# Patient Record
Sex: Female | Born: 1985 | Race: Black or African American | Hispanic: No | Marital: Single | Smoking: Former smoker
Health system: Southern US, Community
[De-identification: ages and names within clinical notes are randomized; demographics above are authoritative.]

## PROBLEM LIST (undated history)

## (undated) DIAGNOSIS — D219 Benign neoplasm of connective and other soft tissue, unspecified: Secondary | ICD-10-CM

## (undated) DIAGNOSIS — J45909 Unspecified asthma, uncomplicated: Secondary | ICD-10-CM

## (undated) DIAGNOSIS — F12188 Cannabis abuse with other cannabis-induced disorder: Secondary | ICD-10-CM

## (undated) DIAGNOSIS — G43909 Migraine, unspecified, not intractable, without status migrainosus: Secondary | ICD-10-CM

## (undated) HISTORY — DX: Benign neoplasm of connective and other soft tissue, unspecified: D21.9

## (undated) HISTORY — PX: ABDOMINAL HYSTERECTOMY: SHX81

## (undated) HISTORY — PX: NO PAST SURGERIES: SHX2092

## (undated) HISTORY — DX: Migraine, unspecified, not intractable, without status migrainosus: G43.909

## (undated) HISTORY — PX: OTHER SURGICAL HISTORY: SHX169

---

## 2014-06-17 ENCOUNTER — Emergency Department (HOSPITAL_COMMUNITY): Payer: Self-pay

## 2014-06-17 ENCOUNTER — Encounter (HOSPITAL_COMMUNITY): Payer: Self-pay | Admitting: Emergency Medicine

## 2014-06-17 ENCOUNTER — Emergency Department (HOSPITAL_COMMUNITY)
Admission: EM | Admit: 2014-06-17 | Discharge: 2014-06-17 | Disposition: A | Discharge status: Home / Self Care | Payer: Self-pay | Attending: Emergency Medicine | Admitting: Emergency Medicine

## 2014-06-17 DIAGNOSIS — D259 Leiomyoma of uterus, unspecified: Secondary | ICD-10-CM | POA: Insufficient documentation

## 2014-06-17 DIAGNOSIS — R112 Nausea with vomiting, unspecified: Secondary | ICD-10-CM

## 2014-06-17 DIAGNOSIS — Z3202 Encounter for pregnancy test, result negative: Secondary | ICD-10-CM | POA: Insufficient documentation

## 2014-06-17 DIAGNOSIS — F1290 Cannabis use, unspecified, uncomplicated: Secondary | ICD-10-CM

## 2014-06-17 DIAGNOSIS — F1210 Cannabis abuse, uncomplicated: Secondary | ICD-10-CM | POA: Insufficient documentation

## 2014-06-17 DIAGNOSIS — K2970 Gastritis, unspecified, without bleeding: Secondary | ICD-10-CM | POA: Insufficient documentation

## 2014-06-17 DIAGNOSIS — Z7289 Other problems related to lifestyle: Secondary | ICD-10-CM

## 2014-06-17 DIAGNOSIS — Z720 Tobacco use: Secondary | ICD-10-CM | POA: Insufficient documentation

## 2014-06-17 DIAGNOSIS — K209 Esophagitis, unspecified without bleeding: Secondary | ICD-10-CM

## 2014-06-17 DIAGNOSIS — Z789 Other specified health status: Secondary | ICD-10-CM

## 2014-06-17 DIAGNOSIS — F1010 Alcohol abuse, uncomplicated: Secondary | ICD-10-CM | POA: Insufficient documentation

## 2014-06-17 LAB — COMPREHENSIVE METABOLIC PANEL
ALT: 18 U/L (ref 0–35)
AST: 27 U/L (ref 0–37)
Albumin: 4.8 g/dL (ref 3.5–5.2)
Alkaline Phosphatase: 54 U/L (ref 39–117)
Anion gap: 9 (ref 5–15)
BUN: 10 mg/dL (ref 6–23)
CO2: 23 mmol/L (ref 19–32)
Calcium: 9.4 mg/dL (ref 8.4–10.5)
Chloride: 106 mEq/L (ref 96–112)
Creatinine, Ser: 0.89 mg/dL (ref 0.50–1.10)
GFR calc Af Amer: >90 mL/min (ref >90)
GFR calc non Af Amer: 88 mL/min — ABNORMAL LOW (ref >90)
Glucose, Bld: 108 mg/dL — ABNORMAL HIGH (ref 70–99)
Potassium: 4.1 mmol/L (ref 3.5–5.1)
Sodium: 138 mmol/L (ref 135–145)
Total Bilirubin: 0.5 mg/dL (ref 0.3–1.2)
Total Protein: 8.7 g/dL — ABNORMAL HIGH (ref 6.0–8.3)

## 2014-06-17 LAB — CBC WITH DIFFERENTIAL/PLATELET
Basophils Absolute: 0 10*3/uL (ref 0.0–0.1)
Basophils Relative: 1 % (ref 0–1)
Eosinophils Absolute: 0.1 10*3/uL (ref 0.0–0.7)
Eosinophils Relative: 1 % (ref 0–5)
HCT: 36.4 % (ref 36.0–46.0)
Hemoglobin: 12.3 g/dL (ref 12.0–15.0)
Lymphocytes Relative: 20 % (ref 12–46)
Lymphs Abs: 1.5 10*3/uL (ref 0.7–4.0)
MCH: 29.8 pg (ref 26.0–34.0)
MCHC: 33.8 g/dL (ref 30.0–36.0)
MCV: 88.1 fL (ref 78.0–100.0)
Monocytes Absolute: 0.6 10*3/uL (ref 0.1–1.0)
Monocytes Relative: 8 % (ref 3–12)
Neutro Abs: 5.3 10*3/uL (ref 1.7–7.7)
Neutrophils Relative %: 70 % (ref 43–77)
Platelets: 394 10*3/uL (ref 150–400)
RBC: 4.13 MIL/uL (ref 3.87–5.11)
RDW: 12.6 % (ref 11.5–15.5)
WBC: 7.5 10*3/uL (ref 4.0–10.5)

## 2014-06-17 LAB — LIPASE, BLOOD: Lipase: 18 U/L (ref 11–59)

## 2014-06-17 LAB — OCCULT BLOOD GASTRIC / DUODENUM (SPECIMEN CUP)
Occult Blood, Gastric: NEGATIVE
pH, Gastric: 2

## 2014-06-17 LAB — POC URINE PREG, ED: Preg Test, Ur: NEGATIVE

## 2014-06-17 MED ORDER — MORPHINE SULFATE 4 MG/ML IJ SOLN
4.0000 mg | Freq: Once | INTRAMUSCULAR | Status: AC
Start: 2014-06-17 — End: 2014-06-17
  Administered 2014-06-17: 4 mg via INTRAVENOUS
  Filled 2014-06-17: qty 1

## 2014-06-17 MED ORDER — METOCLOPRAMIDE HCL 5 MG/ML IJ SOLN
10.0000 mg | Freq: Once | INTRAMUSCULAR | Status: AC
  Administered 2014-06-17: 10 mg via INTRAVENOUS
  Filled 2014-06-17: qty 2

## 2014-06-17 MED ORDER — ONDANSETRON HCL 4 MG/2ML IJ SOLN
4.0000 mg | Freq: Once | INTRAMUSCULAR | Status: AC
  Administered 2014-06-17: 4 mg via INTRAVENOUS
  Filled 2014-06-17: qty 2

## 2014-06-17 MED ORDER — IOHEXOL 300 MG/ML  SOLN
50.0000 mL | Freq: Once | INTRAMUSCULAR | Status: AC | PRN
  Administered 2014-06-17: 50 mL via ORAL

## 2014-06-17 MED ORDER — GI COCKTAIL ~~LOC~~
30.0000 mL | Freq: Once | ORAL | Status: AC
  Administered 2014-06-17: 30 mL via ORAL
  Filled 2014-06-17: qty 30

## 2014-06-17 MED ORDER — HYDROMORPHONE HCL 1 MG/ML IJ SOLN: 1.0000 mg | Freq: Once | INTRAMUSCULAR | Status: DC

## 2014-06-17 MED ORDER — PANTOPRAZOLE SODIUM 40 MG PO TBEC
40.0000 mg | DELAYED_RELEASE_TABLET | Freq: Once | ORAL | Status: AC
  Administered 2014-06-17: 40 mg via ORAL
  Filled 2014-06-17: qty 1

## 2014-06-17 MED ORDER — IOHEXOL 300 MG/ML  SOLN: 50.0000 mL | Freq: Once | INTRAMUSCULAR | Status: DC | PRN

## 2014-06-17 MED ORDER — DIPHENHYDRAMINE HCL 50 MG/ML IJ SOLN
25.0000 mg | Freq: Once | INTRAMUSCULAR | Status: AC
  Administered 2014-06-17: 25 mg via INTRAVENOUS
  Filled 2014-06-17: qty 1

## 2014-06-17 MED ORDER — PROMETHAZINE HCL 25 MG PO TABS: 25.0000 mg | ORAL_TABLET | Freq: Four times a day (QID) | ORAL | Status: DC | PRN

## 2014-06-17 MED ORDER — TRAMADOL HCL 50 MG PO TABS: 50.0000 mg | ORAL_TABLET | Freq: Four times a day (QID) | ORAL | Status: DC | PRN

## 2014-06-17 MED ORDER — HYDROMORPHONE HCL 1 MG/ML IJ SOLN
1.0000 mg | Freq: Once | INTRAMUSCULAR | Status: AC
  Administered 2014-06-17: 1 mg via INTRAVENOUS
  Filled 2014-06-17: qty 1

## 2014-06-17 MED ORDER — IOHEXOL 300 MG/ML  SOLN
100.0000 mL | Freq: Once | INTRAMUSCULAR | Status: AC | PRN
  Administered 2014-06-17: 100 mL via INTRAVENOUS

## 2014-06-17 MED ORDER — FAMOTIDINE 20 MG PO TABS: 20.0000 mg | ORAL_TABLET | Freq: Two times a day (BID) | ORAL | Status: DC

## 2014-06-17 NOTE — ED Notes (Signed)
Per EMS: pt c/o LUQ abd pain, n/v x 5 hours, per pt stating dark red blood was noted in last couple of episodes. 20 g right hand 4 mg Zofran in route.

## 2014-06-17 NOTE — ED Provider Notes (Signed)
CSN: [id number]     Arrival date & time 06/17/14  1022 History   First MD Initiated Contact with Patient 06/17/14 1036     Chief Complaint  Patient presents with  . Abdominal Pain  . Nausea  . Vomiting     (Consider location/radiation/quality/duration/timing/severity/associated sxs/prior Treatment) HPI  Pt is a 28yo female presenting to ED via EMS from home with c/o LUQ pain that radiates throughout left side of abdomen, associated with nausea and vomiting x5 hours.  Pt states pain is constant, sharp and cramping, 7/10 at worst but causes pt to be tearful. Pt reports vomiting dark red blood in last few episodes of emesis. Has had over 10 episodes of vomiting. Denies diarrhea. Denies fever but reports chills and  Decreased appetite.  Pt was given 4mg  IV zofran PTA, which has improved her nausea. Denies hx of similar symptoms. Denies hx of abdominal surgeries. Pt does report drinking alcohol last night but states she had "just 1 shot" and smokes marijuana daily.   Denies urinary or vaginal symptoms.   History reviewed. No pertinent past medical history. History reviewed. No pertinent past surgical history. No family history on file. History  Substance Use Topics  . Smoking status: Current Every Day Smoker  . Smokeless tobacco: Not on file  . Alcohol Use: Yes   OB History    No data available     Review of Systems  Constitutional: Positive for chills and appetite change. Negative for fever, diaphoresis and fatigue.  Gastrointestinal: Positive for nausea, vomiting and abdominal pain. Negative for diarrhea and constipation.  All other systems reviewed and are negative.     Allergies  Shrimp  Home Medications   Prior to Admission medications   Medication Sig Start Date End Date Taking? Authorizing Provider  famotidine (PEPCID) 20 MG tablet Take 1 tablet (20 mg total) by mouth 2 (two) times daily. For up to 12 weeks 06/17/14   Noland Fordyce, PA-C  promethazine (PHENERGAN) 25 MG  tablet Take 1 tablet (25 mg total) by mouth every 6 (six) hours as needed for nausea or vomiting. 06/17/14   Noland Fordyce, PA-C  traMADol (ULTRAM) 50 MG tablet Take 1 tablet (50 mg total) by mouth every 6 (six) hours as needed. 06/17/14   Noland Fordyce, PA-C   BP 111/56 mmHg  Pulse 66  Temp(Src) 98.5 F (36.9 C) (Oral)  Resp 16  SpO2 98%  LMP 06/13/2014 (Exact Date) Physical Exam  Constitutional: She appears well-developed and well-nourished. She appears distressed.  Pt lying on right side in fetal position, holding left side of abdomen, pt tearful. Appears in severe pain.  HENT:  Head: Normocephalic and atraumatic.  Eyes: Conjunctivae are normal. No scleral icterus.  Neck: Normal range of motion.  Cardiovascular: Normal rate, regular rhythm and normal heart sounds.   Pulmonary/Chest: Effort normal and breath sounds normal. No respiratory distress. She has no wheezes. She has no rales. She exhibits no tenderness.  Abdominal: Soft. Bowel sounds are normal. She exhibits no distension and no mass. There is tenderness. There is rebound and guarding. There is no CVA tenderness.  Soft, non-distended, tenderness to left side of abdomen into suprapubic region   Musculoskeletal: Normal range of motion.  Neurological: She is alert.  Skin: Skin is warm and dry. She is not diaphoretic.  Nursing note and vitals reviewed.   ED Course  Procedures (including critical care time) Labs Review Labs Reviewed  COMPREHENSIVE METABOLIC PANEL - Abnormal; Notable for the following:  Glucose, Bld 108 (*)    Total Protein 8.7 (*)    GFR calc non Af Amer 88 (*)    All other components within normal limits  CBC WITH DIFFERENTIAL  LIPASE, BLOOD  OCCULT BLOOD GASTRIC / DUODENUM (SPECIMEN CUP)  POC URINE PREG, ED    Imaging Review Ct Abdomen Pelvis W Contrast  06/17/2014   CLINICAL DATA:  Left upper quadrant abdominal pain, nausea, vomiting. Symptoms for 5 hours.  EXAM: CT ABDOMEN AND PELVIS WITH  CONTRAST  TECHNIQUE: Multidetector CT imaging of the abdomen and pelvis was performed using the standard protocol following bolus administration of intravenous contrast.  CONTRAST:  161mL OMNIPAQUE IOHEXOL 300 MG/ML  SOLN  COMPARISON:  None.  FINDINGS: The included lung bases are clear.  The liver, spleen, pancreas, adrenal glands, and kidneys appear normal. Small splenule noted adjacent to the inferior spleen. The gallbladder is physiologically distended, questionable sludge dependently. No calcified stones. No pericholecystic edema.  Equivocal wall thickening at the gastroesophageal junction. Stomach is otherwise physiologically distended with ingested enteric contrast. There are no dilated or thickened bowel loops. The appendix is normal. No free air, free fluid, or intra-abdominal fluid collection. Small fat containing periumbilical hernia.  The abdominal aorta is normal in caliber. There is no retroperitoneal adenopathy.  Within the pelvis, the uterus is mildly enlarged with an anterior fundal 3.3 x 2.5 centimeter fibroid. There is a probable partially exophytic posterior fundal fibroid measuring 2.7 x 2.7 cm. No adnexal mass. No significant pelvic free fluid. The urinary bladder is decompressed.  The osseous structures are normal.  IMPRESSION: 1. Equivocal thickening at the gastroesophageal junction, otherwise no acute abnormality in the abdomen/pelvis. This may reflect nondistention versus esophagitis. 2. Mildly enlarged uterus with uterine fibroids. 3. Question sludge in the gallbladder.   Electronically Signed   By: Jeb Levering M.D.   On: 06/17/2014 12:43     EKG Interpretation None      MDM   Final diagnoses:  Gastritis  Esophagitis  Nausea and vomiting in adult patient  Alcohol use  Marijuana use  Uterine leiomyoma, unspecified location   Pt is a 28yo female presenting to ED c/o n/v and abdominal pain that started this morning. Associated with a few episodes of hematemesis.  Pt  appears uncomfortable on exam, severe tenderness in epigastrium and LUQ radiating down into left side of abdomen. Pt denies urinary or vaginal symptoms.  CT ordered due to severity of pain.  Pt is afebrile. No vomiting after pt given zofran from EMS PTA.  12:52 PM CT abd: significant for equivocal thickening at the gastroesophageal junction, otherwise no acute abnormality of abdomen/pelvis.  Mildly enlarged uterus with uterine fibroids. Question sludge in gallbladder.    Labs: unremarkable, LFTs are not elevated  12:59 PM Discussed imaging with pt, pt had 1 episode of blood tinged emesis after CT contrast consumed, otherwise, no addition episodes. Pt still c/o severe LUQ and left sided abdominal pain.  Tearful. States pain medication, morphine did not help pain.   Discussed pt with Dr. Leonides Schanz, agrees with plan to tx pt for esophagitis, gastritis. No evidence of emergent process taking place at this time.   2:01 PM after given GI cocktail as well as protonix, pt still c/o severe LUQ pain. States none of the pain medication has helped. No vomiting as nausea has resolved.   2:25 PM Pt received dilaudid IV, RN states pt gave her a "thumbs up" after medication was given. Due to symptoms likely being due  to gastritis, will also give reglan to help with pain and nausea. As pt has been able to keep down PO fluids in ED, will discharge home with phenergan, pepcid, and tramadol. Will advise against NSAIDs until symptoms resolve. Also advised against consumptoms of etoh and marijuana as both may exacerbate her symptoms. Encouraged pt to f/u with PCP for recheck of symptoms in 2-3 days if not improving. Return precautions provided. Pt verbalized understanding and agreement with tx plan.     Noland Fordyce, PA-C 06/17/14 [place], DO 06/17/14 1550

## 2014-06-17 NOTE — ED Notes (Signed)
Bed: WA09 Expected date:  Expected time:  Means of arrival:  Comments: EMS- abdominal pain, n/v/d

## 2014-06-17 NOTE — ED Notes (Signed)
Given pt a cup of ice water.

## 2014-07-13 ENCOUNTER — Encounter (HOSPITAL_COMMUNITY): Payer: Self-pay | Admitting: *Deleted

## 2014-07-13 ENCOUNTER — Emergency Department (HOSPITAL_COMMUNITY)
Admission: EM | Admit: 2014-07-13 | Discharge: 2014-07-13 | Disposition: A | Discharge status: Home / Self Care | Payer: Self-pay | Attending: Emergency Medicine | Admitting: Emergency Medicine

## 2014-07-13 DIAGNOSIS — Z79899 Other long term (current) drug therapy: Secondary | ICD-10-CM | POA: Insufficient documentation

## 2014-07-13 DIAGNOSIS — Z720 Tobacco use: Secondary | ICD-10-CM | POA: Insufficient documentation

## 2014-07-13 DIAGNOSIS — R1084 Generalized abdominal pain: Secondary | ICD-10-CM | POA: Insufficient documentation

## 2014-07-13 DIAGNOSIS — R112 Nausea with vomiting, unspecified: Secondary | ICD-10-CM | POA: Insufficient documentation

## 2014-07-13 DIAGNOSIS — R197 Diarrhea, unspecified: Secondary | ICD-10-CM | POA: Insufficient documentation

## 2014-07-13 DIAGNOSIS — Z3202 Encounter for pregnancy test, result negative: Secondary | ICD-10-CM | POA: Insufficient documentation

## 2014-07-13 LAB — COMPREHENSIVE METABOLIC PANEL
ALBUMIN: 4.7 g/dL (ref 3.5–5.2)
ALK PHOS: 54 U/L (ref 39–117)
ALT: 24 U/L (ref 0–35)
AST: 38 U/L — AB (ref 0–37)
Anion gap: 9 (ref 5–15)
BUN: 11 mg/dL (ref 6–23)
CO2: 22 mmol/L (ref 19–32)
CREATININE: 0.96 mg/dL (ref 0.50–1.10)
Calcium: 9.6 mg/dL (ref 8.4–10.5)
Chloride: 111 mEq/L (ref 96–112)
GFR, EST NON AFRICAN AMERICAN: 80 mL/min — AB (ref >90)
Glucose, Bld: 126 mg/dL — ABNORMAL HIGH (ref 70–99)
Potassium: 3.8 mmol/L (ref 3.5–5.1)
SODIUM: 142 mmol/L (ref 135–145)
Total Bilirubin: 0.7 mg/dL (ref 0.3–1.2)
Total Protein: 9 g/dL — ABNORMAL HIGH (ref 6.0–8.3)

## 2014-07-13 LAB — URINALYSIS, ROUTINE W REFLEX MICROSCOPIC
Bilirubin Urine: NEGATIVE
Glucose, UA: NEGATIVE mg/dL
Ketones, ur: 40 mg/dL — AB
Leukocytes, UA: NEGATIVE
NITRITE: NEGATIVE
Protein, ur: 30 mg/dL — AB
Specific Gravity, Urine: 1.027 (ref 1.005–1.030)
Urobilinogen, UA: 0.2 mg/dL (ref 0.0–1.0)
pH: 6 (ref 5.0–8.0)

## 2014-07-13 LAB — URINE MICROSCOPIC-ADD ON

## 2014-07-13 LAB — CBC WITH DIFFERENTIAL/PLATELET
Basophils Absolute: 0 10*3/uL (ref 0.0–0.1)
Basophils Relative: 0 % (ref 0–1)
EOS PCT: 0 % (ref 0–5)
Eosinophils Absolute: 0 10*3/uL (ref 0.0–0.7)
HEMATOCRIT: 36.9 % (ref 36.0–46.0)
Hemoglobin: 12.5 g/dL (ref 12.0–15.0)
LYMPHS PCT: 12 % (ref 12–46)
Lymphs Abs: 1.2 10*3/uL (ref 0.7–4.0)
MCH: 29.6 pg (ref 26.0–34.0)
MCHC: 33.9 g/dL (ref 30.0–36.0)
MCV: 87.4 fL (ref 78.0–100.0)
MONO ABS: 0.5 10*3/uL (ref 0.1–1.0)
Monocytes Relative: 5 % (ref 3–12)
NEUTROS PCT: 83 % — AB (ref 43–77)
Neutro Abs: 8.6 10*3/uL — ABNORMAL HIGH (ref 1.7–7.7)
Platelets: 372 10*3/uL (ref 150–400)
RBC: 4.22 MIL/uL (ref 3.87–5.11)
RDW: 12.6 % (ref 11.5–15.5)
WBC: 10.3 10*3/uL (ref 4.0–10.5)

## 2014-07-13 LAB — POC URINE PREG, ED: PREG TEST UR: NEGATIVE

## 2014-07-13 LAB — LIPASE, BLOOD: Lipase: 21 U/L (ref 11–59)

## 2014-07-13 MED ORDER — HYDROCODONE-ACETAMINOPHEN 5-325 MG PO TABS: 1.0000 | ORAL_TABLET | Freq: Four times a day (QID) | ORAL | Status: DC | PRN

## 2014-07-13 MED ORDER — LORAZEPAM 2 MG/ML IJ SOLN
1.0000 mg | Freq: Once | INTRAMUSCULAR | Status: AC
  Administered 2014-07-13: 1 mg via INTRAVENOUS
  Filled 2014-07-13: qty 1

## 2014-07-13 MED ORDER — SODIUM CHLORIDE 0.9 % IV BOLUS (SEPSIS)
1000.0000 mL | Freq: Once | INTRAVENOUS | Status: AC
  Administered 2014-07-13: 1000 mL via INTRAVENOUS

## 2014-07-13 MED ORDER — ONDANSETRON HCL 4 MG/2ML IJ SOLN
4.0000 mg | Freq: Once | INTRAMUSCULAR | Status: AC
  Administered 2014-07-13: 4 mg via INTRAVENOUS
  Filled 2014-07-13: qty 2

## 2014-07-13 MED ORDER — HYDROMORPHONE HCL 1 MG/ML IJ SOLN
1.0000 mg | Freq: Once | INTRAMUSCULAR | Status: AC
  Administered 2014-07-13: 1 mg via INTRAVENOUS
  Filled 2014-07-13: qty 1

## 2014-07-13 MED ORDER — HYDROMORPHONE HCL 1 MG/ML IJ SOLN
1.0000 mg | Freq: Once | INTRAMUSCULAR | Status: AC
Start: 2014-07-13 — End: 2014-07-13
  Administered 2014-07-13: 1 mg via INTRAVENOUS
  Filled 2014-07-13: qty 1

## 2014-07-13 NOTE — ED Notes (Signed)
Pt reports n/v/d this am, severe abd pain. Seen in Dec for similar symptoms.

## 2014-07-13 NOTE — ED Provider Notes (Signed)
CSN: [id number]     Arrival date & time 07/13/14  1352 History   First MD Initiated Contact with Patient 07/13/14 1504     Chief Complaint  Patient presents with  . Nausea  . Emesis  . Abdominal Pain  . Diarrhea     (Consider location/radiation/quality/duration/timing/severity/associated sxs/prior Treatment) Patient is a 29 y.o. female presenting with vomiting, abdominal pain, and diarrhea. The history is provided by the patient.  Emesis Severity:  Mild Timing:  Constant Number of daily episodes:  2 Quality:  Stomach contents Progression:  Worsening Chronicity:  Recurrent (same thing happened last month around her menstrual cycle) Recent urination:  Normal Relieved by:  Nothing Worsened by:  Nothing tried Associated symptoms: abdominal pain and diarrhea   Associated symptoms: no cough   Risk factors: no alcohol use, no diabetes and not pregnant now   Abdominal Pain Associated symptoms: diarrhea and vomiting   Associated symptoms: no chest pain, no cough, no fever and no shortness of breath   Diarrhea Associated symptoms: abdominal pain and vomiting   Associated symptoms: no recent cough and no fever     History reviewed. No pertinent past medical history. History reviewed. No pertinent past surgical history. No family history on file. History  Substance Use Topics  . Smoking status: Current Every Day Smoker  . Smokeless tobacco: Not on file  . Alcohol Use: Yes     Comment: 5 shots liquor last night   OB History    No data available     Review of Systems  Constitutional: Negative for fever.  Respiratory: Negative for cough and shortness of breath.   Cardiovascular: Negative for chest pain and leg swelling.  Gastrointestinal: Positive for vomiting, abdominal pain and diarrhea.  All other systems reviewed and are negative.     Allergies  Shrimp  Home Medications   Prior to Admission medications   Medication Sig Start Date End Date Taking? Authorizing  Provider  famotidine (PEPCID) 20 MG tablet Take 1 tablet (20 mg total) by mouth 2 (two) times daily. For up to 12 weeks 06/17/14   Noland Fordyce, PA-C  promethazine (PHENERGAN) 25 MG tablet Take 1 tablet (25 mg total) by mouth every 6 (six) hours as needed for nausea or vomiting. 06/17/14   Noland Fordyce, PA-C  traMADol (ULTRAM) 50 MG tablet Take 1 tablet (50 mg total) by mouth every 6 (six) hours as needed. 06/17/14   Noland Fordyce, PA-C   BP 132/87 mmHg  Pulse 86  Temp(Src) 97.5 F (36.4 C) (Oral)  Resp 26  SpO2 98%  LMP 06/10/2014 Physical Exam  Constitutional: She is oriented to person, place, and time. She appears well-developed and well-nourished. No distress.  HENT:  Head: Normocephalic and atraumatic.  Mouth/Throat: Oropharynx is clear and moist.  Eyes: EOM are normal. Pupils are equal, round, and reactive to light.  Neck: Normal range of motion. Neck supple.  Cardiovascular: Normal rate and regular rhythm.  Exam reveals no friction rub.   No murmur heard. Pulmonary/Chest: Effort normal and breath sounds normal. No respiratory distress. She has no wheezes. She has no rales.  Abdominal: Soft. She exhibits no distension. There is tenderness (diffusely). There is guarding (diffusely). There is no rebound.  Musculoskeletal: Normal range of motion. She exhibits no edema.  Neurological: She is alert and oriented to person, place, and time.  Skin: She is not diaphoretic.  Nursing note and vitals reviewed.   ED Course  Procedures (including critical care time) Labs Review Labs Reviewed  CBC WITH DIFFERENTIAL - Abnormal; Notable for the following:    Neutrophils Relative % 83 (*)    Neutro Abs 8.6 (*)    All other components within normal limits  COMPREHENSIVE METABOLIC PANEL - Abnormal; Notable for the following:    Glucose, Bld 126 (*)    Total Protein 9.0 (*)    AST 38 (*)    GFR calc non Af Amer 80 (*)    All other components within normal limits  LIPASE, BLOOD   URINALYSIS, ROUTINE W REFLEX MICROSCOPIC  POC URINE PREG, ED    Imaging Review No results found.   EKG Interpretation None      MDM   Final diagnoses:  Generalized abdominal pain  Non-intractable vomiting with nausea, vomiting of unspecified type    29 year old female presents with abdominal pain, nausea, vomiting, diarrhea. Began this morning. Similar to prior episode last month which began on her period. Her period began 3 days ago and should've ended yesterday but is still continuing now. No fever. Began with diarrhea, notes or nausea and vomiting. Has a lot of abdominal pain. She states this is exact same as she had last month. CT at that time showed thickening of the esophageal junction, mild sludge in the gallbladder, and mild uterine fibroids. Here he has she has stable vitals. She rips my hand away aggressively 1 push on her abdomen. She states is the exact same as last time, will start with IV hydration and pain meds. She has a normal white count, normal LFTs, normal bilirubin, normal lipase. Patient sleeping comfortably, well appearing. Since she recently had this last month with a normal scan, I do not feel inclined to scan her today. I gave her pain meds, she had phenergan at home. Stable for discharge.  Evelina Bucy, MD 07/13/14 1754

## 2014-07-13 NOTE — ED Notes (Signed)
Pt appears to be hyperventilating.  Vitals are stable.  Placed IV fluids and gave zofran.  Attempted to get patient to calm breathing.

## 2014-07-13 NOTE — Discharge Instructions (Signed)
Abdominal Pain, Women °Abdominal (stomach, pelvic, or belly) pain can be caused by many things. It is important to tell your doctor: °· The location of the pain. °· Does it come and go or is it present all the time? °· Are there things that start the pain (eating certain foods, exercise)? °· Are there other symptoms associated with the pain (fever, nausea, vomiting, diarrhea)? °All of this is helpful to know when trying to find the cause of the pain. °CAUSES  °· Stomach: virus or bacteria infection, or ulcer. °· Intestine: appendicitis (inflamed appendix), regional ileitis (Crohn's disease), ulcerative colitis (inflamed colon), irritable bowel syndrome, diverticulitis (inflamed diverticulum of the colon), or cancer of the stomach or intestine. °· Gallbladder disease or stones in the gallbladder. °· Kidney disease, kidney stones, or infection. °· Pancreas infection or cancer. °· Fibromyalgia (pain disorder). °· Diseases of the female organs: °¨ Uterus: fibroid (non-cancerous) tumors or infection. °¨ Fallopian tubes: infection or tubal pregnancy. °¨ Ovary: cysts or tumors. °¨ Pelvic adhesions (scar tissue). °¨ Endometriosis (uterus lining tissue growing in the pelvis and on the pelvic organs). °¨ Pelvic congestion syndrome (female organs filling up with blood just before the menstrual period). °¨ Pain with the menstrual period. °¨ Pain with ovulation (producing an egg). °¨ Pain with an IUD (intrauterine device, birth control) in the uterus. °¨ Cancer of the female organs. °· Functional pain (pain not caused by a disease, may improve without treatment). °· Psychological pain. °· Depression. °DIAGNOSIS  °Your doctor will decide the seriousness of your pain by doing an examination. °· Blood tests. °· X-rays. °· Ultrasound. °· CT scan (computed tomography, special type of X-ray). °· MRI (magnetic resonance imaging). °· Cultures, for infection. °· Barium enema (dye inserted in the large intestine, to better view it with  X-rays). °· Colonoscopy (looking in intestine with a lighted tube). °· Laparoscopy (minor surgery, looking in abdomen with a lighted tube). °· Major abdominal exploratory surgery (looking in abdomen with a large incision). °TREATMENT  °The treatment will depend on the cause of the pain.  °· Many cases can be observed and treated at home. °· Over-the-counter medicines recommended by your caregiver. °· Prescription medicine. °· Antibiotics, for infection. °· Birth control pills, for painful periods or for ovulation pain. °· Hormone treatment, for endometriosis. °· Nerve blocking injections. °· Physical therapy. °· Antidepressants. °· Counseling with a psychologist or psychiatrist. °· Minor or major surgery. °HOME CARE INSTRUCTIONS  °· Do not take laxatives, unless directed by your caregiver. °· Take over-the-counter pain medicine only if ordered by your caregiver. Do not take aspirin because it can cause an upset stomach or bleeding. °· Try a clear liquid diet (broth or water) as ordered by your caregiver. Slowly move to a bland diet, as tolerated, if the pain is related to the stomach or intestine. °· Have a thermometer and take your temperature several times a day, and record it. °· Bed rest and sleep, if it helps the pain. °· Avoid sexual intercourse, if it causes pain. °· Avoid stressful situations. °· Keep your follow-up appointments and tests, as your caregiver orders. °· If the pain does not go away with medicine or surgery, you may try: °¨ Acupuncture. °¨ Relaxation exercises (yoga, meditation). °¨ Group therapy. °¨ Counseling. °SEEK MEDICAL CARE IF:  °· You notice certain foods cause stomach pain. °· Your home care treatment is not helping your pain. °· You need stronger pain medicine. °· You want your IUD removed. °· You feel faint or   lightheaded. °· You develop nausea and vomiting. °· You develop a rash. °· You are having side effects or an allergy to your medicine. °SEEK IMMEDIATE MEDICAL CARE IF:  °· Your  pain does not go away or gets worse. °· You have a fever. °· Your pain is felt only in portions of the abdomen. The right side could possibly be appendicitis. The left lower portion of the abdomen could be colitis or diverticulitis. °· You are passing blood in your stools (bright red or black tarry stools, with or without vomiting). °· You have blood in your urine. °· You develop chills, with or without a fever. °· You pass out. °MAKE SURE YOU:  °· Understand these instructions. °· Will watch your condition. °· Will get help right away if you are not doing well or get worse. °Document Released: 04/10/2007 Document Revised: 10/28/2013 Document Reviewed: 04/30/2009 °[employer]® Patient Information ©2015 [employer]. This information is not intended to replace advice given to you by your health care provider. Make sure you discuss any questions you have with your health care provider. ° °

## 2014-07-13 NOTE — ED Notes (Signed)
Patient aware that a urine sample is needed. Patient is unable to provide a sample at this time, patient will let staff know when they are able to provide a sample.

## 2014-09-21 ENCOUNTER — Emergency Department (HOSPITAL_COMMUNITY)
Admission: EM | Admit: 2014-09-21 | Discharge: 2014-09-21 | Disposition: A | Discharge status: Home / Self Care | Payer: Self-pay | Attending: Emergency Medicine | Admitting: Emergency Medicine

## 2014-09-21 ENCOUNTER — Encounter (HOSPITAL_COMMUNITY): Payer: Self-pay | Admitting: Emergency Medicine

## 2014-09-21 DIAGNOSIS — Z79899 Other long term (current) drug therapy: Secondary | ICD-10-CM | POA: Insufficient documentation

## 2014-09-21 DIAGNOSIS — Z720 Tobacco use: Secondary | ICD-10-CM | POA: Insufficient documentation

## 2014-09-21 DIAGNOSIS — H00016 Hordeolum externum left eye, unspecified eyelid: Secondary | ICD-10-CM | POA: Insufficient documentation

## 2014-09-21 NOTE — ED Notes (Signed)
Pt c/o lt eye stye x 2 days.  Denies other complaint.

## 2014-09-21 NOTE — Discharge Instructions (Signed)
Apply warm compresses intermittently throughout the day. Follow-up at the wellness clinic to establish care with a primary care physician.  Sty A sty (hordeolum) is an infection of a gland in the eyelid located at the base of the eyelash. A sty may develop a white or yellow head of pus. It can be puffy (swollen). Usually, the sty will burst and pus will come out on its own. They do not leave lumps in the eyelid once they drain. A sty is often confused with another form of cyst of the eyelid called a chalazion. Chalazions occur within the eyelid and not on the edge where the bases of the eyelashes are. They often are red, sore and then form firm lumps in the eyelid. CAUSES   Germs (bacteria).  Lasting (chronic) eyelid inflammation. SYMPTOMS   Tenderness, redness and swelling along the edge of the eyelid at the base of the eyelashes.  Sometimes, there is a white or yellow head of pus. It may or may not drain. DIAGNOSIS  An ophthalmologist will be able to distinguish between a sty and a chalazion and treat the condition appropriately.  TREATMENT   Styes are typically treated with warm packs (compresses) until drainage occurs.  In rare cases, medicines that kill germs (antibiotics) may be prescribed. These antibiotics may be in the form of drops, cream or pills.  If a hard lump has formed, it is generally necessary to do a small incision and remove the hardened contents of the cyst in a minor surgical procedure done in the office.  In suspicious cases, your caregiver may send the contents of the cyst to the lab to be certain that it is not a rare, but dangerous form of cancer of the glands of the eyelid. HOME CARE INSTRUCTIONS   Wash your hands often and dry them with a clean towel. Avoid touching your eyelid. This may spread the infection to other parts of the eye.  Apply heat to your eyelid for 10 to 20 minutes, several times a day, to ease pain and help to heal it faster.  Do not  squeeze the sty. Allow it to drain on its own. Wash your eyelid carefully 3 to 4 times per day to remove any pus. SEEK IMMEDIATE MEDICAL CARE IF:   Your eye becomes painful or puffy (swollen).  Your vision changes.  Your sty does not drain by itself within 3 days.  Your sty comes back within a short period of time, even with treatment.  You have redness (inflammation) around the eye.  You have a fever. Document Released: 03/23/2005 Document Revised: 09/05/2011 Document Reviewed: 09/27/2013 [facility] Patient Information 2015 [place], [place]. This information is not intended to replace advice given to you by your health care provider. Make sure you discuss any questions you have with your health care provider.

## 2014-09-21 NOTE — ED Provider Notes (Signed)
CSN: [id number]     Arrival date & time 09/21/14  1437 History   First MD Initiated Contact with Patient 09/21/14 1448     Chief Complaint  Patient presents with  . Eye Problem     (Consider location/radiation/quality/duration/timing/severity/associated sxs/prior Treatment) HPI Comments: 29 year old female presenting with swelling to her left eyelid 2 days, increasing in size today. Patient reports it is very painful and feels like there is something in her eyelid. States there was some drainage earlier in the day. Denies vision change. Does not wear contacts. She tried applying warm compresses with minimal relief.  Patient is a 29 y.o. female presenting with eye problem. The history is provided by the patient.  Eye Problem   History reviewed. No pertinent past medical history. No past surgical history on file. No family history on file. History  Substance Use Topics  . Smoking status: Current Every Day Smoker  . Smokeless tobacco: Not on file  . Alcohol Use: Yes     Comment: 5 shots liquor last night   OB History    No data available     Review of Systems  Eyes:       +Eyelid swelling.  All other systems reviewed and are negative.     Allergies  Shrimp  Home Medications   Prior to Admission medications   Medication Sig Start Date End Date Taking? Authorizing Provider  famotidine (PEPCID) 20 MG tablet Take 1 tablet (20 mg total) by mouth 2 (two) times daily. For up to 12 weeks 06/17/14   Noland Fordyce, PA-C  HYDROcodone-acetaminophen (NORCO/VICODIN) 5-325 MG per tablet Take 1 tablet by mouth every 6 (six) hours as needed for moderate pain. 07/13/14   Evelina Bucy, MD  promethazine (PHENERGAN) 25 MG tablet Take 1 tablet (25 mg total) by mouth every 6 (six) hours as needed for nausea or vomiting. 06/17/14   Noland Fordyce, PA-C  traMADol (ULTRAM) 50 MG tablet Take 1 tablet (50 mg total) by mouth every 6 (six) hours as needed. 06/17/14   Noland Fordyce, PA-C   BP 131/77  mmHg  Pulse 74  Temp(Src) 99.2 F (37.3 C) (Oral)  Resp 18  SpO2 100% Physical Exam  Constitutional: She is oriented to person, place, and time. She appears well-developed and well-nourished. No distress.  HENT:  Head: Normocephalic and atraumatic.  Mouth/Throat: Oropharynx is clear and moist.  Eyes: Conjunctivae and EOM are normal. Left eye exhibits hordeolum.  Neck: Normal range of motion. Neck supple.  Cardiovascular: Normal rate, regular rhythm and normal heart sounds.   Pulmonary/Chest: Effort normal and breath sounds normal. No respiratory distress.  Musculoskeletal: Normal range of motion. She exhibits no edema.  Neurological: She is alert and oriented to person, place, and time. No sensory deficit.  Skin: Skin is warm and dry.  Psychiatric: She has a normal mood and affect. Her behavior is normal.  Nursing note and vitals reviewed.   ED Course  Procedures (including critical care time) Labs Review Labs Reviewed - No data to display  Imaging Review No results found.   EKG Interpretation None      MDM   Final diagnoses:  External hordeolum, left   NAD. VSS. Reassurance given. Advised warm compresses. Stable for discharge. Resources given for follow-up. Return precautions given. Patient states understanding of treatment care plan and is agreeable.  Carman Ching, PA-C 09/21/14 1515  Debby Freiberg, MD 09/22/14 1600

## 2014-11-03 ENCOUNTER — Encounter (HOSPITAL_COMMUNITY): Payer: Self-pay

## 2014-11-03 ENCOUNTER — Emergency Department (HOSPITAL_COMMUNITY)
Admission: EM | Admit: 2014-11-03 | Discharge: 2014-11-03 | Disposition: A | Discharge status: Home / Self Care | Payer: Self-pay | Attending: Emergency Medicine | Admitting: Emergency Medicine

## 2014-11-03 DIAGNOSIS — Z720 Tobacco use: Secondary | ICD-10-CM | POA: Insufficient documentation

## 2014-11-03 DIAGNOSIS — T7840XA Allergy, unspecified, initial encounter: Secondary | ICD-10-CM | POA: Insufficient documentation

## 2014-11-03 DIAGNOSIS — Z79899 Other long term (current) drug therapy: Secondary | ICD-10-CM | POA: Insufficient documentation

## 2014-11-03 DIAGNOSIS — R220 Localized swelling, mass and lump, head: Secondary | ICD-10-CM | POA: Insufficient documentation

## 2014-11-03 MED ORDER — FAMOTIDINE 20 MG PO TABS
40.0000 mg | ORAL_TABLET | Freq: Once | ORAL | Status: AC
  Administered 2014-11-03: 40 mg via ORAL
  Filled 2014-11-03: qty 2

## 2014-11-03 MED ORDER — PREDNISONE 20 MG PO TABS
60.0000 mg | ORAL_TABLET | Freq: Once | ORAL | Status: AC
  Administered 2014-11-03: 60 mg via ORAL
  Filled 2014-11-03: qty 3

## 2014-11-03 MED ORDER — PREDNISONE 20 MG PO TABS: 40.0000 mg | ORAL_TABLET | Freq: Every day | ORAL | Status: DC

## 2014-11-03 MED ORDER — DIPHENHYDRAMINE HCL 25 MG PO CAPS
50.0000 mg | ORAL_CAPSULE | Freq: Once | ORAL | Status: AC
  Administered 2014-11-03: 50 mg via ORAL
  Filled 2014-11-03: qty 2

## 2014-11-03 NOTE — ED Notes (Addendum)
Pt c/o possible allergic reaction, facial swelling, difficulty swallowing, and generalized itching x 2 days.  Denies pain.  Pt reports waking up w/ symptoms yesterday.  Pt took Benadryl yesterday w/o relief.  Denies new lotions, shampoos, soaps, or medications.  Pt is allergic to shrimp, but has not been around seafood.  NAD noted.  Pt speaking full sentences.

## 2014-11-03 NOTE — ED Provider Notes (Signed)
CSN: [id number]     Arrival date & time 11/03/14  0935 History   First MD Initiated Contact with Patient 11/03/14 [phone]     Chief Complaint  Patient presents with  . Allergic Reaction  . Facial Swelling      HPI  Pt was seen at 1000. Per pt, c/o gradual onset and persistence of constant "itching all over" for the past 2 to 3 days. Pt states she "woke up today" with her face "swollen." Pt took benadryl yesterday with "some" relief. Denies dysphagia, no intra-oral edema, no hoarse voice, no drooling/stridor, no CP/SOB, no wheezing, no abd pain, no N/V/D.    History reviewed. No pertinent past medical history.   History reviewed. No pertinent past surgical history.  History  Substance Use Topics  . Smoking status: Current Some Day Smoker    Types: Cigarettes  . Smokeless tobacco: Not on file  . Alcohol Use: Yes     Comment: 5 shots liquor last night    Review of Systems ROS: Statement: All systems negative except as marked or noted in the HPI; Constitutional: Negative for fever and chills. ; ; Eyes: Negative for eye pain, redness and discharge. ; ; ENMT: Negative for ear pain, hoarseness, nasal congestion, sinus pressure and sore throat. ; ; Cardiovascular: Negative for chest pain, palpitations, diaphoresis, dyspnea and peripheral edema. ; ; Respiratory: Negative for cough, wheezing and stridor. ; ; Gastrointestinal: Negative for nausea, vomiting, diarrhea, abdominal pain, blood in stool, hematemesis, jaundice and rectal bleeding. . ; ; Genitourinary: Negative for dysuria, flank pain and hematuria. ; ; Musculoskeletal: Negative for back pain and neck pain. Negative for swelling and trauma.; ; Skin: +itching rash. Negative for abrasions, blisters, bruising and skin lesion.; ; Neuro: Negative for headache, lightheadedness and neck stiffness. Negative for weakness, altered level of consciousness , altered mental status, extremity weakness, paresthesias, involuntary movement, seizure and syncope.       Allergies  Shrimp  Home Medications   Prior to Admission medications   Medication Sig Start Date End Date Taking? Authorizing Provider  famotidine (PEPCID) 20 MG tablet Take 1 tablet (20 mg total) by mouth 2 (two) times daily. For up to 12 weeks Patient not taking: Reported on 11/03/2014 06/17/14   Noland Fordyce, PA-C  HYDROcodone-acetaminophen (NORCO/VICODIN) 5-325 MG per tablet Take 1 tablet by mouth every 6 (six) hours as needed for moderate pain. Patient not taking: Reported on 11/03/2014 07/13/14   Evelina Bucy, MD  promethazine (PHENERGAN) 25 MG tablet Take 1 tablet (25 mg total) by mouth every 6 (six) hours as needed for nausea or vomiting. Patient not taking: Reported on 11/03/2014 06/17/14   Noland Fordyce, PA-C  traMADol (ULTRAM) 50 MG tablet Take 1 tablet (50 mg total) by mouth every 6 (six) hours as needed. Patient not taking: Reported on 11/03/2014 06/17/14   Noland Fordyce, PA-C   BP 105/70 mmHg  Pulse 78  Temp(Src) 98.7 F (37.1 C) (Oral)  Resp 15  SpO2 100% Physical Exam  1005: Physical examination:  Nursing notes reviewed; Vital signs and O2 SAT reviewed;  Constitutional: Well developed, Well nourished, Well hydrated, In no acute distress; Head:  Normocephalic, atraumatic; Eyes: EOMI, PERRL, No scleral icterus; ENMT: Mouth and pharynx normal, Mucous membranes moist. Mouth and pharynx without lesions. No tonsillar exudates. No intra-oral edema. No submandibular or sublingual edema. No hoarse voice, no drooling, no stridor. No pain with manipulation of larynx. No trismus. ; Neck: Supple, Full range of motion, No lymphadenopathy; Cardiovascular: Regular rate  and rhythm, No murmur, rub, or gallop; Respiratory: Breath sounds clear & equal bilaterally, No rales, rhonchi, wheezes.  Speaking full sentences with ease, Normal respiratory effort/excursion; Chest: Nontender, Movement normal; Abdomen: Soft, Nontender, Nondistended, Normal bowel sounds; Genitourinary: No CVA tenderness;  Extremities: Pulses normal, No tenderness, No edema, No calf edema or asymmetry.; Neuro: AA&Ox3, Major CN grossly intact.  Speech clear. No gross focal motor or sensory deficits in extremities.; Skin: Color normal, Warm, Dry; pt scratching her arms and legs during exam, generalized mild edema and erythema to face. No blisters, no ecchymosis, no jaundice.   ED Course  Procedures     EKG Interpretation None      MDM  MDM Reviewed: previous chart, nursing note and vitals     1240:  Tx for allergic reaction; feels improved after meds. Pt no longer scratching at herself. Facial swelling improved. Resps easy, Sats 100% R/A, no intra-oral edema, no wheezing/stridor, NAD. Pt wants to go home now. Will continue to tx symptomatically at this time. Dx and testing d/w pt and family.  Questions answered.  Verb understanding, agreeable to d/c home with outpt f/u.    Francine Graven, DO 11/05/14 1314

## 2014-11-03 NOTE — Discharge Instructions (Signed)
°Emergency Department Resource Guide °1) Find a Doctor and Pay Out of Pocket °Although you won't have to find out who is covered by your insurance plan, it is a good idea to ask around and get recommendations. You will then need to call the office and see if the doctor you have chosen will accept you as a new patient and what types of options they offer for patients who are self-pay. Some doctors offer discounts or will set up payment plans for their patients who do not have insurance, but you will need to ask so you aren't surprised when you get to your appointment. ° °2) Contact Your Local Health Department °Not all health departments have doctors that can see patients for sick visits, but many do, so it is worth a call to see if yours does. If you don't know where your local health department is, you can check in your phone book. The CDC also has a tool to help you locate your state's health department, and many state websites also have listings of all of their local health departments. ° °3) Find a Walk-in Clinic °If your illness is not likely to be very severe or complicated, you may want to try a walk in clinic. These are popping up all over the country in pharmacies, drugstores, and shopping centers. They're usually staffed by nurse practitioners or physician assistants that have been trained to treat common illnesses and complaints. They're usually fairly quick and inexpensive. However, if you have serious medical issues or chronic medical problems, these are probably not your best option. ° °No Primary Care Doctor: °- Call [facility] at  832-8000 - they can help you locate a primary care doctor that  accepts your insurance, provides certain services, etc. °- Physician Referral Service- 1-800-533-3463 ° °Chronic Pain Problems: °Organization         Address  Phone   Notes  °[facility]  (336) [phone] Patients need to be referred by their primary care doctor.  ° °Medication  Assistance: °Organization         Address  Phone   Notes  °[place] [facility] [address] °[place], [place] [address] (336) 641-8030 --Must be a resident of [place] °-- Must have NO insurance coverage whatsoever (no Medicaid/ Medicare, etc.) °-- The pt. MUST have a primary care doctor that directs their care regularly and follows them in the community °  °[facility]  (866) 331-1348   °[employer]  (888) 892-1162   ° °Agencies that provide inexpensive medical care: °Organization         Address  Phone   Notes  °[facility]  (336) 832-8035   °[facility]    (336) 832-7272   °[facility] [address] °[place], [place] [address] (336) 832-4777   °[facility] [address], °[place] (336) 271-4999   °[facility]    (336) 373-0678   °[facility]    (336) 272-1050   °[facility] ° [address], [place] Phone:  (336) 832-4444, Fax:  (336) 832-4440 Hours of Operation:  9 am - 6 pm, M-F.  Also accepts Medicaid/Medicare and self-pay.  °[facility] ° [address], [place] Phone: (336) 832-3150, Fax: (336) [phone]. Hours of Operation:  8:30 am - 5:30 pm, M-F.  Also accepts Medicaid and self-pay.  °[facility] [place] [address], [place] Phone: (336) 878-6027   °[facility] [address], [place], [place] (336)723-1848, Ext. 123 Mondays & Thursdays: 7-9 AM.  First 15 patients are seen on a first come, first serve basis. °  ° °Medicaid-accepting [place] Providers: ° °Organization         Address  Phone   Notes  °[facility] [address], [place] (336) 641-2100 Also accepts self-pay patients.  °[facility] [address], [place] ° (336) 856-9996   °[facility] [address], [place]  (336) 288-8857   °[facility] [address] [place] [address], [place] (336) 299-7000   °Veita Bland [address], [place]  ° (336) 373-1557 Only accepts [place] Access Medicaid patients after they have their name applied to their card.  ° °Self-Pay (no insurance) in [place]: ° °Organization         Address  Phone   Notes  °Sickle Cell Patients, [facility] [address], [place] (336) 832-1970   °[facility] [address], [place] (336) 832-4400   °[facility] [place] ° [address], [place] (336) 992-4800   °[facility]/Dr. Osei-Bonsu ° [address] [place] [address], [place] or [address], [place] (336) [phone] Phone number for both [place] and [place] locations is the same.  °[facility] [address], [place] (336) 299-0000   °[facility] [address] [place] [address], [place] or [address] (336) 852-7530 °(336) 878-2260   °[facility] [address], [place] (336) 350-1642, phone; (336) 294-5005, fax Sees patients 1st and 3rd Saturday of every month.  Must not qualify for public or private insurance (i.e. Medicaid, Medicare, [place] Health Choice, Veterans' Benefits) • Household income should be no more than 200% of the poverty level •The clinic cannot treat you if you are pregnant or think you are pregnant • Sexually transmitted diseases are not treated at the clinic.  ° ° °Dental Care: °Organization         Address  Phone  Notes  °[place] [facility] [address], [place] (336) 641-6152 Accepts children up to age 21 who are enrolled in Medicaid or [place] Health Choice; pregnant women with a Medicaid card; and children who have applied for Medicaid or [place] Health Choice, but were declined, whose parents can pay a reduced fee at time of service.  °[place]  [facility] [place]  [address], [place] (336) 641-7733 Accepts children up to age 21 who are enrolled in Medicaid or [place] Health Choice; pregnant women with a Medicaid card; and children who have applied for Medicaid or [place] Health Choice, but were declined, whose parents can pay a reduced fee at time of service.  °[facility] ° [address], [place] (336) 641-4533 Patients are seen by appointment only. Walk-ins are not accepted. [facility] will see patients 18 years of age and older. °Monday - Tuesday (8am-5pm) °Most Wednesdays (8:30-5pm) °$30 per visit, cash only  °[facility] ° [address], [place] (336) 641-4533 Patients are seen by appointment only. Walk-ins are not accepted. [facility] will see patients 18 years of age and older. °One   Wednesday Evening (Monthly: Volunteer Based).  $30 per visit, cash only  °[facility]  (919) 537-3737 for adults; Children under age 4, call Graduate Pediatric Dentistry at (919) [phone]. Children aged 4-14, please call (919) [phone] to request a pediatric application. ° Dental services are provided in all areas of dental care including fillings, crowns and bridges, complete and partial dentures, implants, gum treatment, root canals, and extractions. Preventive care is also provided. Treatment is provided to both adults and children. °Patients are selected via a lottery and there is often a waiting list. °  °[facility] [address], °[place] ° (336) 763-8833 [email or web address] °  °[facility] [address], [place], [place] (336)723-1848, Ext. 123 Second and Fourth Thursday of each month, opens at 6:30 AM; Clinic ends at 9 AM.  Patients are seen on a first-come first-served basis, and a limited number are seen during each clinic.  ° °[facility] ° [address], [place], [place] (336) 723-7904    Eligibility Requirements °You must have lived in [place], [place], or [place] counties for at least the last three months. °  You cannot be eligible for state or federal sponsored healthcare insurance, including Veterans Administration, Medicaid, or Medicare. °  You generally cannot be eligible for healthcare insurance through your employer.  °  How to apply: °Eligibility screenings are held every Tuesday and Wednesday afternoon from 1:00 pm until 4:00 pm. You do not need an appointment for the interview!  °[facility] [address], [place], [place] 336-631-2330   °[facility]  336-342-8273   °[facility]  336-703-3100   °[facility]  336-570-6415   ° °Behavioral Health Resources in the Community: °Intensive Outpatient Programs °Organization         Address  Phone  Notes  °[facility] [address], [place], [place] 336-878-6098   °[facility] [address], [place], [place] 336-832-9800   °[facility] [address], [place], [place] ° 336-882-2125   °[facility] [address],  °[place], [place] 1-800-853-5163 or 336-641-4981   °Substance Abuse Resources °Organization         Address  Phone  Notes  °[facility]  336-882-2125   °[facility]  336-784-9470   °[facility]  336-285-9073   °[facility]  336-845-3988   °Residential & Outpatient Substance Abuse Program  1-800-659-3381   °Psychological Services °Organization         Address  Phone  Notes  °[facility]  336- 832-9600   °[facility]  336- 378-7881   °[facility] [address], [place] 1-800-853-5163 or 336-641-4981   ° °Mobile Crisis Teams °Organization         Address  Phone  Notes  °[facility], Mobile Crisis Care Unit  1-877-626-1772   °Assertive °[facility] ° [address]  [place], [place] 336-834-9664   °Sharon DeEsch [address] °[place] 336-554-5454   ° °Self-Help/Support Groups °Organization         Address  Phone             Notes  °[facility] [place] - variety of support groups  336- 373-1402 Call for more information  °Narcotics Anonymous (NA), [facility] [address], °[place]  2 meetings at this location  ° °  Residential Treatment Programs Organization         Address  Phone  Notes  [facility] [address]    [place]  1-671-401-5761   [facility]  [address], [place] [address]36, [place], [place]   [place], [place] [phone] Admissions: 8am-3pm M-F  [facility] [address],    [place], [place] 468-032-1224   [facility] [address] [place], [place], [place]   [facility] [address],  [place], [place]   [facility] - Intensive Outpatient [address], [place], [place]   [facility] [address]  [place], [place] 1-229-737-8643 or [phone]   [facility] [address] [place], [place] Accepts Medicaid  [facility] [address],  [place] 1-864-428-9519 Substance Abuse/Addiction Treatment   [facility] Organization         Address  Phone  Notes  [facility]  (939) 512-1917   Domenic Schwab, PhD [address] [place], [place]   6464089902 or (626)178-2571   [facility] [place], [place] (480)060-9405   [facility] [address] [place], [place] 539-435-6223 Insurance/Medicaid/sponsorship through [facility] and Families [address]                                    [place], [place] [phone] [place] [address], [place] (587)[phone]    Dr. Adele Schilder  [phone]   [facility] Dept. 1) [address] [place] 2) [place] 3)  [address], [place] 317-238-3164 367-788-7407  225-179-0977   [place] [phone] or [phone] (After Hours)      Take the prescription as directed.  Take over the counter benadryl, as directed on packaging, as needed for itching.  If the benadryl is too sedating, take an over the counter non-sedating antihistamine such as claritin, allegra or zyrtec, as directed on packaging.  Call your regular medical doctor today to schedule a follow up appointment within the next 2 to 3 days.  Return to the Emergency Department immediately sooner if worsening.

## 2015-01-10 ENCOUNTER — Emergency Department (HOSPITAL_COMMUNITY)
Admission: EM | Admit: 2015-01-10 | Discharge: 2015-01-10 | Disposition: A | Discharge status: Home / Self Care | Payer: Self-pay | Attending: Emergency Medicine | Admitting: Emergency Medicine

## 2015-01-10 ENCOUNTER — Encounter (HOSPITAL_COMMUNITY): Payer: Self-pay | Admitting: Oncology

## 2015-01-10 DIAGNOSIS — G43809 Other migraine, not intractable, without status migrainosus: Secondary | ICD-10-CM | POA: Insufficient documentation

## 2015-01-10 DIAGNOSIS — Z720 Tobacco use: Secondary | ICD-10-CM | POA: Insufficient documentation

## 2015-01-10 MED ORDER — DIPHENHYDRAMINE HCL 50 MG/ML IJ SOLN
25.0000 mg | Freq: Once | INTRAMUSCULAR | Status: AC
  Administered 2015-01-10: 25 mg via INTRAVENOUS
  Filled 2015-01-10: qty 1

## 2015-01-10 MED ORDER — KETOROLAC TROMETHAMINE 30 MG/ML IJ SOLN
30.0000 mg | Freq: Once | INTRAMUSCULAR | Status: AC
  Administered 2015-01-10: 30 mg via INTRAVENOUS
  Filled 2015-01-10: qty 1

## 2015-01-10 MED ORDER — SODIUM CHLORIDE 0.9 % IV BOLUS (SEPSIS)
1000.0000 mL | Freq: Once | INTRAVENOUS | Status: AC
  Administered 2015-01-10: 1000 mL via INTRAVENOUS

## 2015-01-10 MED ORDER — METOCLOPRAMIDE HCL 5 MG/ML IJ SOLN
10.0000 mg | Freq: Once | INTRAMUSCULAR | Status: AC
Start: 2015-01-10 — End: 2015-01-10
  Administered 2015-01-10: 10 mg via INTRAVENOUS
  Filled 2015-01-10: qty 2

## 2015-01-10 NOTE — ED Notes (Signed)
Per pt she woke up w/ a headache and sensitivity to light and sound.  Pt took aleve w/o relief.  +nausea/emesis.

## 2015-01-10 NOTE — ED Provider Notes (Signed)
CSN: [id number]     Arrival date & time 01/10/15  0059 History   First MD Initiated Contact with Patient 01/10/15 0141     Chief Complaint  Patient presents with  . Migraine     (Consider location/radiation/quality/duration/timing/severity/associated sxs/prior Treatment) HPI Comments: Carrie Guerra, 29 y/o female presents with migraine without aura. Her migraine started last night aroung 9pm. She had nausea with two episodes of vomiting, photophobia, and phonophobia. Her migraines occur about once every other month and coincide with her menstrual cycle. She took Aleve without relief before coming to the ED. She is not on OCP or any prescription medications for migraines. She does not have a primary care provider or a women's health provider.  Patient is a 29 y.o. female presenting with migraines. The history is provided by the patient and a friend.  Migraine This is a recurrent problem. The current episode started yesterday. The problem occurs intermittently. The problem has been resolved. Associated symptoms include headaches, nausea and vomiting. She has tried NSAIDs for the symptoms. The treatment provided no relief.    History reviewed. No pertinent past medical history. History reviewed. No pertinent past surgical history. No family history on file. History  Substance Use Topics  . Smoking status: Current Some Day Smoker    Types: Cigarettes  . Smokeless tobacco: Not on file  . Alcohol Use: Yes     Comment: 5 shots liquor last night   OB History    No data available     Review of Systems  Eyes: Positive for photophobia.  Gastrointestinal: Positive for nausea and vomiting.  Neurological: Positive for headaches.  All other systems reviewed and are negative.     Allergies  Shrimp  Home Medications   Prior to Admission medications   Medication Sig Start Date End Date Taking? Authorizing Provider  famotidine (PEPCID) 20 MG tablet Take 1 tablet (20 mg total) by mouth 2  (two) times daily. For up to 12 weeks Patient not taking: Reported on 11/03/2014 06/17/14   Noland Fordyce, PA-C  HYDROcodone-acetaminophen (NORCO/VICODIN) 5-325 MG per tablet Take 1 tablet by mouth every 6 (six) hours as needed for moderate pain. Patient not taking: Reported on 11/03/2014 07/13/14   Evelina Bucy, MD  predniSONE (DELTASONE) 20 MG tablet Take 2 tablets (40 mg total) by mouth daily. Start 11/04/14 Patient not taking: Reported on 01/10/2015 11/03/14   Francine Graven, DO  promethazine (PHENERGAN) 25 MG tablet Take 1 tablet (25 mg total) by mouth every 6 (six) hours as needed for nausea or vomiting. Patient not taking: Reported on 11/03/2014 06/17/14   Noland Fordyce, PA-C  traMADol (ULTRAM) 50 MG tablet Take 1 tablet (50 mg total) by mouth every 6 (six) hours as needed. Patient not taking: Reported on 11/03/2014 06/17/14   Noland Fordyce, PA-C   LMP 12/25/2014 (Approximate) Physical Exam  Constitutional: She is oriented to person, place, and time. She appears well-developed and well-nourished. No distress.  HENT:  Head: Normocephalic and atraumatic.  Eyes: Conjunctivae and EOM are normal.  Neck: Normal range of motion. Neck supple.  Cardiovascular: Normal rate and regular rhythm.  Exam reveals no gallop and no friction rub.   No murmur heard. Pulmonary/Chest: Effort normal and breath sounds normal. She has no wheezes. She has no rales. She exhibits no tenderness.  Abdominal: Soft. There is no tenderness.  Musculoskeletal: Normal range of motion.  Neurological: She is alert and oriented to person, place, and time. Coordination normal.  No meningeal signs. Speech is goal-oriented.  Moves limbs without ataxia.   Skin: Skin is warm and dry.  Psychiatric: She has a normal mood and affect. Her behavior is normal.  Nursing note and vitals reviewed.   ED Course  Procedures (including critical care time) Labs Review Labs Reviewed - No data to display  Imaging Review No results found.   EKG  Interpretation None      MDM   Final diagnoses:  Other migraine without status migrainosus, not intractable    Migraine relieved with migraine cocktail. Vitals stable and patient afebrile. No meningeal signs.     [address] [place], PA-C 01/10/15 [place], MD 01/10/15 [phone]

## 2015-01-10 NOTE — ED Notes (Signed)
Pt presents with severe HA onset last night, awoke from sleep, emesis x 2. Pt's skin very warm to touch, pt photosensitive, hiding head under blanket while IV started.

## 2015-01-11 ENCOUNTER — Encounter (HOSPITAL_COMMUNITY): Payer: Self-pay

## 2015-01-11 ENCOUNTER — Emergency Department (HOSPITAL_COMMUNITY)
Admission: EM | Admit: 2015-01-11 | Discharge: 2015-01-11 | Disposition: A | Discharge status: Home / Self Care | Payer: Self-pay | Attending: Emergency Medicine | Admitting: Emergency Medicine

## 2015-01-11 DIAGNOSIS — Z720 Tobacco use: Secondary | ICD-10-CM | POA: Insufficient documentation

## 2015-01-11 DIAGNOSIS — J069 Acute upper respiratory infection, unspecified: Secondary | ICD-10-CM | POA: Insufficient documentation

## 2015-01-11 DIAGNOSIS — R590 Localized enlarged lymph nodes: Secondary | ICD-10-CM | POA: Insufficient documentation

## 2015-01-11 MED ORDER — HYDROCODONE-ACETAMINOPHEN 5-325 MG PO TABS: 1.0000 | ORAL_TABLET | ORAL | Status: DC | PRN

## 2015-01-11 NOTE — Discharge Instructions (Signed)
Upper Respiratory Infection, Adult An upper respiratory infection (URI) is also sometimes known as the common cold. The upper respiratory tract includes the nose, sinuses, throat, trachea, and bronchi. Bronchi are the airways leading to the lungs. Most people improve within 1 week, but symptoms can last up to 2 weeks. A residual cough may last even longer.  CAUSES Many different viruses can infect the tissues lining the upper respiratory tract. The tissues become irritated and inflamed and often become very moist. Mucus production is also common. A cold is contagious. You can easily spread the virus to others by oral contact. This includes kissing, sharing a glass, coughing, or sneezing. Touching your mouth or nose and then touching a surface, which is then touched by another person, can also spread the virus. SYMPTOMS  Symptoms typically develop 1 to 3 days after you come in contact with a cold virus. Symptoms vary from person to person. They may include:  Runny nose.  Sneezing.  Nasal congestion.  Sinus irritation.  Sore throat.  Loss of voice (laryngitis).  Cough.  Fatigue.  Muscle aches.  Loss of appetite.  Headache.  Low-grade fever. DIAGNOSIS  You might diagnose your own cold based on familiar symptoms, since most people get a cold 2 to 3 times a year. Your caregiver can confirm this based on your exam. Most importantly, your caregiver can check that your symptoms are not due to another disease such as strep throat, sinusitis, pneumonia, asthma, or epiglottitis. Blood tests, throat tests, and X-rays are not necessary to diagnose a common cold, but they may sometimes be helpful in excluding other more serious diseases. Your caregiver will decide if any further tests are required. RISKS AND COMPLICATIONS  You may be at risk for a more severe case of the common cold if you smoke cigarettes, have chronic heart disease (such as heart failure) or lung disease (such as asthma), or  if you have a weakened immune system. The very young and very old are also at risk for more serious infections. Bacterial sinusitis, middle ear infections, and bacterial pneumonia can complicate the common cold. The common cold can worsen asthma and chronic obstructive pulmonary disease (COPD). Sometimes, these complications can require emergency medical care and may be life-threatening. PREVENTION  The best way to protect against getting a cold is to practice good hygiene. Avoid oral or hand contact with people with cold symptoms. Wash your hands often if contact occurs. There is no clear evidence that vitamin C, vitamin E, echinacea, or exercise reduces the chance of developing a cold. However, it is always recommended to get plenty of rest and practice good nutrition. TREATMENT  Treatment is directed at relieving symptoms. There is no cure. Antibiotics are not effective, because the infection is caused by a virus, not by bacteria. Treatment may include:  Increased fluid intake. Sports drinks offer valuable electrolytes, sugars, and fluids.  Breathing heated mist or steam (vaporizer or shower).  Eating chicken soup or other clear broths, and maintaining good nutrition.  Getting plenty of rest.  Using gargles or lozenges for comfort.  Controlling fevers with ibuprofen or acetaminophen as directed by your caregiver.  Increasing usage of your inhaler if you have asthma. Zinc gel and zinc lozenges, taken in the first 24 hours of the common cold, can shorten the duration and lessen the severity of symptoms. Pain medicines may help with fever, muscle aches, and throat pain. A variety of non-prescription medicines are available to treat congestion and runny nose. Your caregiver  can make recommendations and may suggest nasal or lung inhalers for other symptoms.  HOME CARE INSTRUCTIONS   Only take over-the-counter or prescription medicines for pain, discomfort, or fever as directed by your  caregiver.  Use a warm mist humidifier or inhale steam from a shower to increase air moisture. This may keep secretions moist and make it easier to breathe.  Drink enough water and fluids to keep your urine clear or pale yellow.  Rest as needed.  Return to work when your temperature has returned to normal or as your caregiver advises. You may need to stay home longer to avoid infecting others. You can also use a face mask and careful hand washing to prevent spread of the virus. SEEK MEDICAL CARE IF:   After the first few days, you feel you are getting worse rather than better.  You need your caregiver's advice about medicines to control symptoms.  You develop chills, worsening shortness of breath, or brown or red sputum. These may be signs of pneumonia.  You develop yellow or brown nasal discharge or pain in the face, especially when you bend forward. These may be signs of sinusitis.  You develop a fever, swollen neck glands, pain with swallowing, or white areas in the back of your throat. These may be signs of strep throat. SEEK IMMEDIATE MEDICAL CARE IF:   You have a fever.  You develop severe or persistent headache, ear pain, sinus pain, or chest pain.  You develop wheezing, a prolonged cough, cough up blood, or have a change in your usual mucus (if you have chronic lung disease).  You develop sore muscles or a stiff neck. Document Released: 12/07/2000 Document Revised: 09/05/2011 Document Reviewed: 09/18/2013 [facility] Patient Information 2015 [place], [place]. This information is not intended to replace advice given to you by your health care provider. Make sure you discuss any questions you have with your health care provider. Cervical Adenitis You have a swollen lymph gland in your neck. This commonly happens with Strep and virus infections, dental problems, insect bites, and injuries about the face, scalp, or neck. The lymph glands swell as the body fights the infection or heals  the injury. Swelling and firmness typically lasts for several weeks after the infection or injury is healed. Rarely lymph glands can become swollen because of cancer or TB. Antibiotics are prescribed if there is evidence of an infection. Sometimes an infected lymph gland becomes filled with pus. This condition may require opening up the abscessed gland by draining it surgically. Most of the time infected glands return to normal within two weeks. Do not poke or squeeze the swollen lymph nodes. That may keep them from shrinking back to their normal size. If the lymph gland is still swollen after 2 weeks, further medical evaluation is needed.  SEEK IMMEDIATE MEDICAL CARE IF:  You have difficulty swallowing or breathing, increased swelling, severe pain, or a high fever.  Document Released: 06/13/2005 Document Revised: 09/05/2011 Document Reviewed: 12/03/2006 [facility] Patient Information 2015 [place], [place]. This information is not intended to replace advice given to you by your health care provider. Make sure you discuss any questions you have with your health care provider.

## 2015-01-11 NOTE — ED Notes (Signed)
She c/o right earache x 2-3 days.  She is in no distress.  States she was seen here [place]. For migraine.

## 2015-01-11 NOTE — ED Provider Notes (Signed)
CSN: [id number]     Arrival date & time 01/11/15  0719 History   First MD Initiated Contact with Patient 01/11/15 [phone]     Chief Complaint  Patient presents with  . Otalgia     (Consider location/radiation/quality/duration/timing/severity/associated sxs/prior Treatment) HPI Comments: Patient here complaining of URI symptoms with associated ear pain 2 to 3 days. No fever or chills. No air drainage. No decreased hearing. Some scratchiness to her throat. No vomiting or diarrhea. Pain is also noted at the right posterior cervical nodes described as tenderness with some swelling. Has used NSAIDs without relief.  Patient is a 29 y.o. female presenting with ear pain. The history is provided by the patient.  Otalgia   History reviewed. No pertinent past medical history. No past surgical history on file. No family history on file. History  Substance Use Topics  . Smoking status: Current Some Day Smoker    Types: Cigarettes  . Smokeless tobacco: Not on file  . Alcohol Use: Yes     Comment: 5 shots liquor last night   OB History    No data available     Review of Systems  HENT: Positive for ear pain.   All other systems reviewed and are negative.     Allergies  Shrimp  Home Medications   Prior to Admission medications   Medication Sig Start Date End Date Taking? Authorizing Provider  famotidine (PEPCID) 20 MG tablet Take 1 tablet (20 mg total) by mouth 2 (two) times daily. For up to 12 weeks Patient not taking: Reported on 11/03/2014 06/17/14   Noland Fordyce, PA-C  HYDROcodone-acetaminophen (NORCO/VICODIN) 5-325 MG per tablet Take 1 tablet by mouth every 6 (six) hours as needed for moderate pain. Patient not taking: Reported on 11/03/2014 07/13/14   Evelina Bucy, MD  predniSONE (DELTASONE) 20 MG tablet Take 2 tablets (40 mg total) by mouth daily. Start 11/04/14 Patient not taking: Reported on 01/10/2015 11/03/14   Francine Graven, DO  promethazine (PHENERGAN) 25 MG tablet Take 1  tablet (25 mg total) by mouth every 6 (six) hours as needed for nausea or vomiting. Patient not taking: Reported on 11/03/2014 06/17/14   Noland Fordyce, PA-C  traMADol (ULTRAM) 50 MG tablet Take 1 tablet (50 mg total) by mouth every 6 (six) hours as needed. Patient not taking: Reported on 11/03/2014 06/17/14   Noland Fordyce, PA-C   BP 134/84 mmHg  Pulse 80  Temp(Src) 98.4 F (36.9 C) (Oral)  Resp 16  SpO2 97%  LMP 12/25/2014 (Approximate) Physical Exam  Constitutional: She is oriented to person, place, and time. She appears well-developed and well-nourished.  Non-toxic appearance.  HENT:  Head: Normocephalic and atraumatic.  Right Ear: Tympanic membrane, external ear and ear canal normal. No swelling. Tympanic membrane is not erythematous.  Eyes: Conjunctivae are normal. Pupils are equal, round, and reactive to light.  Neck: Normal range of motion.  Cardiovascular: Normal rate.   Pulmonary/Chest: Effort normal.  Lymphadenopathy:    She has cervical adenopathy.       Right cervical: Posterior cervical adenopathy present.  Neurological: She is alert and oriented to person, place, and time.  Skin: Skin is warm and dry.  Psychiatric: She has a normal mood and affect.  Nursing note and vitals reviewed.   ED Course  Procedures (including critical care time) Labs Review Labs Reviewed - No data to display  Imaging Review No results found.   EKG Interpretation None      MDM   Final diagnoses:  None    Patient with mild adenitis from URI. Will treat symptomatically    Lacretia Leigh, MD 01/11/15 [phone]

## 2015-03-09 ENCOUNTER — Emergency Department (HOSPITAL_COMMUNITY): Payer: Self-pay

## 2015-03-09 ENCOUNTER — Encounter (HOSPITAL_COMMUNITY): Payer: Self-pay | Admitting: Emergency Medicine

## 2015-03-09 ENCOUNTER — Emergency Department (HOSPITAL_COMMUNITY)
Admission: EM | Admit: 2015-03-09 | Discharge: 2015-03-09 | Disposition: A | Discharge status: Home / Self Care | Payer: Self-pay | Attending: Emergency Medicine | Admitting: Emergency Medicine

## 2015-03-09 DIAGNOSIS — Z3202 Encounter for pregnancy test, result negative: Secondary | ICD-10-CM | POA: Insufficient documentation

## 2015-03-09 DIAGNOSIS — Z720 Tobacco use: Secondary | ICD-10-CM | POA: Insufficient documentation

## 2015-03-09 DIAGNOSIS — R52 Pain, unspecified: Secondary | ICD-10-CM

## 2015-03-09 DIAGNOSIS — D259 Leiomyoma of uterus, unspecified: Secondary | ICD-10-CM

## 2015-03-09 LAB — CBC WITH DIFFERENTIAL/PLATELET
Basophils Absolute: 0 K/uL (ref 0.0–0.1)
Basophils Relative: 0 % (ref 0–1)
Eosinophils Absolute: 0 K/uL (ref 0.0–0.7)
Eosinophils Relative: 1 % (ref 0–5)
HCT: 34 % — ABNORMAL LOW (ref 36.0–46.0)
Hemoglobin: 11.2 g/dL — ABNORMAL LOW (ref 12.0–15.0)
Lymphocytes Relative: 25 % (ref 12–46)
Lymphs Abs: 1.3 K/uL (ref 0.7–4.0)
MCH: 29.4 pg (ref 26.0–34.0)
MCHC: 32.9 g/dL (ref 30.0–36.0)
MCV: 89.2 fL (ref 78.0–100.0)
Monocytes Absolute: 0.6 K/uL (ref 0.1–1.0)
Monocytes Relative: 10 % (ref 3–12)
Neutro Abs: 3.4 K/uL (ref 1.7–7.7)
Neutrophils Relative %: 64 % (ref 43–77)
Platelets: 350 K/uL (ref 150–400)
RBC: 3.81 MIL/uL — ABNORMAL LOW (ref 3.87–5.11)
RDW: 12.6 % (ref 11.5–15.5)
WBC: 5.3 K/uL (ref 4.0–10.5)

## 2015-03-09 LAB — URINALYSIS, ROUTINE W REFLEX MICROSCOPIC
Bilirubin Urine: NEGATIVE
Glucose, UA: NEGATIVE mg/dL
Ketones, ur: NEGATIVE mg/dL
Leukocytes, UA: NEGATIVE
Nitrite: NEGATIVE
Protein, ur: NEGATIVE mg/dL
Specific Gravity, Urine: 1.022 (ref 1.005–1.030)
Urobilinogen, UA: 1 mg/dL (ref 0.0–1.0)
pH: 6.5 (ref 5.0–8.0)

## 2015-03-09 LAB — BASIC METABOLIC PANEL WITH GFR
Anion gap: 8 (ref 5–15)
BUN: 11 mg/dL (ref 6–20)
CO2: 21 mmol/L — ABNORMAL LOW (ref 22–32)
Calcium: 9.1 mg/dL (ref 8.9–10.3)
Chloride: 109 mmol/L (ref 101–111)
Creatinine, Ser: 0.75 mg/dL (ref 0.44–1.00)
GFR calc Af Amer: >60 mL/min
GFR calc non Af Amer: >60 mL/min
Glucose, Bld: 109 mg/dL — ABNORMAL HIGH (ref 65–99)
Potassium: 4 mmol/L (ref 3.5–5.1)
Sodium: 138 mmol/L (ref 135–145)

## 2015-03-09 LAB — URINE MICROSCOPIC-ADD ON

## 2015-03-09 LAB — PREGNANCY, URINE: Preg Test, Ur: NEGATIVE

## 2015-03-09 MED ORDER — OXYCODONE-ACETAMINOPHEN 5-325 MG PO TABS
1.0000 | ORAL_TABLET | Freq: Once | ORAL | Status: AC
  Administered 2015-03-09: 1 via ORAL
  Filled 2015-03-09: qty 1

## 2015-03-09 MED ORDER — OXYCODONE-ACETAMINOPHEN 5-325 MG PO TABS: 2.0000 | ORAL_TABLET | ORAL | Status: DC | PRN

## 2015-03-09 NOTE — ED Notes (Signed)
Ultrasound updated. Pt states bladder is full.

## 2015-03-09 NOTE — ED Notes (Signed)
Pt comes in today with a c/o abdominal pain that started Friday. Pt states that she has a hx of fibrroids and was informed that they needed to be surgically removed. Pt states that work has prevented her from taking care of this issue. Pt contacted [facility] and referred her to here. Pt states that she has more than normal bleeding for her menstrual cycle.

## 2015-03-09 NOTE — ED Provider Notes (Signed)
CSN: [id number]     Arrival date & time 03/09/15  0944 History   First MD Initiated Contact with Patient 03/09/15 1004     Chief Complaint  Patient presents with  . Vaginal Bleeding     (Consider location/radiation/quality/duration/timing/severity/associated sxs/prior Treatment) HPI Comments: Carrie Guerra is a 29 y.o F with a history of uterine fibroids diagnosed 05/2014 with no OB/GYN follow-up who presents to the emergency department today complaining of excessive vaginal bleeding and lower abdominal pain. Patient is currently menstruating and states that that is typically when she feels the most pain from uterine fibroids. Patient states that she can no longer take the pain and didn't know what to do so she came to the emergency department. Patient states that she has used an entire box of tampons today because the bleeding was so heavy. Pain is 10 out of 10 and located in her lower abdomen. Denies fever, chills, nausea, vomiting, diarrhea, dysuria, hematuria, chest pain, back pain, numbness, weakness, vaginal discharge. Patient is in the process of getting insurance which is why she has not seen OB/GYN yet. Patient would like to have her fibroids removed.  Patient is a 29 y.o. female presenting with vaginal bleeding. The history is provided by the patient.  Vaginal Bleeding Associated symptoms: no dyspareunia and no vaginal discharge     History reviewed. No pertinent past medical history. History reviewed. No pertinent past surgical history. Family History  Problem Relation Age of Onset  . Diabetes Mother   . Diabetes Other    Social History  Substance Use Topics  . Smoking status: Current Some Day Smoker    Types: Cigarettes  . Smokeless tobacco: None  . Alcohol Use: Yes     Comment: 5 shots liquor last night   OB History    No data available     Review of Systems  Genitourinary: Positive for vaginal bleeding. Negative for urgency, flank pain, vaginal discharge,  difficulty urinating, genital sores, vaginal pain and dyspareunia.  Neurological: Negative for light-headedness.  All other systems reviewed and are negative.     Allergies  Shrimp  Home Medications   Prior to Admission medications   Medication Sig Start Date End Date Taking? Authorizing Provider  acetaminophen (TYLENOL) 500 MG tablet Take 2,000 mg by mouth every 6 (six) hours as needed for moderate pain.   Yes Historical Provider, MD  Aspirin-Acetaminophen-Caffeine (PAMPRIN MAX PO) Take 4 tablets by mouth once.   Yes Historical Provider, MD  HYDROcodone-acetaminophen (NORCO/VICODIN) 5-325 MG per tablet Take 1-2 tablets by mouth every 4 (four) hours as needed. 01/11/15  Yes Lacretia Leigh, MD  famotidine (PEPCID) 20 MG tablet Take 1 tablet (20 mg total) by mouth 2 (two) times daily. For up to 12 weeks Patient not taking: Reported on 11/03/2014 06/17/14   Noland Fordyce, PA-C  oxyCODONE-acetaminophen (PERCOCET/ROXICET) 5-325 MG per tablet Take 2 tablets by mouth every 4 (four) hours as needed for severe pain. 03/09/15   Hanako Tipping Tripp Kaniyah Lisby, PA-C  predniSONE (DELTASONE) 20 MG tablet Take 2 tablets (40 mg total) by mouth daily. Start 11/04/14 Patient not taking: Reported on 01/10/2015 11/03/14   Francine Graven, DO  promethazine (PHENERGAN) 25 MG tablet Take 1 tablet (25 mg total) by mouth every 6 (six) hours as needed for nausea or vomiting. Patient not taking: Reported on 11/03/2014 06/17/14   Noland Fordyce, PA-C  traMADol (ULTRAM) 50 MG tablet Take 1 tablet (50 mg total) by mouth every 6 (six) hours as needed. Patient not taking: Reported  on 11/03/2014 06/17/14   Noland Fordyce, PA-C   BP 125/81 mmHg  Pulse 68  Temp(Src) 98.3 F (36.8 C) (Oral)  Resp 18  Ht 5' (1.524 m)  Wt 145 lb (65.772 kg)  BMI 28.32 kg/m2  SpO2 99%  LMP  Physical Exam  Constitutional: She is oriented to person, place, and time. She appears well-developed and well-nourished. No distress.  HENT:  Head: Normocephalic  and atraumatic.  Mouth/Throat: Oropharynx is clear and moist. No oropharyngeal exudate.  Eyes: Conjunctivae and EOM are normal. Pupils are equal, round, and reactive to light. Right eye exhibits no discharge. Left eye exhibits no discharge. No scleral icterus.  Cardiovascular: Normal rate, regular rhythm, normal heart sounds and intact distal pulses.  Exam reveals no gallop and no friction rub.   No murmur heard. Pulmonary/Chest: Effort normal and breath sounds normal. No respiratory distress. She has no wheezes. She has no rales. She exhibits no tenderness.  Abdominal: Soft. Bowel sounds are normal. She exhibits no distension and no mass. There is tenderness ( TTP of suprapubic region of abdomen). There is guarding. There is no rebound.  Musculoskeletal: Normal range of motion. She exhibits no edema.  Neurological: She is alert and oriented to person, place, and time.  Strength 5/5 throughout. No sensory deficits.    Skin: Skin is warm and dry. No rash noted. She is not diaphoretic. No erythema. No pallor.  Psychiatric: She has a normal mood and affect. Her behavior is normal.  Nursing note and vitals reviewed.   ED Course  Procedures (including critical care time)  Pt given percocet, with some relief  Pelvic U/S ordered  Labs Review Labs Reviewed  URINALYSIS, ROUTINE W REFLEX MICROSCOPIC (NOT AT [facility]) - Abnormal; Notable for the following:    APPearance CLOUDY (*)    Hgb urine dipstick MODERATE (*)    All other components within normal limits  CBC WITH DIFFERENTIAL/PLATELET - Abnormal; Notable for the following:    RBC 3.81 (*)    Hemoglobin 11.2 (*)    HCT 34.0 (*)    All other components within normal limits  BASIC METABOLIC PANEL - Abnormal; Notable for the following:    CO2 21 (*)    Glucose, Bld 109 (*)    All other components within normal limits  PREGNANCY, URINE  URINE MICROSCOPIC-ADD ON    Imaging Review US Pelvis Complete  03/09/2015   CLINICAL DATA:  Pelvic  pain for 5 days. History of fibroids. Not sexually active.  EXAM: TRANSABDOMINAL ULTRASOUND OF PELVIS  TECHNIQUE: Transabdominal ultrasound examination of the pelvis was performed including evaluation of the uterus, ovaries, adnexal regions, and pelvic cul-de-sac.  COMPARISON:  None.  FINDINGS: Uterus  Measurements: 9.2 x 5.1 x 6.4 cm. Hypoechoic mass exophytic to the uterine fundus measures 3.1 x 2.7 x 2.9 cm, consistent with sub serosal fibroid. Endometrium appears homogeneous in thickness throughout with a normal demonstrated measurement of 8 mm thickness. No mass or fluid seen within the endometrial canal.  Endometrium  Thickness: 8 mm. No mass or fluid seen within the endometrial canal.  Right ovary  Measurements: 3.5 x 1.7 x 2.6 cm. Normal appearance/no adnexal mass.  Left ovary  Measurements: 2.8 x 1.6 x 2.1 cm. Normal appearance/no adnexal mass.  Other findings: Trace free fluid in the posterior cul-de-sac is likely physiologic in nature.  IMPRESSION: Fibroid exophytic to the uterine fundus measuring 3.1 x 2.7 x 2.9 cm.  Uterus appears otherwise normal. Endometrium appears normal in thickness and there is  no mass or fluid seen within the endometrial canal.  Both ovaries appear normal and there is no mass or free fluid seen within either adnexal region.  Trace free fluid in the posterior cul-de-sac is likely physiologic in nature.   Electronically Signed   By: Franki Cabot M.D.   On: 03/09/2015 15:07   I have personally reviewed and evaluated these images and lab results as part of my medical decision-making.   EKG Interpretation None      MDM   Final diagnoses:  Uterine leiomyoma, unspecified location    Pt seen for vaginal bleeding and abdominal pain due to uterine fibroids. Hemoglobin stable. Vital signs stable. Pelvic ultrasound performed revealing fibroids, no free fluid in adnexal region. UA negative for infection. No dysuria or vaginal discharge. Pain improved with Percocet. We will  give outpatient pain meds. Recommend close follow-up with OB/GYN. Discussed with patient who is agreeable. Return precautions outlined in patient discharge instructions.  Patient was discussed with and seen by Dr. Lacinda Axon who agrees with the treatment plan.      Dondra Spry [place], PA-C 03/09/15 [place], MD 03/10/15 1242

## 2015-03-09 NOTE — ED Notes (Signed)
PA at bedside.

## 2015-03-09 NOTE — ED Notes (Signed)
Pt transported to [place] with technician.

## 2015-03-09 NOTE — Discharge Instructions (Signed)
Fibroids Fibroids are lumps (tumors) that can occur any place in a woman's body. These lumps are not cancerous. Fibroids vary in size, weight, and where they grow. HOME CARE  Do not take aspirin.  Write down the number of pads or tampons you use during your period. Tell your doctor. This can help determine the best treatment for you. GET HELP RIGHT AWAY IF:  You have pain in your lower belly (abdomen) that is not helped with medicine.  You have cramps that are not helped with medicine.  You have more bleeding between or during your period.  You feel lightheaded or pass out (faint).  Your lower belly pain gets worse. MAKE SURE YOU:  Understand these instructions.  Will watch your condition.  Will get help right away if you are not doing well or get worse. Document Released: 07/16/2010 Document Revised: 09/05/2011 Document Reviewed: 07/16/2010 [facility] Patient Information 2015 [place], [place]. This information is not intended to replace advice given to you by your health care provider. Make sure you discuss any questions you have with your health care provider.  Follow up with OBGYN as soon as possible. Return if shortness of breath, weakness occurs or if bleeding worsens.

## 2015-03-09 NOTE — ED Notes (Signed)
Pt states she was here and they did a scan on her and found she had fibroids  Pt states she is having a lot of pain and heavy vaginal bleeding  Pt states she is going through a box of super tampons in 2 days  Pt [facility] and they were unable to find a doctor to take care of her   Pt states she was told to return if bleeding and pain worsened

## 2015-07-16 ENCOUNTER — Encounter (HOSPITAL_COMMUNITY): Payer: Self-pay | Admitting: Emergency Medicine

## 2015-07-16 ENCOUNTER — Emergency Department (HOSPITAL_COMMUNITY): Payer: Self-pay

## 2015-07-16 ENCOUNTER — Emergency Department (HOSPITAL_COMMUNITY)
Admission: EM | Admit: 2015-07-16 | Discharge: 2015-07-16 | Disposition: A | Discharge status: Home / Self Care | Payer: Self-pay | Attending: Emergency Medicine | Admitting: Emergency Medicine

## 2015-07-16 DIAGNOSIS — F17210 Nicotine dependence, cigarettes, uncomplicated: Secondary | ICD-10-CM | POA: Insufficient documentation

## 2015-07-16 DIAGNOSIS — J069 Acute upper respiratory infection, unspecified: Secondary | ICD-10-CM | POA: Insufficient documentation

## 2015-07-16 DIAGNOSIS — J0100 Acute maxillary sinusitis, unspecified: Secondary | ICD-10-CM | POA: Insufficient documentation

## 2015-07-16 MED ORDER — AMOXICILLIN 500 MG PO CAPS: 500.0000 mg | ORAL_CAPSULE | Freq: Three times a day (TID) | ORAL | Status: DC

## 2015-07-16 NOTE — ED Notes (Signed)
Pt states that she has been having cough, nasal congestion, and headache for one week.  Denies NVD.

## 2015-07-16 NOTE — ED Notes (Signed)
Bed: WA26 Expected date:  Expected time:  Means of arrival:  Comments: 

## 2015-07-16 NOTE — ED Provider Notes (Signed)
CSN: [id number]     Arrival date & time 07/16/15  0735 History   First MD Initiated Contact with Patient 07/16/15 1013     Chief Complaint  Patient presents with  . Nasal Congestion  . Cough     (Consider location/radiation/quality/duration/timing/severity/associated sxs/prior Treatment) Patient is a 30 y.o. female presenting with cough. The history is provided by the patient.  Cough Cough characteristics:  Non-productive Severity:  Moderate Onset quality:  Gradual Duration:  1 week Timing:  Constant Progression:  Worsening Chronicity:  New Relieved by:  Nothing Worsened by:  Nothing tried Ineffective treatments:  None tried Associated symptoms: no fever     History reviewed. No pertinent past medical history. No past surgical history on file. Family History  Problem Relation Age of Onset  . Diabetes Mother   . Diabetes Other    Social History  Substance Use Topics  . Smoking status: Current Some Day Smoker    Types: Cigarettes  . Smokeless tobacco: None  . Alcohol Use: Yes     Comment: 5 shots liquor last night   OB History    No data available     Review of Systems  Constitutional: Negative for fever.  Respiratory: Positive for cough.   All other systems reviewed and are negative.     Allergies  Shrimp  Home Medications   Prior to Admission medications   Medication Sig Start Date End Date Taking? Authorizing Provider  acetaminophen (TYLENOL) 500 MG tablet Take 2,000 mg by mouth every 6 (six) hours as needed for moderate pain.   Yes Historical Provider, MD  Aspirin-Acetaminophen-Caffeine (PAMPRIN MAX PO) Take 4 tablets by mouth once.   Yes Historical Provider, MD  Chlorpheniramine-DM (CORICIDIN HBP COUGH/COLD PO) Take 2 tablets by mouth 2 (two) times daily as needed (cold symptoms).   Yes Historical Provider, MD  HYDROcodone-acetaminophen (NORCO/VICODIN) 5-325 MG per tablet Take 1-2 tablets by mouth every 4 (four) hours as needed. 01/11/15   Lacretia Leigh, MD  oxyCODONE-acetaminophen (PERCOCET/ROXICET) 5-325 MG per tablet Take 2 tablets by mouth every 4 (four) hours as needed for severe pain. 03/09/15   Samantha Tripp Dowless, PA-C   BP 129/98 mmHg  Pulse 95  Temp(Src) 98.4 F (36.9 C) (Oral)  Resp 18  SpO2 100% Physical Exam  Constitutional: She is oriented to person, place, and time. She appears well-developed and well-nourished.  HENT:  Head: Normocephalic and atraumatic.  Right Ear: External ear normal.  Left Ear: External ear normal.  Mouth/Throat: Oropharynx is clear and moist.  Nasal congestion  Eyes: Conjunctivae and EOM are normal. Pupils are equal, round, and reactive to light.  Neck: Normal range of motion.  Cardiovascular: Normal rate and normal heart sounds.   Pulmonary/Chest: Effort normal.  Rhonchi al lobes  Abdominal: She exhibits no distension.  Musculoskeletal: Normal range of motion.  Neurological: She is alert and oriented to person, place, and time.  Skin: Skin is warm.  Psychiatric: She has a normal mood and affect.  Nursing note and vitals reviewed.   ED Course  Procedures (including critical care time) Labs Review Labs Reviewed - No data to display  Imaging Review Dg Chest 2 View  07/16/2015  CLINICAL DATA:  Shortness of breath with chest pain and cough for 1 week EXAM: CHEST  2 VIEW COMPARISON:  None. FINDINGS: Lungs are clear. Heart size and pulmonary vascularity are normal. No adenopathy. No bone lesions. No pneumothorax. IMPRESSION: No edema or consolidation. Electronically Signed   By: Lowella Grip  III M.D.   On: 07/16/2015 10:39   I have personally reviewed and evaluated these images and lab results as part of my medical decision-making.   EKG Interpretation None      MDM   Final diagnoses:  Acute maxillary sinusitis, recurrence not specified  URI (upper respiratory infection)        Fransico Meadow, PA-C 07/16/15 [place], MD 07/16/15 1102

## 2015-07-16 NOTE — Discharge Instructions (Signed)
Cough, Adult A cough helps to clear your throat and lungs. A cough may last only 2-3 weeks (acute), or it may last longer than 8 weeks (chronic). Many different things can cause a cough. A cough may be a sign of an illness or another medical condition. HOME CARE  Pay attention to any changes in your cough.  Take medicines only as told by your doctor.  If you were prescribed an antibiotic medicine, take it as told by your doctor. Do not stop taking it even if you start to feel better.  Talk with your doctor before you try using a cough medicine.  Drink enough fluid to keep your pee (urine) clear or pale yellow.  If the air is dry, use a cold steam vaporizer or humidifier in your home.  Stay away from things that make you cough at work or at home.  If your cough is worse at night, try using extra pillows to raise your head up higher while you sleep.  Do not smoke, and try not to be around smoke. If you need help quitting, ask your doctor.  Do not have caffeine.  Do not drink alcohol.  Rest as needed. GET HELP IF:  You have new problems (symptoms).  You cough up yellow fluid (pus).  Your cough does not get better after 2-3 weeks, or your cough gets worse.  Medicine does not help your cough and you are not sleeping well.  You have pain that gets worse or pain that is not helped with medicine.  You have a fever.  You are losing weight and you do not know why.  You have night sweats. GET HELP RIGHT AWAY IF:  You cough up blood.  You have trouble breathing.  Your heartbeat is very fast.   This information is not intended to replace advice given to you by your health care provider. Make sure you discuss any questions you have with your health care provider.   Document Released: 02/24/2011 Document Revised: 03/04/2015 Document Reviewed: 08/20/2014 Elsevier Interactive Patient Education 2016 [employer] Sinusitis, Adult Sinusitis is redness, soreness, and  inflammation of the paranasal sinuses. Paranasal sinuses are air pockets within the bones of your face. They are located beneath your eyes, in the middle of your forehead, and above your eyes. In healthy paranasal sinuses, mucus is able to drain out, and air is able to circulate through them by way of your nose. However, when your paranasal sinuses are inflamed, mucus and air can become trapped. This can allow bacteria and other germs to grow and cause infection. Sinusitis can develop quickly and last only a short time (acute) or continue over a long period (chronic). Sinusitis that lasts for more than 12 weeks is considered chronic. CAUSES Causes of sinusitis include:  Allergies.  Structural abnormalities, such as displacement of the cartilage that separates your nostrils (deviated septum), which can decrease the air flow through your nose and sinuses and affect sinus drainage.  Functional abnormalities, such as when the small hairs (cilia) that line your sinuses and help remove mucus do not work properly or are not present. SIGNS AND SYMPTOMS Symptoms of acute and chronic sinusitis are the same. The primary symptoms are pain and pressure around the affected sinuses. Other symptoms include:  Upper toothache.  Earache.  Headache.  Bad breath.  Decreased sense of smell and taste.  A cough, which worsens when you are lying flat.  Fatigue.  Fever.  Thick drainage from your nose, which often is  green and may contain pus (purulent).  Swelling and warmth over the affected sinuses. DIAGNOSIS Your health care provider will perform a physical exam. During your exam, your health care provider may perform any of the following to help determine if you have acute sinusitis or chronic sinusitis:  Look in your nose for signs of abnormal growths in your nostrils (nasal polyps).  Tap over the affected sinus to check for signs of infection.  View the inside of your sinuses using an imaging  device that has a light attached (endoscope). If your health care provider suspects that you have chronic sinusitis, one or more of the following tests may be recommended:  Allergy tests.  Nasal culture. A sample of mucus is taken from your nose, sent to a lab, and screened for bacteria.  Nasal cytology. A sample of mucus is taken from your nose and examined by your health care provider to determine if your sinusitis is related to an allergy. TREATMENT Most cases of acute sinusitis are related to a viral infection and will resolve on their own within 10 days. Sometimes, medicines are prescribed to help relieve symptoms of both acute and chronic sinusitis. These may include pain medicines, decongestants, nasal steroid sprays, or saline sprays. However, for sinusitis related to a bacterial infection, your health care provider will prescribe antibiotic medicines. These are medicines that will help kill the bacteria causing the infection. Rarely, sinusitis is caused by a fungal infection. In these cases, your health care provider will prescribe antifungal medicine. For some cases of chronic sinusitis, surgery is needed. Generally, these are cases in which sinusitis recurs more than 3 times per year, despite other treatments. HOME CARE INSTRUCTIONS  Drink plenty of water. Water helps thin the mucus so your sinuses can drain more easily.  Use a humidifier.  Inhale steam 3-4 times a day (for example, sit in the bathroom with the shower running).  Apply a warm, moist washcloth to your face 3-4 times a day, or as directed by your health care provider.  Use saline nasal sprays to help moisten and clean your sinuses.  Take medicines only as directed by your health care provider.  If you were prescribed either an antibiotic or antifungal medicine, finish it all even if you start to feel better. SEEK IMMEDIATE MEDICAL CARE IF:  You have increasing pain or severe headaches.  You have nausea,  vomiting, or drowsiness.  You have swelling around your face.  You have vision problems.  You have a stiff neck.  You have difficulty breathing.   This information is not intended to replace advice given to you by your health care provider. Make sure you discuss any questions you have with your health care provider.   Document Released: 06/13/2005 Document Revised: 07/04/2014 Document Reviewed: 06/28/2011 Elsevier Interactive Patient Education [employer].

## 2015-09-11 ENCOUNTER — Emergency Department (HOSPITAL_COMMUNITY)
Admission: EM | Admit: 2015-09-11 | Discharge: 2015-09-11 | Disposition: A | Discharge status: Home / Self Care | Payer: Self-pay | Attending: Emergency Medicine | Admitting: Emergency Medicine

## 2015-09-11 ENCOUNTER — Encounter (HOSPITAL_COMMUNITY): Payer: Self-pay | Admitting: *Deleted

## 2015-09-11 ENCOUNTER — Emergency Department (HOSPITAL_COMMUNITY): Payer: Self-pay

## 2015-09-11 DIAGNOSIS — Z792 Long term (current) use of antibiotics: Secondary | ICD-10-CM | POA: Insufficient documentation

## 2015-09-11 DIAGNOSIS — R059 Cough, unspecified: Secondary | ICD-10-CM

## 2015-09-11 DIAGNOSIS — R05 Cough: Secondary | ICD-10-CM

## 2015-09-11 DIAGNOSIS — J069 Acute upper respiratory infection, unspecified: Secondary | ICD-10-CM | POA: Insufficient documentation

## 2015-09-11 DIAGNOSIS — B349 Viral infection, unspecified: Secondary | ICD-10-CM | POA: Insufficient documentation

## 2015-09-11 DIAGNOSIS — Z87891 Personal history of nicotine dependence: Secondary | ICD-10-CM | POA: Insufficient documentation

## 2015-09-11 MED ORDER — BENZONATATE 100 MG PO CAPS: 100.0000 mg | ORAL_CAPSULE | Freq: Three times a day (TID) | ORAL | Status: DC

## 2015-09-11 MED ORDER — FLUTICASONE PROPIONATE 50 MCG/ACT NA SUSP: 2.0000 | Freq: Every day | NASAL | Status: DC

## 2015-09-11 MED ORDER — IBUPROFEN 800 MG PO TABS: 800.0000 mg | ORAL_TABLET | Freq: Three times a day (TID) | ORAL | Status: DC

## 2015-09-11 MED ORDER — GUAIFENESIN ER 600 MG PO TB12: 600.0000 mg | ORAL_TABLET | Freq: Two times a day (BID) | ORAL | Status: DC | PRN

## 2015-09-11 NOTE — ED Notes (Signed)
Pt c/o congestion, productive cough, dark secretions expelled, chills, headache

## 2015-09-11 NOTE — Discharge Instructions (Signed)
Upper Respiratory Infection, Adult Most upper respiratory infections (URIs) are a viral infection of the air passages leading to the lungs. A URI affects the nose, throat, and upper air passages. The most common type of URI is nasopharyngitis and is typically referred to as "the common cold." URIs run their course and usually go away on their own. Most of the time, a URI does not require medical attention, but sometimes a bacterial infection in the upper airways can follow a viral infection. This is called a secondary infection. Sinus and middle ear infections are common types of secondary upper respiratory infections. Bacterial pneumonia can also complicate a URI. A URI can worsen asthma and chronic obstructive pulmonary disease (COPD). Sometimes, these complications can require emergency medical care and may be life threatening.  CAUSES Almost all URIs are caused by viruses. A virus is a type of germ and can spread from one person to another.  RISKS FACTORS You may be at risk for a URI if:   You smoke.   You have chronic heart or lung disease.  You have a weakened defense (immune) system.   You are very young or very old.   You have nasal allergies or asthma.  You work in crowded or poorly ventilated areas.  You work in health care facilities or schools. SIGNS AND SYMPTOMS  Symptoms typically develop 2-3 days after you come in contact with a cold virus. Most viral URIs last 7-10 days. However, viral URIs from the influenza virus (flu virus) can last 14-18 days and are typically more severe. Symptoms may include:   Runny or stuffy (congested) nose.   Sneezing.   Cough.   Sore throat.   Headache.   Fatigue.   Fever.   Loss of appetite.   Pain in your forehead, behind your eyes, and over your cheekbones (sinus pain).  Muscle aches.  DIAGNOSIS  Your health care provider may diagnose a URI by:  Physical exam.  Tests to check that your symptoms are not due to  another condition such as:  Strep throat.  Sinusitis.  Pneumonia.  Asthma. TREATMENT  A URI goes away on its own with time. It cannot be cured with medicines, but medicines may be prescribed or recommended to relieve symptoms. Medicines may help:  Reduce your fever.  Reduce your cough.  Relieve nasal congestion. HOME CARE INSTRUCTIONS   Take medicines only as directed by your health care provider.   Gargle warm saltwater or take cough drops to comfort your throat as directed by your health care provider.  Use a warm mist humidifier or inhale steam from a shower to increase air moisture. This may make it easier to breathe.  Drink enough fluid to keep your urine clear or pale yellow.   Eat soups and other clear broths and maintain good nutrition.   Rest as needed.   Return to work when your temperature has returned to normal or as your health care provider advises. You may need to stay home longer to avoid infecting others. You can also use a face mask and careful hand washing to prevent spread of the virus.  Increase the usage of your inhaler if you have asthma.   Do not use any tobacco products, including cigarettes, chewing tobacco, or electronic cigarettes. If you need help quitting, ask your health care provider. PREVENTION  The best way to protect yourself from getting a cold is to practice good hygiene.   Avoid oral or hand contact with people with cold   symptoms.   Wash your hands often if contact occurs.  There is no clear evidence that vitamin C, vitamin E, echinacea, or exercise reduces the chance of developing a cold. However, it is always recommended to get plenty of rest, exercise, and practice good nutrition.  SEEK MEDICAL CARE IF:   You are getting worse rather than better.   Your symptoms are not controlled by medicine.   You have chills.  You have worsening shortness of breath.  You have brown or red mucus.  You have yellow or brown nasal  discharge.  You have pain in your face, especially when you bend forward.  You have a fever.  You have swollen neck glands.  You have pain while swallowing.  You have white areas in the back of your throat. SEEK IMMEDIATE MEDICAL CARE IF:   You have severe or persistent:  Headache.  Ear pain.  Sinus pain.  Chest pain.  You have chronic lung disease and any of the following:  Wheezing.  Prolonged cough.  Coughing up blood.  A change in your usual mucus.  You have a stiff neck.  You have changes in your:  Vision.  Hearing.  Thinking.  Mood. MAKE SURE YOU:   Understand these instructions.  Will watch your condition.  Will get help right away if you are not doing well or get worse.   This information is not intended to replace advice given to you by your health care provider. Make sure you discuss any questions you have with your health care provider.   Document Released: 12/07/2000 Document Revised: 10/28/2014 Document Reviewed: 09/18/2013 Elsevier Interactive Patient Education 2016 Elsevier Inc.  

## 2015-09-12 NOTE — ED Provider Notes (Signed)
CSN: [id number]     Arrival date & time 09/11/15  0827 History   First MD Initiated Contact with Patient 09/11/15 0901     Chief Complaint  Patient presents with  . URI    HPI   Carrie Guerra is an 30 y.o. female with no significant PMH who presents to the ED for evaluation of cough, congestion, chills, headache, body aches. She states her symptoms began three days ago. She reports cough productive of dark green sputum. States she has had chills and hot flashes but unsure if she has been febrile. States she feels like her nose is so stuffed up it is hard to breathe. Reports fatigue and feeling weak all over. Denies chest pain or SOB. Denies abd pain, n/v/d, urinary symptoms. Denies sick contacts. She has tried OTC meds with no relief.  History reviewed. No pertinent past medical history. History reviewed. No pertinent past surgical history. Family History  Problem Relation Age of Onset  . Diabetes Mother   . Diabetes Other    Social History  Substance Use Topics  . Smoking status: Former Smoker    Types: Cigarettes  . Smokeless tobacco: None  . Alcohol Use: Yes     Comment: 5 shots liquor last night   OB History    No data available     Review of Systems  All other systems reviewed and are negative.     Allergies  Shrimp  Home Medications   Prior to Admission medications   Medication Sig Start Date End Date Taking? Authorizing Provider  acetaminophen (TYLENOL) 500 MG tablet Take 2,000 mg by mouth every 6 (six) hours as needed for moderate pain.    Historical Provider, MD  amoxicillin (AMOXIL) 500 MG capsule Take 1 capsule (500 mg total) by mouth 3 (three) times daily. 07/16/15   Fransico Meadow, PA-C  Aspirin-Acetaminophen-Caffeine (PAMPRIN MAX PO) Take 4 tablets by mouth once.    Historical Provider, MD  benzonatate (TESSALON) 100 MG capsule Take 1 capsule (100 mg total) by mouth every 8 (eight) hours. 09/11/15   Olivia Canter Sandeep Radell, PA-C  Chlorpheniramine-DM (CORICIDIN HBP  COUGH/COLD PO) Take 2 tablets by mouth 2 (two) times daily as needed (cold symptoms).    Historical Provider, MD  fluticasone (FLONASE) 50 MCG/ACT nasal spray Place 2 sprays into both nostrils daily. 09/11/15   Olivia Canter Doral Ventrella, PA-C  guaiFENesin (MUCINEX) 600 MG 12 hr tablet Take 1 tablet (600 mg total) by mouth 2 (two) times daily as needed for to loosen phlegm. 09/11/15   Anne Ng, PA-C  HYDROcodone-acetaminophen (NORCO/VICODIN) 5-325 MG per tablet Take 1-2 tablets by mouth every 4 (four) hours as needed. 01/11/15   Lacretia Leigh, MD  ibuprofen (ADVIL,MOTRIN) 800 MG tablet Take 1 tablet (800 mg total) by mouth 3 (three) times daily. 09/11/15   Anne Ng, PA-C  oxyCODONE-acetaminophen (PERCOCET/ROXICET) 5-325 MG per tablet Take 2 tablets by mouth every 4 (four) hours as needed for severe pain. 03/09/15   Samantha Tripp Dowless, PA-C   BP 110/72 mmHg  Pulse 86  Temp(Src) 97.8 F (36.6 C) (Oral)  Resp 16  SpO2 94%  LMP 08/25/2015 Physical Exam  Constitutional: She is oriented to person, place, and time. No distress.  HENT:  Head: Atraumatic.  Right Ear: External ear normal.  Left Ear: External ear normal.  Nose: Mucosal edema present. Right sinus exhibits no maxillary sinus tenderness and no frontal sinus tenderness. Left sinus exhibits no maxillary sinus tenderness and no frontal sinus  tenderness.  Eyes: Conjunctivae are normal. No scleral icterus.  Neck: Normal range of motion. Neck supple.  Cardiovascular: Normal rate, regular rhythm and normal heart sounds.   Pulmonary/Chest: Effort normal and breath sounds normal. No respiratory distress. She has no wheezes. She has no rales. She exhibits no tenderness.  Abdominal: Soft. She exhibits no distension. There is no tenderness.  Neurological: She is alert and oriented to person, place, and time.  Skin: Skin is warm and dry. She is not diaphoretic.  Psychiatric: She has a normal mood and affect. Her behavior is normal.  Nursing note and  vitals reviewed.   ED Course  Procedures (including critical care time) Labs Review Labs Reviewed - No data to display  Imaging Review Dg Chest 2 View  09/11/2015  CLINICAL DATA:  30 year old with cough, congestion and chills for approximately 1 week. Occasional smoker. EXAM: CHEST  2 VIEW COMPARISON:  07/16/2015. FINDINGS: The heart size and mediastinal contours are normal. The lungs are clear. There is no pleural effusion or pneumothorax. No acute osseous findings are identified. IMPRESSION: Stable chest.  No active cardiopulmonary process. Electronically Signed   By: Richardean Sale M.D.   On: 09/11/2015 09:06   I have personally reviewed and evaluated these images and lab results as part of my medical decision-making.   EKG Interpretation None      MDM   Final diagnoses:  URI (upper respiratory infection)  Viral syndrome  Cough    CXR negative. Likely viral etiology. Rx given for supportive meds including flonase, tessalon, mucinex, ibuprofen. Work note given. Instructed to f/u with PCP. ER return precautions given.    Anne Ng, PA-C 09/12/15 [id number]  Carmin Muskrat, MD 09/14/15 [phone]

## 2015-10-10 ENCOUNTER — Emergency Department (HOSPITAL_COMMUNITY): Payer: Self-pay

## 2015-10-10 ENCOUNTER — Emergency Department (HOSPITAL_COMMUNITY)
Admission: EM | Admit: 2015-10-10 | Discharge: 2015-10-10 | Disposition: A | Discharge status: Home / Self Care | Payer: Self-pay | Attending: Emergency Medicine | Admitting: Emergency Medicine

## 2015-10-10 ENCOUNTER — Encounter (HOSPITAL_COMMUNITY): Payer: Self-pay | Admitting: Emergency Medicine

## 2015-10-10 DIAGNOSIS — Z3202 Encounter for pregnancy test, result negative: Secondary | ICD-10-CM | POA: Insufficient documentation

## 2015-10-10 DIAGNOSIS — Z87891 Personal history of nicotine dependence: Secondary | ICD-10-CM | POA: Insufficient documentation

## 2015-10-10 DIAGNOSIS — Z7951 Long term (current) use of inhaled steroids: Secondary | ICD-10-CM | POA: Insufficient documentation

## 2015-10-10 DIAGNOSIS — J40 Bronchitis, not specified as acute or chronic: Secondary | ICD-10-CM

## 2015-10-10 DIAGNOSIS — J209 Acute bronchitis, unspecified: Secondary | ICD-10-CM | POA: Insufficient documentation

## 2015-10-10 LAB — CBC WITH DIFFERENTIAL/PLATELET
BASOS ABS: 0 10*3/uL (ref 0.0–0.1)
Basophils Relative: 0 %
EOS PCT: 1 %
Eosinophils Absolute: 0 10*3/uL (ref 0.0–0.7)
HCT: 34.4 % — ABNORMAL LOW (ref 36.0–46.0)
Hemoglobin: 12 g/dL (ref 12.0–15.0)
LYMPHS ABS: 1.1 10*3/uL (ref 0.7–4.0)
LYMPHS PCT: 33 %
MCH: 30.2 pg (ref 26.0–34.0)
MCHC: 34.9 g/dL (ref 30.0–36.0)
MCV: 86.6 fL (ref 78.0–100.0)
MONO ABS: 0.5 10*3/uL (ref 0.1–1.0)
Monocytes Relative: 15 %
Neutro Abs: 1.6 10*3/uL — ABNORMAL LOW (ref 1.7–7.7)
Neutrophils Relative %: 51 %
PLATELETS: 225 10*3/uL (ref 150–400)
RBC: 3.97 MIL/uL (ref 3.87–5.11)
RDW: 12.2 % (ref 11.5–15.5)
WBC: 3.1 10*3/uL — AB (ref 4.0–10.5)

## 2015-10-10 LAB — COMPREHENSIVE METABOLIC PANEL
ALT: 15 U/L (ref 14–54)
AST: 26 U/L (ref 15–41)
Albumin: 3.8 g/dL (ref 3.5–5.0)
Alkaline Phosphatase: 37 U/L — ABNORMAL LOW (ref 38–126)
Anion gap: 8 (ref 5–15)
BUN: 9 mg/dL (ref 6–20)
CHLORIDE: 106 mmol/L (ref 101–111)
CO2: 22 mmol/L (ref 22–32)
Calcium: 8.9 mg/dL (ref 8.9–10.3)
Creatinine, Ser: 0.83 mg/dL (ref 0.44–1.00)
Glucose, Bld: 113 mg/dL — ABNORMAL HIGH (ref 65–99)
POTASSIUM: 4.3 mmol/L (ref 3.5–5.1)
SODIUM: 136 mmol/L (ref 135–145)
Total Bilirubin: 0.4 mg/dL (ref 0.3–1.2)
Total Protein: 7.9 g/dL (ref 6.5–8.1)

## 2015-10-10 LAB — I-STAT BETA HCG BLOOD, ED (MC, WL, AP ONLY)

## 2015-10-10 MED ORDER — ONDANSETRON HCL 4 MG/2ML IJ SOLN
4.0000 mg | Freq: Once | INTRAMUSCULAR | Status: AC
  Administered 2015-10-10: 4 mg via INTRAVENOUS
  Filled 2015-10-10: qty 2

## 2015-10-10 MED ORDER — TRAMADOL HCL 50 MG PO TABS: 50.0000 mg | ORAL_TABLET | Freq: Four times a day (QID) | ORAL | Status: DC | PRN

## 2015-10-10 MED ORDER — SODIUM CHLORIDE 0.9 % IV BOLUS (SEPSIS)
1000.0000 mL | Freq: Once | INTRAVENOUS | Status: AC
  Administered 2015-10-10: 1000 mL via INTRAVENOUS

## 2015-10-10 MED ORDER — KETOROLAC TROMETHAMINE 30 MG/ML IJ SOLN
30.0000 mg | Freq: Once | INTRAMUSCULAR | Status: AC
  Administered 2015-10-10: 30 mg via INTRAVENOUS
  Filled 2015-10-10: qty 1

## 2015-10-10 MED ORDER — ALBUTEROL SULFATE HFA 108 (90 BASE) MCG/ACT IN AERS
2.0000 | INHALATION_SPRAY | RESPIRATORY_TRACT | Status: DC | PRN
Start: 2015-10-10 — End: 2015-10-10
  Filled 2015-10-10: qty 6.7

## 2015-10-10 MED ORDER — PROMETHAZINE HCL 25 MG PO TABS: 25.0000 mg | ORAL_TABLET | Freq: Four times a day (QID) | ORAL | Status: DC | PRN

## 2015-10-10 MED ORDER — AZITHROMYCIN 250 MG PO TABS: ORAL_TABLET | ORAL | Status: DC

## 2015-10-10 NOTE — ED Notes (Signed)
Two unsuccessful IV attempts by this nurse; Ajsa at bedside attempting IV start at present time.

## 2015-10-10 NOTE — Discharge Instructions (Signed)
Drink plenty of fluids. Follow-up in a week not improving

## 2015-10-10 NOTE — ED Notes (Addendum)
Pt reports ongoing URI symptoms (seen for same 10/12/2015) for a month; current complaint of migraine, nasal congestion, productive cough, and fever.

## 2015-10-10 NOTE — ED Provider Notes (Signed)
CSN: [id number]     Arrival date & time 10/10/15  0830 History   First MD Initiated Contact with Patient 10/10/15 0840     Chief Complaint  Patient presents with  . Flu Like Symptoms      (Consider location/radiation/quality/duration/timing/severity/associated sxs/prior Treatment) Patient is a 30 y.o. female presenting with cough. The history is provided by the patient (Patient complains of cough and congestion weakness).  Cough Cough characteristics:  Non-productive Severity:  Moderate Onset quality:  Gradual Timing:  Constant Progression:  Waxing and waning Chronicity:  Recurrent Associated symptoms: no chest pain, no eye discharge, no headaches and no rash     History reviewed. No pertinent past medical history. History reviewed. No pertinent past surgical history. Family History  Problem Relation Age of Onset  . Diabetes Mother   . Diabetes Other    Social History  Substance Use Topics  . Smoking status: Former Smoker    Types: Cigarettes  . Smokeless tobacco: None  . Alcohol Use: Yes     Comment: 5 shots liquor last night   OB History    No data available     Review of Systems  Constitutional: Negative for appetite change and fatigue.  HENT: Negative for congestion, ear discharge and sinus pressure.   Eyes: Negative for discharge.  Respiratory: Positive for cough.   Cardiovascular: Negative for chest pain.  Gastrointestinal: Negative for abdominal pain and diarrhea.  Genitourinary: Negative for frequency and hematuria.  Musculoskeletal: Negative for back pain.  Skin: Negative for rash.  Neurological: Negative for seizures and headaches.  Psychiatric/Behavioral: Negative for hallucinations.      Allergies  Shrimp  Home Medications   Prior to Admission medications   Medication Sig Start Date End Date Taking? Authorizing Provider  DM-Phenylephrine-Acetaminophen (VICKS DAYQUIL COLD & FLU) 10-5-325 MG CAPS Take 2 capsules by mouth 2 (two) times daily as  needed (for cold).   Yes Historical Provider, MD  Fluticasone-Salmeterol (ADVAIR DISKUS IN) Inhale 1-3 puffs into the lungs 3 (three) times daily as needed (for shortness of breath).   Yes Historical Provider, MD  guaifenesin (ROBITUSSIN) 100 MG/5ML syrup Take 200 mg by mouth every 4 (four) hours as needed for cough.   Yes Historical Provider, MD  Menthol (HALLS COUGH DROPS) 5.8 MG LOZG Use as directed 3-4 lozenges in the mouth or throat as needed (for cough (puts in orange juice)).   Yes Historical Provider, MD  Phenylephrine-Pheniramine-DM ([place]) 04-16-19 MG PACK Take 1 packet by mouth once.   Yes Historical Provider, MD  amoxicillin (AMOXIL) 500 MG capsule Take 1 capsule (500 mg total) by mouth 3 (three) times daily. Patient not taking: Reported on 10/10/2015 07/16/15   Fransico Meadow, PA-C  azithromycin (ZITHROMAX Z-PAK) 250 MG tablet 2 po day one, then 1 daily x 4 days 10/10/15   Milton Ferguson, MD  benzonatate (TESSALON) 100 MG capsule Take 1 capsule (100 mg total) by mouth every 8 (eight) hours. Patient not taking: Reported on 10/10/2015 09/11/15   Olivia Canter Sam, PA-C  fluticasone [facility]) 50 MCG/ACT nasal spray Place 2 sprays into both nostrils daily. Patient not taking: Reported on 10/10/2015 09/11/15   Olivia Canter Sam, PA-C  guaiFENesin (MUCINEX) 600 MG 12 hr tablet Take 1 tablet (600 mg total) by mouth 2 (two) times daily as needed for to loosen phlegm. Patient not taking: Reported on 10/10/2015 09/11/15   Olivia Canter Sam, PA-C  ibuprofen (ADVIL,MOTRIN) 800 MG tablet Take 1 tablet (800 mg total)  by mouth 3 (three) times daily. Patient not taking: Reported on 10/10/2015 09/11/15   Olivia Canter Sam, PA-C  promethazine (PHENERGAN) 25 MG tablet Take 1 tablet (25 mg total) by mouth every 6 (six) hours as needed for nausea or vomiting. 10/10/15   Milton Ferguson, MD  traMADol (ULTRAM) 50 MG tablet Take 1 tablet (50 mg total) by mouth every 6 (six) hours as needed. 10/10/15   Milton Ferguson, MD   BP  108/97 mmHg  Pulse 70  Temp(Src) 98.3 F (36.8 C) (Oral)  Resp 16  Ht 5' (1.524 m)  Wt 150 lb (68.04 kg)  BMI 29.30 kg/m2  SpO2 100%  LMP 08/25/2015 Physical Exam  Constitutional: She is oriented to person, place, and time. She appears well-developed.  HENT:  Head: Normocephalic.  Eyes: Conjunctivae and EOM are normal. No scleral icterus.  Neck: Neck supple. No thyromegaly present.  Cardiovascular: Normal rate and regular rhythm.  Exam reveals no gallop and no friction rub.   No murmur heard. Pulmonary/Chest: No stridor. She has wheezes. She has no rales. She exhibits no tenderness.  Abdominal: She exhibits no distension. There is no tenderness. There is no rebound.  Musculoskeletal: Normal range of motion. She exhibits no edema.  Lymphadenopathy:    She has no cervical adenopathy.  Neurological: She is oriented to person, place, and time. She exhibits normal muscle tone. Coordination normal.  Skin: No rash noted. No erythema.  Psychiatric: She has a normal mood and affect. Her behavior is normal.    ED Course  Procedures (including critical care time) Labs Review Labs Reviewed  CBC WITH DIFFERENTIAL/PLATELET - Abnormal; Notable for the following:    WBC 3.1 (*)    HCT 34.4 (*)    Neutro Abs 1.6 (*)    All other components within normal limits  COMPREHENSIVE METABOLIC PANEL - Abnormal; Notable for the following:    Glucose, Bld 113 (*)    Alkaline Phosphatase 37 (*)    All other components within normal limits  I-STAT BETA HCG BLOOD, ED ([facility], [facility], [facility] ONLY)    Imaging Review Dg Abd Acute W/chest  10/10/2015  CLINICAL DATA:  Productive cough. Shortness of breath. Mid chest pain. Vomiting. Nausea. Constipation. Diarrhea. EXAM: DG ABDOMEN ACUTE W/ 1V CHEST COMPARISON:  Chest dated 09/11/2015 and abdomen pelvis CT dated 06/17/2014. Pelvic ultrasound dated 03/09/2015. FINDINGS: Normal sized heart. Clear lungs. Mild central peribronchial thickening with improvement. Normal bowel  gas pattern without free peritoneal air. Normal amount of stool. Normal appearing bones. IMPRESSION: 1. Mild bronchitic changes with improvement. 2. No acute abdominal abnormality. Electronically Signed   By: Claudie Revering M.D.   On: 10/10/2015 10:18   I have personally reviewed and evaluated these images and lab results as part of my medical decision-making.   EKG Interpretation None      MDM   Final diagnoses:  Bronchitis    Patient with bronchitis myalgias. Patient will be put on Z-Pak Ultram and Phenergan and an albuterol inhaler. She will be followed up if not improving    Milton Ferguson, MD 10/10/15 1114

## 2015-10-13 ENCOUNTER — Encounter (HOSPITAL_COMMUNITY): Payer: Self-pay

## 2015-10-13 ENCOUNTER — Emergency Department (HOSPITAL_COMMUNITY): Payer: Self-pay

## 2015-10-13 ENCOUNTER — Emergency Department (HOSPITAL_COMMUNITY)
Admission: EM | Admit: 2015-10-13 | Discharge: 2015-10-13 | Disposition: A | Discharge status: Home / Self Care | Payer: Self-pay | Attending: Emergency Medicine | Admitting: Emergency Medicine

## 2015-10-13 DIAGNOSIS — J069 Acute upper respiratory infection, unspecified: Secondary | ICD-10-CM

## 2015-10-13 DIAGNOSIS — Z87891 Personal history of nicotine dependence: Secondary | ICD-10-CM | POA: Insufficient documentation

## 2015-10-13 MED ORDER — GUAIFENESIN-CODEINE 100-10 MG/5ML PO SYRP: 5.0000 mL | ORAL_SOLUTION | Freq: Three times a day (TID) | ORAL | Status: DC | PRN

## 2015-10-13 MED ORDER — PREDNISONE 20 MG PO TABS
60.0000 mg | ORAL_TABLET | Freq: Once | ORAL | Status: AC
  Administered 2015-10-13: 60 mg via ORAL
  Filled 2015-10-13: qty 3

## 2015-10-13 MED ORDER — KETOROLAC TROMETHAMINE 60 MG/2ML IM SOLN
60.0000 mg | Freq: Once | INTRAMUSCULAR | Status: AC
  Administered 2015-10-13: 60 mg via INTRAMUSCULAR
  Filled 2015-10-13: qty 2

## 2015-10-13 MED ORDER — PREDNISONE 50 MG PO TABS: ORAL_TABLET | ORAL | Status: DC

## 2015-10-13 MED ORDER — IPRATROPIUM-ALBUTEROL 0.5-2.5 (3) MG/3ML IN SOLN
3.0000 mL | Freq: Once | RESPIRATORY_TRACT | Status: AC
  Administered 2015-10-13: 3 mL via RESPIRATORY_TRACT
  Filled 2015-10-13: qty 3

## 2015-10-13 NOTE — ED Notes (Signed)
Pt with cough shortness of breath starting last week.  Dx 3 days ago with bronchitis.  Given tramadol, inhaler, and phenergan and cough/shortness of breath continues.

## 2015-10-13 NOTE — Discharge Instructions (Signed)
Upper Respiratory Infection, Adult Most upper respiratory infections (URIs) are a viral infection of the air passages leading to the lungs. A URI affects the nose, throat, and upper air passages. The most common type of URI is nasopharyngitis and is typically referred to as "the common cold." URIs run their course and usually go away on their own. Most of the time, a URI does not require medical attention, but sometimes a bacterial infection in the upper airways can follow a viral infection. This is called a secondary infection. Sinus and middle ear infections are common types of secondary upper respiratory infections. Bacterial pneumonia can also complicate a URI. A URI can worsen asthma and chronic obstructive pulmonary disease (COPD). Sometimes, these complications can require emergency medical care and may be life threatening.  CAUSES Almost all URIs are caused by viruses. A virus is a type of germ and can spread from one person to another.  RISKS FACTORS You may be at risk for a URI if:   You smoke.   You have chronic heart or lung disease.  You have a weakened defense (immune) system.   You are very young or very old.   You have nasal allergies or asthma.  You work in crowded or poorly ventilated areas.  You work in health care facilities or schools. SIGNS AND SYMPTOMS  Symptoms typically develop 2-3 days after you come in contact with a cold virus. Most viral URIs last 7-10 days. However, viral URIs from the influenza virus (flu virus) can last 14-18 days and are typically more severe. Symptoms may include:   Runny or stuffy (congested) nose.   Sneezing.   Cough.   Sore throat.   Headache.   Fatigue.   Fever.   Loss of appetite.   Pain in your forehead, behind your eyes, and over your cheekbones (sinus pain).  Muscle aches.  DIAGNOSIS  Your health care provider may diagnose a URI by:  Physical exam.  Tests to check that your symptoms are not due to  another condition such as:  Strep throat.  Sinusitis.  Pneumonia.  Asthma. TREATMENT  A URI goes away on its own with time. It cannot be cured with medicines, but medicines may be prescribed or recommended to relieve symptoms. Medicines may help:  Reduce your fever.  Reduce your cough.  Relieve nasal congestion. HOME CARE INSTRUCTIONS   Take medicines only as directed by your health care provider.   Gargle warm saltwater or take cough drops to comfort your throat as directed by your health care provider.  Use a warm mist humidifier or inhale steam from a shower to increase air moisture. This may make it easier to breathe.  Drink enough fluid to keep your urine clear or pale yellow.   Eat soups and other clear broths and maintain good nutrition.   Rest as needed.   Return to work when your temperature has returned to normal or as your health care provider advises. You may need to stay home longer to avoid infecting others. You can also use a face mask and careful hand washing to prevent spread of the virus.  Increase the usage of your inhaler if you have asthma.   Do not use any tobacco products, including cigarettes, chewing tobacco, or electronic cigarettes. If you need help quitting, ask your health care provider. PREVENTION  The best way to protect yourself from getting a cold is to practice good hygiene.   Avoid oral or hand contact with people with cold  symptoms.   Wash your hands often if contact occurs.  There is no clear evidence that vitamin C, vitamin E, echinacea, or exercise reduces the chance of developing a cold. However, it is always recommended to get plenty of rest, exercise, and practice good nutrition.  SEEK MEDICAL CARE IF:   You are getting worse rather than better.   Your symptoms are not controlled by medicine.   You have chills.  You have worsening shortness of breath.  You have brown or red mucus.  You have yellow or brown nasal  discharge.  You have pain in your face, especially when you bend forward.  You have a fever.  You have swollen neck glands.  You have pain while swallowing.  You have white areas in the back of your throat. SEEK IMMEDIATE MEDICAL CARE IF:   You have severe or persistent:  Headache.  Ear pain.  Sinus pain.  Chest pain.  You have chronic lung disease and any of the following:  Wheezing.  Prolonged cough.  Coughing up blood.  A change in your usual mucus.  You have a stiff neck.  You have changes in your:  Vision.  Hearing.  Thinking.  Mood. MAKE SURE YOU:   Understand these instructions.  Will watch your condition.  Will get help right away if you are not doing well or get worse.   This information is not intended to replace advice given to you by your health care provider. Make sure you discuss any questions you have with your health care provider.   Continue taking home antibiotics and using albuterol inhaler as needed. Discontinue taking tramadol. Take-home Phenergan only if nauseated. Use cough medication as needed. This has codeine in it, do not drive or drink alcohol while taking this medication. Take steroids as prescribed. Return to the ED if you experience fevers, chills, difficulty breathing.

## 2015-10-13 NOTE — ED Provider Notes (Signed)
CSN: [id number]     Arrival date & time 10/13/15  1100 History   First MD Initiated Contact with Patient 10/13/15 1118     Chief Complaint  Patient presents with  . Cough  . Shortness of Breath     (Consider location/radiation/quality/duration/timing/severity/associated sxs/prior Treatment) HPI   Carrie Guerra is a 30 y.o F with no significant pmhx who presents to the ED today c/o cough, congestion. Pt states that she has been experiencing URI like symptoms for 1 week. Pt reports productive cough with clear sputum, sore throat, subjective fevers, nasal congestion. Pt has pain in her chest with coughing and post-tussive emesis. She was seen in the ED 3 days ago and was given a Z-pak, phenergan, tramadol and albuterol inhaler. Pt states that the noly thing that helps is the inhaler. Pt states that the tramadol makes her sick to her stomach. Phenergan is helping her sleep. Pt states her fevers have resolved but the coughing and congestion still remain.   History reviewed. No pertinent past medical history. History reviewed. No pertinent past surgical history. Family History  Problem Relation Age of Onset  . Diabetes Mother   . Diabetes Other    Social History  Substance Use Topics  . Smoking status: Former Smoker    Types: Cigarettes  . Smokeless tobacco: None  . Alcohol Use: Yes     Comment: 5 shots liquor last night   OB History    No data available     Review of Systems  All other systems reviewed and are negative.     Allergies  Shrimp  Home Medications   Prior to Admission medications   Medication Sig Start Date End Date Taking? Authorizing Provider  amoxicillin (AMOXIL) 500 MG capsule Take 1 capsule (500 mg total) by mouth 3 (three) times daily. Patient not taking: Reported on 10/10/2015 07/16/15   Fransico Meadow, PA-C  azithromycin (ZITHROMAX Z-PAK) 250 MG tablet 2 po day one, then 1 daily x 4 days 10/10/15   Milton Ferguson, MD  benzonatate (TESSALON) 100 MG  capsule Take 1 capsule (100 mg total) by mouth every 8 (eight) hours. Patient not taking: Reported on 10/10/2015 09/11/15   Olivia Canter Sam, PA-C  DM-Phenylephrine-Acetaminophen [facility] DAYQUIL COLD & FLU) 10-5-325 MG CAPS Take 2 capsules by mouth 2 (two) times daily as needed (for cold).    Historical Provider, MD  fluticasone (FLONASE) 50 MCG/ACT nasal spray Place 2 sprays into both nostrils daily. Patient not taking: Reported on 10/10/2015 09/11/15   Olivia Canter Sam, PA-C  Fluticasone-Salmeterol (ADVAIR DISKUS IN) Inhale 1-3 puffs into the lungs 3 (three) times daily as needed (for shortness of breath).    Historical Provider, MD  guaiFENesin (MUCINEX) 600 MG 12 hr tablet Take 1 tablet (600 mg total) by mouth 2 (two) times daily as needed for to loosen phlegm. Patient not taking: Reported on 10/10/2015 09/11/15   Olivia Canter Sam, PA-C  guaifenesin (ROBITUSSIN) 100 MG/5ML syrup Take 200 mg by mouth every 4 (four) hours as needed for cough.    Historical Provider, MD  ibuprofen (ADVIL,MOTRIN) 800 MG tablet Take 1 tablet (800 mg total) by mouth 3 (three) times daily. Patient not taking: Reported on 10/10/2015 09/11/15   Olivia Canter Sam, PA-C  Menthol (HALLS COUGH DROPS) 5.8 MG LOZG Use as directed 3-4 lozenges in the mouth or throat as needed (for cough (puts in orange juice)).    Historical Provider, MD  Phenylephrine-Pheniramine-DM ([place]) 04-16-19 MG PACK Take  1 packet by mouth once.    Historical Provider, MD  promethazine (PHENERGAN) 25 MG tablet Take 1 tablet (25 mg total) by mouth every 6 (six) hours as needed for nausea or vomiting. 10/10/15   Milton Ferguson, MD  traMADol (ULTRAM) 50 MG tablet Take 1 tablet (50 mg total) by mouth every 6 (six) hours as needed. 10/10/15   Milton Ferguson, MD   BP 121/92 mmHg  Pulse 88  Temp(Src) 98 F (36.7 C) (Oral)  Resp 18  SpO2 100%  LMP 10/13/2015 Physical Exam  Constitutional: She is oriented to person, place, and time. She appears well-developed and  well-nourished. No distress.  HENT:  Head: Normocephalic and atraumatic.  Mouth/Throat: Oropharynx is clear and moist. No oropharyngeal exudate.  Boggy nasal mucosa   Eyes: Conjunctivae and EOM are normal. Pupils are equal, round, and reactive to light. Right eye exhibits no discharge. Left eye exhibits no discharge. No scleral icterus.  Neck: Neck supple.  Cardiovascular: Normal rate, regular rhythm, normal heart sounds and intact distal pulses.  Exam reveals no gallop and no friction rub.   No murmur heard. Pulmonary/Chest: Effort normal. No respiratory distress. She has wheezes ( mild expiratory wheezes in bilateral lower lung fields). She has no rales. She exhibits no tenderness.  Abdominal: Soft. She exhibits no distension. There is no tenderness. There is no guarding.  Musculoskeletal: Normal range of motion. She exhibits no edema.  Lymphadenopathy:    She has no cervical adenopathy.  Neurological: She is alert and oriented to person, place, and time.  Skin: Skin is warm and dry. No rash noted. She is not diaphoretic. No erythema. No pallor.  Psychiatric: She has a normal mood and affect. Her behavior is normal.  Nursing note and vitals reviewed.   ED Course  Procedures (including critical care time) Labs Review Labs Reviewed - No data to display  Imaging Review Dg Chest 2 View  10/13/2015  CLINICAL DATA:  30 year old with shortness breath and cough for 1.5 weeks. Central chest pain. EXAM: CHEST  2 VIEW COMPARISON:  09/11/2015 and 10/10/2015.  And FINDINGS: The heart size and mediastinal contours are normal. The lungs are clear. There is no pleural effusion or pneumothorax. No acute osseous findings are identified. IMPRESSION: Stable chest.  No active cardiopulmonary process. Electronically Signed   By: Richardean Sale M.D.   On: 10/13/2015 11:47   I have personally reviewed and evaluated these images and lab results as part of my medical decision-making.   EKG  Interpretation None      MDM   Final diagnoses:  URI (upper respiratory infection)    Otherwise healthy 30 year old female presents the ED with URI-like symptoms. Patient is nontoxic, nonseptic appearing. Afebrile, all vital signs are stable. Pt CXR negative for acute infiltrate. Pt was placed in Z-pak 3 days ago without symptomatic relief. Pt reports significant pain with coughing. Mild wheezes noted in bilateral lower lung fields. She was given 1 DuoNeb. Significant symptomatic improvement. Repeat lung exam clear to auscultation. Patient was given 1 dose of prednisone and Toradol injection for costochondritis pain. Pt will be discharged with a steroid burst and codeine cough medication for symptomatic control. Verbalizes understanding and is agreeable with plan. Pt is hemodynamically stable & in NAD prior to dc. She is requesting a work note. Return precautions outlined in patient discharge instructions.       Dondra Spry [place], PA-C 10/13/15 1544  Tanna Furry, MD 10/23/15 [place]

## 2015-10-14 ENCOUNTER — Encounter (HOSPITAL_COMMUNITY): Payer: Self-pay | Admitting: *Deleted

## 2015-10-14 ENCOUNTER — Emergency Department (HOSPITAL_COMMUNITY)
Admission: EM | Admit: 2015-10-14 | Discharge: 2015-10-15 | Disposition: A | Discharge status: Home / Self Care | Payer: Self-pay | Attending: Emergency Medicine | Admitting: Emergency Medicine

## 2015-10-14 DIAGNOSIS — Z7951 Long term (current) use of inhaled steroids: Secondary | ICD-10-CM | POA: Insufficient documentation

## 2015-10-14 DIAGNOSIS — Z792 Long term (current) use of antibiotics: Secondary | ICD-10-CM | POA: Insufficient documentation

## 2015-10-14 DIAGNOSIS — R1110 Vomiting, unspecified: Secondary | ICD-10-CM

## 2015-10-14 DIAGNOSIS — F17210 Nicotine dependence, cigarettes, uncomplicated: Secondary | ICD-10-CM | POA: Insufficient documentation

## 2015-10-14 DIAGNOSIS — Z7952 Long term (current) use of systemic steroids: Secondary | ICD-10-CM | POA: Insufficient documentation

## 2015-10-14 DIAGNOSIS — Z791 Long term (current) use of non-steroidal anti-inflammatories (NSAID): Secondary | ICD-10-CM | POA: Insufficient documentation

## 2015-10-14 DIAGNOSIS — R197 Diarrhea, unspecified: Secondary | ICD-10-CM | POA: Insufficient documentation

## 2015-10-14 DIAGNOSIS — B349 Viral infection, unspecified: Secondary | ICD-10-CM | POA: Insufficient documentation

## 2015-10-14 HISTORY — DX: Benign neoplasm of connective and other soft tissue, unspecified: D21.9

## 2015-10-14 LAB — COMPREHENSIVE METABOLIC PANEL
ALBUMIN: 4.5 g/dL (ref 3.5–5.0)
ALT: 19 U/L (ref 14–54)
ANION GAP: 11 (ref 5–15)
AST: 23 U/L (ref 15–41)
Alkaline Phosphatase: 40 U/L (ref 38–126)
BUN: 12 mg/dL (ref 6–20)
CO2: 22 mmol/L (ref 22–32)
Calcium: 9.5 mg/dL (ref 8.9–10.3)
Chloride: 113 mmol/L — ABNORMAL HIGH (ref 101–111)
Creatinine, Ser: 0.79 mg/dL (ref 0.44–1.00)
GFR calc Af Amer: >60 mL/min (ref >60)
GFR calc non Af Amer: >60 mL/min (ref >60)
GLUCOSE: 123 mg/dL — AB (ref 65–99)
POTASSIUM: 3.5 mmol/L (ref 3.5–5.1)
SODIUM: 146 mmol/L — AB (ref 135–145)
Total Bilirubin: 0.8 mg/dL (ref 0.3–1.2)
Total Protein: 8.7 g/dL — ABNORMAL HIGH (ref 6.5–8.1)

## 2015-10-14 LAB — CBC
HEMATOCRIT: 35.4 % — AB (ref 36.0–46.0)
HEMOGLOBIN: 12 g/dL (ref 12.0–15.0)
MCH: 29.1 pg (ref 26.0–34.0)
MCHC: 33.9 g/dL (ref 30.0–36.0)
MCV: 85.7 fL (ref 78.0–100.0)
Platelets: 366 10*3/uL (ref 150–400)
RBC: 4.13 MIL/uL (ref 3.87–5.11)
RDW: 12.1 % (ref 11.5–15.5)
WBC: 6.3 10*3/uL (ref 4.0–10.5)

## 2015-10-14 LAB — URINALYSIS, ROUTINE W REFLEX MICROSCOPIC
Glucose, UA: NEGATIVE mg/dL
Ketones, ur: NEGATIVE mg/dL
Leukocytes, UA: NEGATIVE
NITRITE: NEGATIVE
PH: 5.5 (ref 5.0–8.0)
Protein, ur: 100 mg/dL — AB
SPECIFIC GRAVITY, URINE: 1.041 — AB (ref 1.005–1.030)

## 2015-10-14 LAB — I-STAT BETA HCG BLOOD, ED (MC, WL, AP ONLY): I-stat hCG, quantitative: <5 m[IU]/mL (ref <5)

## 2015-10-14 LAB — URINE MICROSCOPIC-ADD ON

## 2015-10-14 LAB — LIPASE, BLOOD: Lipase: 30 U/L (ref 11–51)

## 2015-10-14 MED ORDER — KETOROLAC TROMETHAMINE 30 MG/ML IJ SOLN
30.0000 mg | Freq: Once | INTRAMUSCULAR | Status: AC
  Administered 2015-10-14: 30 mg via INTRAVENOUS
  Filled 2015-10-14: qty 1

## 2015-10-14 MED ORDER — FAMOTIDINE IN NACL 20-0.9 MG/50ML-% IV SOLN
20.0000 mg | Freq: Once | INTRAVENOUS | Status: AC
  Administered 2015-10-14: 20 mg via INTRAVENOUS
  Filled 2015-10-14: qty 50

## 2015-10-14 MED ORDER — METOCLOPRAMIDE HCL 5 MG/ML IJ SOLN
10.0000 mg | INTRAMUSCULAR | Status: AC
  Administered 2015-10-14: 10 mg via INTRAVENOUS
  Filled 2015-10-14: qty 2

## 2015-10-14 MED ORDER — DICYCLOMINE HCL 10 MG/ML IM SOLN
20.0000 mg | Freq: Once | INTRAMUSCULAR | Status: AC
  Administered 2015-10-14: 20 mg via INTRAMUSCULAR
  Filled 2015-10-14: qty 2

## 2015-10-14 MED ORDER — IPRATROPIUM-ALBUTEROL 0.5-2.5 (3) MG/3ML IN SOLN
3.0000 mL | Freq: Once | RESPIRATORY_TRACT | Status: AC
  Administered 2015-10-14: 3 mL via RESPIRATORY_TRACT
  Filled 2015-10-14: qty 3

## 2015-10-14 MED ORDER — ONDANSETRON 4 MG PO TBDP
4.0000 mg | ORAL_TABLET | Freq: Once | ORAL | Status: AC | PRN
  Administered 2015-10-14: 4 mg via ORAL
  Filled 2015-10-14: qty 1

## 2015-10-14 MED ORDER — LACTATED RINGERS IV BOLUS (SEPSIS)
1000.0000 mL | Freq: Once | INTRAVENOUS | Status: AC
Start: 2015-10-14 — End: 2015-10-15
  Administered 2015-10-14: 1000 mL via INTRAVENOUS

## 2015-10-14 NOTE — ED Provider Notes (Signed)
CSN: [id number]     Arrival date & time 10/14/15  2014 History   First MD Initiated Contact with Patient 10/14/15 2222     Chief Complaint  Patient presents with  . Emesis  . Diarrhea     (Consider location/radiation/quality/duration/timing/severity/associated sxs/prior Treatment) HPI Comments: 30 year old female with a history of fibroids presents to the emergency department for evaluation of nausea, vomiting, and diarrhea. She states that symptoms began at 6 AM today. She took Phenergan initially without relief. She also tried taking 2 aspirin tablets, but this worsened her symptoms. Patient also took Midol at 3 PM and Pepto-Bismol without relief. She has had too numerous to count episodes of emesis with bright red blood. She has also had too numerous to count episodes of watery and brown diarrhea. Patient reports chills without documented fever. She was seen and evaluated yesterday and diagnosed with an upper respiratory infection. No reported sick contacts. No history of abdominal surgeries.  Patient is a 30 y.o. female presenting with vomiting and diarrhea. The history is provided by the patient. No language interpreter was used.  Emesis Associated symptoms: chills and diarrhea   Diarrhea Associated symptoms: chills and vomiting   Associated symptoms: no fever     Past Medical History  Diagnosis Date  . Fibroids    History reviewed. No pertinent past surgical history. Family History  Problem Relation Age of Onset  . Diabetes Mother   . Diabetes Other    Social History  Substance Use Topics  . Smoking status: Current Every Day Smoker    Types: Cigarettes  . Smokeless tobacco: None  . Alcohol Use: Yes     Comment: socially   OB History    No data available      Review of Systems  Constitutional: Positive for chills. Negative for fever.  Respiratory: Positive for cough.   Gastrointestinal: Positive for vomiting and diarrhea.  Genitourinary: Negative for dysuria.   Neurological: Negative for syncope.  All other systems reviewed and are negative.   Allergies  Shrimp  Home Medications   Prior to Admission medications   Medication Sig Start Date End Date Taking? Authorizing Provider  Acetaminophen (PAIN RELIEVER PO) Take 2 tablets by mouth daily as needed (pain).   Yes Historical Provider, MD  Fluticasone-Salmeterol (ADVAIR DISKUS IN) Inhale 1-3 puffs into the lungs 3 (three) times daily as needed (for shortness of breath).   Yes Historical Provider, MD  Ibuprofen (MIDOL) 200 MG CAPS Take 400 mg by mouth every 4 (four) hours as needed (pain).   Yes Historical Provider, MD  promethazine (PHENERGAN) 25 MG tablet Take 1 tablet (25 mg total) by mouth every 6 (six) hours as needed for nausea or vomiting. 10/10/15  Yes Milton Ferguson, MD  traMADol (ULTRAM) 50 MG tablet Take 1 tablet (50 mg total) by mouth every 6 (six) hours as needed. 10/10/15  Yes Milton Ferguson, MD  amoxicillin (AMOXIL) 500 MG capsule Take 1 capsule (500 mg total) by mouth 3 (three) times daily. Patient not taking: Reported on 10/10/2015 07/16/15   Fransico Meadow, PA-C  azithromycin (ZITHROMAX Z-PAK) 250 MG tablet 2 po day one, then 1 daily x 4 days Patient not taking: Reported on 10/14/2015 10/10/15   Milton Ferguson, MD  benzonatate (TESSALON) 100 MG capsule Take 1 capsule (100 mg total) by mouth every 8 (eight) hours. Patient not taking: Reported on 10/10/2015 09/11/15   Olivia Canter Sam, PA-C  fluticasone [facility]) 50 MCG/ACT nasal spray Place 2 sprays into both nostrils  daily. Patient not taking: Reported on 10/10/2015 09/11/15   Olivia Canter Sam, PA-C  guaiFENesin (MUCINEX) 600 MG 12 hr tablet Take 1 tablet (600 mg total) by mouth 2 (two) times daily as needed for to loosen phlegm. Patient not taking: Reported on 10/10/2015 09/11/15   Olivia Canter Sam, PA-C  guaiFENesin-codeine [facility]) 100-10 MG/5ML syrup Take 5 mLs by mouth 3 (three) times daily as needed for cough. Patient not taking: Reported on  10/14/2015 10/13/15   Samantha Tripp Dowless, PA-C  ibuprofen (ADVIL,MOTRIN) 800 MG tablet Take 1 tablet (800 mg total) by mouth 3 (three) times daily. Patient not taking: Reported on 10/10/2015 09/11/15   Olivia Canter Sam, PA-C  predniSONE (DELTASONE) 50 MG tablet Take once daily for 5 days Patient not taking: Reported on 10/14/2015 10/13/15   Aldona Bar Tripp Dowless, PA-C   BP 142/84 mmHg  Pulse 76  Temp(Src) 98.5 F (36.9 C) (Oral)  Resp 18  SpO2 97%  LMP 10/13/2015   Physical Exam  Constitutional: She is oriented to person, place, and time. She appears well-developed and well-nourished. No distress.  Patient nontoxic appearing  HENT:  Head: Normocephalic and atraumatic.  Eyes: Conjunctivae and EOM are normal. No scleral icterus.  Neck: Normal range of motion.  Cardiovascular: Normal rate, regular rhythm and intact distal pulses.   Pulmonary/Chest: Effort normal and breath sounds normal. No respiratory distress. She has no wheezes. She has no rales.  Lungs CTAB. Dry, nonproductive cough at bedside.  Abdominal: Soft. She exhibits no distension. There is tenderness. There is no rebound.  Soft, obese abdomen. Mild diffuse TTP. No masses or rigidity. No peritoneal signs.  Musculoskeletal: Normal range of motion.  Neurological: She is alert and oriented to person, place, and time. She exhibits normal muscle tone. Coordination normal.  Patient moving all extremities.  Skin: Skin is warm and dry. No rash noted. She is not diaphoretic. No erythema. No pallor.  Psychiatric: She has a normal mood and affect. Her behavior is normal.  Nursing note and vitals reviewed.    ED Course  Procedures (including critical care time) Labs Review Labs Reviewed  COMPREHENSIVE METABOLIC PANEL - Abnormal; Notable for the following:    Sodium 146 (*)    Chloride 113 (*)    Glucose, Bld 123 (*)    Total Protein 8.7 (*)    All other components within normal limits  CBC - Abnormal; Notable for the following:     HCT 35.4 (*)    All other components within normal limits  URINALYSIS, ROUTINE W REFLEX MICROSCOPIC (NOT AT [facility]) - Abnormal; Notable for the following:    Color, Urine AMBER (*)    APPearance CLOUDY (*)    Specific Gravity, Urine 1.041 (*)    Hgb urine dipstick TRACE (*)    Bilirubin Urine SMALL (*)    Protein, ur 100 (*)    All other components within normal limits  URINE MICROSCOPIC-ADD ON - Abnormal; Notable for the following:    Squamous Epithelial / LPF 0-5 (*)    Bacteria, UA RARE (*)    All other components within normal limits  LIPASE, BLOOD  I-STAT BETA HCG BLOOD, ED ([facility], [facility], [facility] ONLY)    Imaging Review Dg Chest 2 View  10/13/2015  CLINICAL DATA:  30 year old with shortness breath and cough for 1.5 weeks. Central chest pain. EXAM: CHEST  2 VIEW COMPARISON:  09/11/2015 and 10/10/2015.  And FINDINGS: The heart size and mediastinal contours are normal. The lungs are clear. There  is no pleural effusion or pneumothorax. No acute osseous findings are identified. IMPRESSION: Stable chest.  No active cardiopulmonary process. Electronically Signed   By: Richardean Sale M.D.   On: 10/13/2015 11:47   I have personally reviewed and evaluated these images and lab results as part of my medical decision-making.   EKG Interpretation None       12:38 AM Patient denies N/V. She has no c/o abdominal pain. Repeat exam with no abdominal tenderness. Patient has tolerated Sprite without further emesis. She is comfortable with discharge and outpatient management.   Medications  albuterol (PROVENTIL HFA;VENTOLIN HFA) 108 (90 Base) MCG/ACT inhaler 2 puff (not administered)  ondansetron (ZOFRAN-ODT) disintegrating tablet 4 mg (4 mg Oral Given 10/14/15 2042)  lactated ringers bolus 1,000 mL (0 mLs Intravenous Stopped 10/15/15 0047)  metoCLOPramide (REGLAN) injection 10 mg (10 mg Intravenous Given 10/14/15 2325)  dicyclomine (BENTYL) injection 20 mg (20 mg Intramuscular Given 10/14/15 2325)   famotidine (PEPCID) IVPB 20 mg premix (0 mg Intravenous Stopped 10/14/15 2334)  ketorolac (TORADOL) 30 MG/ML injection 30 mg (30 mg Intravenous Given 10/14/15 2325)  ipratropium-albuterol (DUONEB) 0.5-2.5 (3) MG/3ML nebulizer solution 3 mL (3 mLs Nebulization Given 10/14/15 2332)  sodium chloride 0.9 % bolus 1,000 mL (1,000 mLs Intravenous New Bag/Given 10/15/15 0047)    MDM   Final diagnoses:  Vomiting and diarrhea  Viral syndrome    Patient with symptoms consistent with viral gastroenteritis. Vitals are stable, no fever. No signs of dehydration, tolerating PO fluids > 6 oz. Lungs are clear. No focal abdominal pain. Doubt appendicitis, cholecystitis, pancreatitis, ruptured viscus, UTI, kidney stone, or any other abdominal etiology. Supportive therapy indicated with return if symptoms worsen. Return precautions discussed and provided. Patient discharged in satisfactory condition with no unaddressed concerns.   Filed Vitals:   10/14/15 2036 10/14/15 2227 10/15/15 0045  BP: 157/117 142/84 134/78  Pulse: 86 76 77  Temp: 98.5 F (36.9 C)    TempSrc: Oral    Resp: 24 18 18   SpO2: 100% 97% 100%     Antonietta Breach, PA-C 10/15/15 0057  Julianne Rice, MD 10/17/15 [phone]

## 2015-10-14 NOTE — ED Notes (Addendum)
Pt states that she began having N/V/D this am around 8am; pt states that she has been vomiting all day; pt states that she has vomited numerous times; pt c/o chills and shaking; pt was seen yesterday and was diagnosed with URI

## 2015-10-15 MED ORDER — DICYCLOMINE HCL 20 MG PO TABS: 20.0000 mg | ORAL_TABLET | Freq: Two times a day (BID) | ORAL | Status: DC

## 2015-10-15 MED ORDER — SODIUM CHLORIDE 0.9 % IV BOLUS (SEPSIS)
1000.0000 mL | Freq: Once | INTRAVENOUS | Status: AC
  Administered 2015-10-15: 1000 mL via INTRAVENOUS

## 2015-10-15 MED ORDER — ONDANSETRON HCL 4 MG PO TABS
4.0000 mg | ORAL_TABLET | Freq: Four times a day (QID) | ORAL | Status: DC | PRN
Start: 2015-10-15 — End: 2016-03-24

## 2015-10-15 MED ORDER — FAMOTIDINE 20 MG PO TABS: 20.0000 mg | ORAL_TABLET | Freq: Two times a day (BID) | ORAL | Status: DC

## 2015-10-15 MED ORDER — ALBUTEROL SULFATE HFA 108 (90 BASE) MCG/ACT IN AERS
2.0000 | INHALATION_SPRAY | Freq: Once | RESPIRATORY_TRACT | Status: AC
  Administered 2015-10-15: 2 via RESPIRATORY_TRACT
  Filled 2015-10-15: qty 6.7

## 2015-10-15 NOTE — Discharge Instructions (Signed)
Influenza, Adult Influenza ("the flu") is a viral infection of the respiratory tract. It occurs more often in winter months because people spend more time in close contact with one another. Influenza can make you feel very sick. Influenza easily spreads from person to person (contagious). CAUSES  Influenza is caused by a virus that infects the respiratory tract. You can catch the virus by breathing in droplets from an infected person's cough or sneeze. You can also catch the virus by touching something that was recently contaminated with the virus and then touching your mouth, nose, or eyes. RISKS AND COMPLICATIONS You may be at risk for a more severe case of influenza if you smoke cigarettes, have diabetes, have chronic heart disease (such as heart failure) or lung disease (such as asthma), or if you have a weakened immune system. Elderly people and pregnant women are also at risk for more serious infections. The most common problem of influenza is a lung infection (pneumonia). Sometimes, this problem can require emergency medical care and may be life threatening. SIGNS AND SYMPTOMS  Symptoms typically last 4 to 10 days and may include:  Fever.  Chills.  Headache, body aches, and muscle aches.  Sore throat.  Chest discomfort and cough.  Poor appetite.  Weakness or feeling tired.  Dizziness.  Nausea or vomiting. DIAGNOSIS  Diagnosis of influenza is often made based on your history and a physical exam. A nose or throat swab test can be done to confirm the diagnosis. TREATMENT  In mild cases, influenza goes away on its own. Treatment is directed at relieving symptoms. For more severe cases, your health care provider may prescribe antiviral medicines to shorten the sickness. Antibiotic medicines are not effective because the infection is caused by a virus, not by bacteria. HOME CARE INSTRUCTIONS  Take medicines only as directed by your health care provider.  Use a cool mist humidifier  to make breathing easier.  Get plenty of rest until your temperature returns to normal. This usually takes 3 to 4 days.  Drink enough fluid to keep your urine clear or pale yellow.  Cover yourmouth and nosewhen coughing or sneezing,and wash your handswellto prevent thevirusfrom spreading.  Stay homefromwork orschool untilthe fever is gonefor at least 11full day. PREVENTION  An annual influenza vaccination (flu shot) is the best way to avoid getting influenza. An annual flu shot is now routinely recommended for all adults in the [place] IF:  You experiencechest pain, yourcough worsens,or you producemore mucus.  Youhave nausea,vomiting, ordiarrhea.  Your fever returns or gets worse. SEEK IMMEDIATE MEDICAL CARE IF:  You havetrouble breathing, you become short of breath,or your skin ornails becomebluish.  You have severe painor stiffnessin the neck.  You develop a sudden headache, or pain in the face or ear.  You have nausea or vomiting that you cannot control. MAKE SURE YOU:   Understand these instructions.  Will watch your condition.  Will get help right away if you are not doing well or get worse.   This information is not intended to replace advice given to you by your health care provider. Make sure you discuss any questions you have with your health care provider.   Document Released: 06/10/2000 Document Revised: 07/04/2014 Document Reviewed: 09/12/2011 Elsevier Interactive Patient Education 2016 Elsevier Inc[employer]  Viral Gastroenteritis Viral gastroenteritis is also called stomach flu. This illness is caused by a certain type of germ (virus). It can cause sudden watery poop (diarrhea) and throwing up (vomiting). This can cause  you to lose body fluids (dehydration). This illness usually lasts for 3 to 8 days. It usually goes away on its own. HOME CARE   Drink enough fluids to keep your pee (urine) clear or pale yellow. Drink small  amounts of fluids often.  Ask your doctor how to replace body fluid losses (rehydration).  Avoid:  Foods high in sugar.  Alcohol.  Bubbly (carbonated) drinks.  Tobacco.  Juice.  Caffeine drinks.  Very hot or cold fluids.  Fatty, greasy foods.  Eating too much at one time.  Dairy products until 24 to 48 hours after your watery poop stops.  You may eat foods with active cultures (probiotics). They can be found in some yogurts and supplements.  Wash your hands well to avoid spreading the illness.  Only take medicines as told by your doctor. Do not give aspirin to children. Do not take medicines for watery poop (antidiarrheals).  Ask your doctor if you should keep taking your regular medicines.  Keep all doctor visits as told. GET HELP RIGHT AWAY IF:   You cannot keep fluids down.  You do not pee at least once every 6 to 8 hours.  You are short of breath.  You see blood in your poop or throw up. This may look like coffee grounds.  You have belly (abdominal) pain that gets worse or is just in one small spot (localized).  You keep throwing up or having watery poop.  You have a fever.  The patient is a child younger than 3 months, and he or she has a fever.  The patient is a child older than 3 months, and he or she has a fever and problems that do not go away.  The patient is a child older than 3 months, and he or she has a fever and problems that suddenly get worse.  The patient is a baby, and he or she has no tears when crying. MAKE SURE YOU:   Understand these instructions.  Will watch your condition.  Will get help right away if you are not doing well or get worse.   This information is not intended to replace advice given to you by your health care provider. Make sure you discuss any questions you have with your health care provider.   Document Released: 11/30/2007 Document Revised: 09/05/2011 Document Reviewed: 03/30/2011 Elsevier Interactive  Patient Education [employer].

## 2016-03-24 ENCOUNTER — Encounter (HOSPITAL_BASED_OUTPATIENT_CLINIC_OR_DEPARTMENT_OTHER): Payer: Self-pay

## 2016-03-24 ENCOUNTER — Emergency Department (HOSPITAL_BASED_OUTPATIENT_CLINIC_OR_DEPARTMENT_OTHER)
Admission: EM | Admit: 2016-03-24 | Discharge: 2016-03-24 | Disposition: A | Discharge status: Home / Self Care | Payer: Self-pay | Attending: Emergency Medicine | Admitting: Emergency Medicine

## 2016-03-24 DIAGNOSIS — J069 Acute upper respiratory infection, unspecified: Secondary | ICD-10-CM | POA: Insufficient documentation

## 2016-03-24 DIAGNOSIS — R110 Nausea: Secondary | ICD-10-CM | POA: Insufficient documentation

## 2016-03-24 DIAGNOSIS — F17210 Nicotine dependence, cigarettes, uncomplicated: Secondary | ICD-10-CM | POA: Insufficient documentation

## 2016-03-24 LAB — RAPID STREP SCREEN (MED CTR MEBANE ONLY): STREPTOCOCCUS, GROUP A SCREEN (DIRECT): NEGATIVE

## 2016-03-24 MED ORDER — NAPROXEN 500 MG PO TABS: 500.0000 mg | ORAL_TABLET | Freq: Two times a day (BID) | ORAL | 0 refills | Status: DC

## 2016-03-24 MED ORDER — BENZONATATE 100 MG PO CAPS: 100.0000 mg | ORAL_CAPSULE | Freq: Three times a day (TID) | ORAL | 0 refills | Status: DC

## 2016-03-24 MED ORDER — ONDANSETRON 4 MG PO TBDP: 4.0000 mg | ORAL_TABLET | Freq: Three times a day (TID) | ORAL | 0 refills | Status: DC | PRN

## 2016-03-24 NOTE — Discharge Instructions (Signed)
Your symptoms are consistent with a viral illness. Viruses do not require antibiotics. Treatment is symptomatic care. Drink plenty of fluids and get plenty of rest. You should be drinking at least a one half to one liter of water an hour to stay hydrated. Ibuprofen, Naproxen, or Tylenol for pain or fever. Zofran for nausea. Tessalon for cough. Plain Mucinex may help relieve congestion. Warm liquids or Chloraseptic spray may help soothe a sore throat.

## 2016-03-24 NOTE — ED Provider Notes (Signed)
[facility] DEPT Provider Note   CSN: [id number] Arrival date & time: 03/24/16  1112     History   Chief Complaint Chief Complaint  Patient presents with  . Sore Throat    HPI Carrie Guerra is a 30 y.o. female.  HPI   Carrie Guerra is a 30 y.o. female, patient with no pertinent past medical history, presenting to the ED with nonproductive cough, sore throat, chills, and nausea for the last 3 days. Pain is moderate, sharp, nonradiating. Patient has not tried any medications for her symptoms. Patient denies fever, difficulty breathing or swallowing, voice change, or any other complaints.     Past Medical History:  Diagnosis Date  . Fibroids     There are no active problems to display for this patient.   History reviewed. No pertinent surgical history.  OB History    No data available       Home Medications    Prior to Admission medications   Medication Sig Start Date End Date Taking? Authorizing Provider  benzonatate (TESSALON) 100 MG capsule Take 1 capsule (100 mg total) by mouth every 8 (eight) hours. 03/24/16   Lyrah Bradt C Amani Nodarse, PA-C  naproxen (NAPROSYN) 500 MG tablet Take 1 tablet (500 mg total) by mouth 2 (two) times daily. 03/24/16   Breslin Burklow C Jennifer Holland, PA-C  ondansetron (ZOFRAN ODT) 4 MG disintegrating tablet Take 1 tablet (4 mg total) by mouth every 8 (eight) hours as needed for nausea or vomiting. 03/24/16   Lorayne Bender, PA-C    Family History Family History  Problem Relation Age of Onset  . Diabetes Mother   . Diabetes Other     Social History Social History  Substance Use Topics  . Smoking status: Current Every Day Smoker    Types: Cigarettes  . Smokeless tobacco: Never Used  . Alcohol use Yes     Comment: socially     Allergies   Shrimp [shellfish allergy]   Review of Systems Review of Systems  Constitutional: Positive for chills. Negative for fever.  HENT: Positive for sore throat. Negative for drooling, trouble swallowing and voice change.    Respiratory: Positive for cough. Negative for shortness of breath.   Cardiovascular: Negative for chest pain.  Gastrointestinal: Positive for nausea. Negative for abdominal pain, diarrhea and vomiting.  All other systems reviewed and are negative.    Physical Exam Updated Vital Signs BP 120/81 (BP Location: Left Arm)   Pulse 100   Temp 99.4 F (37.4 C) (Oral)   Resp 18   Ht 5' (1.524 m)   Wt 70.3 kg   LMP 02/26/2016   SpO2 99%   BMI 30.27 kg/m   Physical Exam  Constitutional: She appears well-developed and well-nourished. No distress.  HENT:  Head: Normocephalic and atraumatic.  Mouth/Throat: Uvula is midline and mucous membranes are normal. Posterior oropharyngeal edema and posterior oropharyngeal erythema present.  Eyes: Conjunctivae are normal.  Neck: Neck supple.  Cardiovascular: Normal rate, regular rhythm, normal heart sounds and intact distal pulses.   Pulmonary/Chest: Effort normal and breath sounds normal. No respiratory distress.  Abdominal: Soft. There is no tenderness. There is no guarding.  Musculoskeletal: She exhibits no edema or tenderness.  Lymphadenopathy:    She has no cervical adenopathy.  Neurological: She is alert.  Skin: Skin is warm and dry. She is not diaphoretic.  Psychiatric: She has a normal mood and affect. Her behavior is normal.  Nursing note and vitals reviewed.    ED Treatments / Results  Labs (all labs ordered are listed, but only abnormal results are displayed) Labs Reviewed  RAPID STREP SCREEN (NOT AT [facility])  CULTURE, GROUP A STREP [facility])    EKG  EKG Interpretation None       Radiology No results found.  Procedures Procedures (including critical care time)  Medications Ordered in ED Medications - No data to display   Initial Impression / Assessment and Plan / ED Course  I have reviewed the triage vital signs and the nursing notes.  Pertinent labs & imaging results that were available during my care of the  patient were reviewed by me and considered in my medical decision making (see chart for details).  Clinical Course   Patient presents with URI symptoms for the past 3 days. Patient is well-appearing and vital signs give no suspicion for sepsis or other serious illness. Home care and return precautions discussed.    Final Clinical Impressions(s) / ED Diagnoses   Final diagnoses:  URI (upper respiratory infection)    New Prescriptions Discharge Medication List as of 03/24/2016 12:02 PM    START taking these medications   Details  benzonatate (TESSALON) 100 MG capsule Take 1 capsule (100 mg total) by mouth every 8 (eight) hours., Starting Thu 03/24/2016, Print    naproxen (NAPROSYN) 500 MG tablet Take 1 tablet (500 mg total) by mouth 2 (two) times daily., Starting Thu 03/24/2016, Print    ondansetron (ZOFRAN ODT) 4 MG disintegrating tablet Take 1 tablet (4 mg total) by mouth every 8 (eight) hours as needed for nausea or vomiting., Starting Thu 03/24/2016, Print         Lorayne Bender, PA-C 03/25/16 [id number]    Fredia Sorrow, MD 03/26/16 1624

## 2016-03-24 NOTE — ED Triage Notes (Signed)
Sore throat x 3 days-NAD-steady gait

## 2016-03-26 LAB — CULTURE, GROUP A STREP (THRC)

## 2016-04-30 ENCOUNTER — Encounter (HOSPITAL_BASED_OUTPATIENT_CLINIC_OR_DEPARTMENT_OTHER): Payer: Self-pay | Admitting: Emergency Medicine

## 2016-04-30 ENCOUNTER — Emergency Department (HOSPITAL_BASED_OUTPATIENT_CLINIC_OR_DEPARTMENT_OTHER)
Admission: EM | Admit: 2016-04-30 | Discharge: 2016-04-30 | Disposition: A | Discharge status: Home / Self Care | Payer: Self-pay | Attending: Emergency Medicine | Admitting: Emergency Medicine

## 2016-04-30 DIAGNOSIS — T7840XA Allergy, unspecified, initial encounter: Secondary | ICD-10-CM

## 2016-04-30 DIAGNOSIS — L299 Pruritus, unspecified: Secondary | ICD-10-CM | POA: Insufficient documentation

## 2016-04-30 DIAGNOSIS — T781XXA Other adverse food reactions, not elsewhere classified, initial encounter: Secondary | ICD-10-CM | POA: Insufficient documentation

## 2016-04-30 DIAGNOSIS — F17210 Nicotine dependence, cigarettes, uncomplicated: Secondary | ICD-10-CM | POA: Insufficient documentation

## 2016-04-30 MED ORDER — PREDNISONE 10 MG PO TABS: 30.0000 mg | ORAL_TABLET | Freq: Every day | ORAL | 0 refills | Status: AC

## 2016-04-30 MED ORDER — RANITIDINE HCL 150 MG PO CAPS: 150.0000 mg | ORAL_CAPSULE | Freq: Every day | ORAL | 0 refills | Status: DC

## 2016-04-30 MED ORDER — EPINEPHRINE 0.3 MG/0.3ML IJ SOAJ
0.3000 mg | Freq: Once | INTRAMUSCULAR | Status: AC
  Administered 2016-04-30: 0.3 mg via INTRAMUSCULAR
  Filled 2016-04-30: qty 0.3

## 2016-04-30 MED ORDER — METHYLPREDNISOLONE SODIUM SUCC 125 MG IJ SOLR
125.0000 mg | Freq: Once | INTRAMUSCULAR | Status: AC
  Administered 2016-04-30: 125 mg via INTRAVENOUS
  Filled 2016-04-30: qty 2

## 2016-04-30 MED ORDER — DIPHENHYDRAMINE HCL 25 MG PO CAPS: 25.0000 mg | ORAL_CAPSULE | Freq: Four times a day (QID) | ORAL | 0 refills | Status: DC | PRN

## 2016-04-30 MED ORDER — FAMOTIDINE IN NACL 20-0.9 MG/50ML-% IV SOLN
20.0000 mg | Freq: Once | INTRAVENOUS | Status: AC
  Administered 2016-04-30: 20 mg via INTRAVENOUS
  Filled 2016-04-30: qty 50

## 2016-04-30 MED ORDER — ALBUTEROL SULFATE (2.5 MG/3ML) 0.083% IN NEBU
5.0000 mg | INHALATION_SOLUTION | Freq: Once | RESPIRATORY_TRACT | Status: AC
  Administered 2016-04-30: 5 mg via RESPIRATORY_TRACT
  Filled 2016-04-30: qty 6

## 2016-04-30 MED ORDER — DIPHENHYDRAMINE HCL 50 MG/ML IJ SOLN
25.0000 mg | Freq: Once | INTRAMUSCULAR | Status: AC
  Administered 2016-04-30: 25 mg via INTRAVENOUS
  Filled 2016-04-30: qty 1

## 2016-04-30 MED ORDER — EPINEPHRINE 0.3 MG/0.3ML IJ SOAJ: 0.3000 mg | Freq: Once | INTRAMUSCULAR | 1 refills | Status: AC

## 2016-04-30 NOTE — ED Notes (Signed)
Patient denies pain.

## 2016-04-30 NOTE — ED Notes (Signed)
Denies itching, hives improved, no c/o's . Visitor at bedside

## 2016-04-30 NOTE — ED Triage Notes (Addendum)
Pt reports eating cabbage at noon and now having itching all over and swelling in her throat. Pt able to speak in complete sentences. Pt took 2 benadryl PTA

## 2016-04-30 NOTE — ED Notes (Signed)
States is feeling better, itching has decreased

## 2016-04-30 NOTE — ED Provider Notes (Signed)
[facility]PT [facility] Provider Note   CSN: [id number] Arrival date & time: 04/30/16  1328     History   Chief Complaint Chief Complaint  Patient presents with  . Allergic Reaction    HPI Carrie Guerra is a 30 y.o. female.  The history is provided by the patient.  Allergic Reaction  Presenting symptoms: difficulty breathing (throat feels a little tight), itching and rash   Presenting symptoms: no difficulty swallowing and no wheezing   Severity:  Severe Duration:  2 hours Prior allergic episodes:  Food/nut allergies Context comment:  She ate food from a Art therapist, she is not sure how they prepared it, she has not had an allergic reaction since she was a child   Past Medical History:  Diagnosis Date  . Fibroids     There are no active problems to display for this patient.   History reviewed. No pertinent surgical history.  OB History    No data available       Home Medications    Prior to Admission medications   Medication Sig Start Date End Date Taking? Authorizing Provider  benzonatate (TESSALON) 100 MG capsule Take 1 capsule (100 mg total) by mouth every 8 (eight) hours. 03/24/16   Shawn C Joy, PA-C  naproxen (NAPROSYN) 500 MG tablet Take 1 tablet (500 mg total) by mouth 2 (two) times daily. 03/24/16   Shawn C Joy, PA-C  ondansetron (ZOFRAN ODT) 4 MG disintegrating tablet Take 1 tablet (4 mg total) by mouth every 8 (eight) hours as needed for nausea or vomiting. 03/24/16   Lorayne Bender, PA-C    Family History Family History  Problem Relation Age of Onset  . Diabetes Mother   . Diabetes Other     Social History Social History  Substance Use Topics  . Smoking status: Current Every Day Smoker    Types: Cigarettes  . Smokeless tobacco: Never Used  . Alcohol use Yes     Comment: socially     Allergies   Shrimp [shellfish allergy]   Review of Systems Review of Systems  HENT: Negative for trouble swallowing.   Respiratory: Negative for wheezing.    Skin: Positive for itching and rash.  All other systems reviewed and are negative.    Physical Exam Updated Vital Signs BP 120/85 (BP Location: Right Arm)   Pulse 78   Temp 98.5 F (36.9 C) (Oral)   Resp 17   LMP 04/26/2016   SpO2 98%   Physical Exam  Constitutional: She appears well-developed and well-nourished. No distress.  HENT:  Head: Normocephalic and atraumatic.  Right Ear: External ear normal.  Left Ear: External ear normal.  Eyes: Conjunctivae are normal. Right eye exhibits no discharge. Left eye exhibits no discharge. No scleral icterus.  Neck: Neck supple. No tracheal deviation present.  Cardiovascular: Normal rate, regular rhythm and intact distal pulses.   Pulmonary/Chest: Effort normal and breath sounds normal. No stridor. No respiratory distress. She has no wheezes. She has no rales.  Abdominal: Soft. Bowel sounds are normal. She exhibits no distension. There is no tenderness. There is no rebound and no guarding.  Musculoskeletal: She exhibits no edema or tenderness.  Neurological: She is alert. She has normal strength. No cranial nerve deficit (no facial droop, extraocular movements intact, no slurred speech) or sensory deficit. She exhibits normal muscle tone. She displays no seizure activity. Coordination normal.  Skin: Skin is warm and dry. No rash noted.  Psychiatric: She has a normal mood and  affect.  Nursing note and vitals reviewed.    ED Treatments / Results    Procedures .Critical Care Performed by: Dorie Rank Authorized by: Dorie Rank   Critical care provider statement:    Critical care time (minutes):  35   Critical care was necessary to treat or prevent imminent or life-threatening deterioration of the following conditions: allergic reaction.   Critical care was time spent personally by me on the following activities:  Discussions with consultants, evaluation of patient's response to treatment, examination of patient, ordering and performing  treatments and interventions, ordering and review of laboratory studies, ordering and review of radiographic studies, pulse oximetry, re-evaluation of patient's condition and obtaining history from patient or surrogate   (including critical care time)  Medications Ordered in ED Medications  diphenhydrAMINE (BENADRYL) injection 25 mg (25 mg Intravenous Given 04/30/16 1430)  EPINEPHrine (EPI-PEN) injection 0.3 mg (0.3 mg Intramuscular Given 04/30/16 1430)  famotidine (PEPCID) IVPB 20 mg premix (0 mg Intravenous Stopped 04/30/16 1506)  methylPREDNISolone sodium succinate (SOLU-MEDROL) 125 mg/2 mL injection 125 mg (125 mg Intravenous Given 04/30/16 1430)  albuterol (PROVENTIL) (2.5 MG/3ML) 0.083% nebulizer solution 5 mg (5 mg Nebulization Given 04/30/16 1426)     Initial Impression / Assessment and Plan / ED Course  I have reviewed the triage vital signs and the nursing notes.  Pertinent labs & imaging results that were available during my care of the patient were reviewed by me and considered in my medical decision making (see chart for details).  Clinical Course   Pt treated for anaphylactic reaction.  She has remained stable in the ED.  Will continue to monitor.  Case turned over to Dr Billy Fischer  Final Clinical Impressions(s) / ED Diagnoses   Final diagnoses:  Allergic reaction, initial encounter      Dorie Rank, MD 04/30/16 1649

## 2016-04-30 NOTE — ED Notes (Signed)
RT in for eval

## 2016-04-30 NOTE — ED Notes (Signed)
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 Took Benadryl 2 tabs PTA

## 2016-09-08 ENCOUNTER — Encounter: Payer: Self-pay | Admitting: Obstetrics and Gynecology

## 2016-09-08 NOTE — Progress Notes (Signed)
Patient did not keep GYN appointment for 09/08/2016.  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

## 2016-10-05 ENCOUNTER — Emergency Department (HOSPITAL_COMMUNITY)
Admission: EM | Admit: 2016-10-05 | Discharge: 2016-10-05 | Disposition: A | Discharge status: Home / Self Care | Payer: Self-pay | Attending: Emergency Medicine | Admitting: Emergency Medicine

## 2016-10-05 ENCOUNTER — Emergency Department (HOSPITAL_COMMUNITY): Payer: Self-pay

## 2016-10-05 ENCOUNTER — Encounter (HOSPITAL_COMMUNITY): Payer: Self-pay | Admitting: *Deleted

## 2016-10-05 DIAGNOSIS — J069 Acute upper respiratory infection, unspecified: Secondary | ICD-10-CM

## 2016-10-05 DIAGNOSIS — B9789 Other viral agents as the cause of diseases classified elsewhere: Secondary | ICD-10-CM

## 2016-10-05 DIAGNOSIS — Z7982 Long term (current) use of aspirin: Secondary | ICD-10-CM | POA: Insufficient documentation

## 2016-10-05 DIAGNOSIS — M940 Chondrocostal junction syndrome [Tietze]: Secondary | ICD-10-CM | POA: Insufficient documentation

## 2016-10-05 DIAGNOSIS — F17210 Nicotine dependence, cigarettes, uncomplicated: Secondary | ICD-10-CM | POA: Insufficient documentation

## 2016-10-05 LAB — CBC
HEMATOCRIT: 33.2 % — AB (ref 36.0–46.0)
HEMOGLOBIN: 10.9 g/dL — AB (ref 12.0–15.0)
MCH: 28.9 pg (ref 26.0–34.0)
MCHC: 32.8 g/dL (ref 30.0–36.0)
MCV: 88.1 fL (ref 78.0–100.0)
Platelets: 325 10*3/uL (ref 150–400)
RBC: 3.77 MIL/uL — AB (ref 3.87–5.11)
RDW: 12.8 % (ref 11.5–15.5)
WBC: 5.1 10*3/uL (ref 4.0–10.5)

## 2016-10-05 LAB — BASIC METABOLIC PANEL
ANION GAP: 6 (ref 5–15)
BUN: 10 mg/dL (ref 6–20)
CALCIUM: 9.2 mg/dL (ref 8.9–10.3)
CO2: 23 mmol/L (ref 22–32)
Chloride: 109 mmol/L (ref 101–111)
Creatinine, Ser: 0.87 mg/dL (ref 0.44–1.00)
GFR calc non Af Amer: >60 mL/min (ref >60)
GLUCOSE: 101 mg/dL — AB (ref 65–99)
POTASSIUM: 3.9 mmol/L (ref 3.5–5.1)
Sodium: 138 mmol/L (ref 135–145)

## 2016-10-05 LAB — I-STAT TROPONIN, ED: TROPONIN I, POC: 0 ng/mL (ref 0.00–0.08)

## 2016-10-05 MED ORDER — BENZONATATE 100 MG PO CAPS: 100.0000 mg | ORAL_CAPSULE | Freq: Three times a day (TID) | ORAL | 0 refills | Status: DC | PRN

## 2016-10-05 MED ORDER — NAPROXEN 500 MG PO TABS: 500.0000 mg | ORAL_TABLET | Freq: Two times a day (BID) | ORAL | 0 refills | Status: DC | PRN

## 2016-10-05 NOTE — ED Triage Notes (Signed)
Pt complains of left sided chest/back pain and cough for the past week. Pain is worse when sitting up straight.

## 2016-10-05 NOTE — Discharge Instructions (Signed)
Flonase and/or mucinex as needed for nasal congestion, tessalon as needed for cough. Naproxen as needed for pain. Rest, drink plenty of fluids to be sure you are staying hydrated. Please follow up with your primary doctor for discussion of your diagnoses and further evaluation after today's visit if symptoms persist longer than 7 days; Return to the ER for high fevers, difficulty breathing or other concerning symptoms

## 2016-10-05 NOTE — ED Provider Notes (Signed)
[facility] DEPT Provider Note   CSN: [id number] Arrival date & time: 10/05/16  1413     History   Chief Complaint Chief Complaint  Patient presents with  . Chest Pain  . Cough    HPI Carrie Guerra is a 31 y.o. female.  The history is provided by the patient and medical records. No language interpreter was used.  Chest Pain   Associated symptoms include cough. Pertinent negatives include no fever and no shortness of breath.  Cough  Associated symptoms include chest pain. Pertinent negatives include no shortness of breath and no wheezing.    Carrie Guerra is a 31 y.o. female who presents to the Emergency Department complaining of constant left-sided chest pain x 1 week. Pain is worse with coughing, laughing and deep breaths. Associated symptoms include productive cough of green sputum and nasal congestion. No medications taken prior to arrival for symptoms. No sick contacts. + smoker.    Past Medical History:  Diagnosis Date  . Fibroids     There are no active problems to display for this patient.   History reviewed. No pertinent surgical history.  OB History    No data available       Home Medications    Prior to Admission medications   Medication Sig Start Date End Date Taking? Authorizing Provider  aspirin-acetaminophen-caffeine (EXCEDRIN MIGRAINE) [phone] MG tablet Take 4-5 tablets by mouth every 6 (six) hours as needed (period pain).   Yes Historical Provider, MD  Pseudoeph-Doxylamine-DM-APAP (NYQUIL PO) Take 30 mLs by mouth at bedtime as needed (sleep).   Yes Historical Provider, MD  benzonatate (TESSALON) 100 MG capsule Take 1 capsule (100 mg total) by mouth 3 (three) times daily as needed for cough. 10/05/16   Ozella Almond Thong Feeny, PA-C  diphenhydrAMINE (BENADRYL) 25 mg capsule Take 1 capsule (25 mg total) by mouth every 6 (six) hours as needed. Take every 6 hours for the first 48 hours then use as needed for itching Patient not taking: Reported on  10/05/2016 04/30/16   Gareth Morgan, MD  naproxen (NAPROSYN) 500 MG tablet Take 1 tablet (500 mg total) by mouth 2 (two) times daily as needed. 10/05/16   Ozella Almond Anniya Whiters, PA-C  ranitidine (ZANTAC) 150 MG capsule Take 1 capsule (150 mg total) by mouth daily. Patient not taking: Reported on 10/05/2016 04/30/16 05/03/16  Gareth Morgan, MD    Family History Family History  Problem Relation Age of Onset  . Diabetes Mother   . Diabetes Other     Social History Social History  Substance Use Topics  . Smoking status: Current Every Day Smoker    Types: Cigarettes  . Smokeless tobacco: Never Used  . Alcohol use Yes     Comment: socially     Allergies   Shrimp [shellfish allergy] and Cabbage   Review of Systems Review of Systems  Constitutional: Negative for fever.  Respiratory: Positive for cough. Negative for shortness of breath and wheezing.   Cardiovascular: Positive for chest pain.  All other systems reviewed and are negative.    Physical Exam Updated Vital Signs BP 136/90 (BP Location: Left Arm)   Pulse 84   Temp 98.3 F (36.8 C) (Oral)   Resp 18   LMP 09/14/2016   SpO2 94%   Physical Exam  Constitutional: She is oriented to person, place, and time. She appears well-developed and well-nourished. No distress.  HENT:  Head: Normocephalic and atraumatic.  OP with erythema, no exudates or tonsillar hypertrophy. + nasal congestion with  mucosal edema.   Neck: Normal range of motion. Neck supple.  No meningeal signs.   Cardiovascular: Normal rate, regular rhythm and normal heart sounds.   Pulmonary/Chest: Effort normal. She exhibits tenderness.    Lungs are clear to auscultation bilaterally - no w/r/r  Abdominal: Soft. She exhibits no distension. There is no tenderness.  Musculoskeletal: Normal range of motion.  Neurological: She is alert and oriented to person, place, and time.  Skin: Skin is warm and dry. She is not diaphoretic.  Nursing note and vitals  reviewed.    ED Treatments / Results  Labs (all labs ordered are listed, but only abnormal results are displayed) Labs Reviewed  BASIC METABOLIC PANEL - Abnormal; Notable for the following:       Result Value   Glucose, Bld 101 (*)    All other components within normal limits  CBC - Abnormal; Notable for the following:    RBC 3.77 (*)    Hemoglobin 10.9 (*)    HCT 33.2 (*)    All other components within normal limits  I-STAT TROPOININ, ED    EKG  EKG Interpretation  Date/Time:  Wednesday October 05 2016 14:20:40 EDT Ventricular Rate:  84 PR Interval:    QRS Duration: 75 QT Interval:  368 QTC Calculation: 435 R Axis:   78 Text Interpretation:  Sinus rhythm Right atrial enlargement No STEMI No old tracing to compare Confirmed by [facility] MD, [place] [phone]) on 10/05/2016 6:08:51 PM       Radiology Dg Chest 2 View  Result Date: 10/05/2016 CLINICAL DATA:  Left-sided chest pain, back pain and cough over the last week. EXAM: CHEST  2 VIEW COMPARISON:  10/13/2015 FINDINGS: Heart size is normal. Mediastinal shadows are normal. The lungs are clear. No bronchial thickening. No infiltrate, mass, effusion or collapse. Pulmonary vascularity is normal. No bony abnormality. IMPRESSION: Normal chest Electronically Signed   By: Nelson Chimes M.D.   On: 10/05/2016 14:58    Procedures Procedures (including critical care time)  Medications Ordered in ED Medications - No data to display   Initial Impression / Assessment and Plan / ED Course  I have reviewed the triage vital signs and the nursing notes.  Pertinent labs & imaging results that were available during my care of the patient were reviewed by me and considered in my medical decision making (see chart for details).    Carrie Guerra is afebrile, non-toxic appearing with a clear lung exam. Mild rhinorrhea and OP with erythema, no exudates. Viral URI with cough likely causing chest wall pain - Chest wall is very tender to palpation.  CXR negative, EKG non-ischemic and trop negative. 0 on PERC. Heart score of 1.  Patient is agreeable to symptomatic treatment with close follow up with PCP as needed but spoke at length about emergent, changing, or worsening of symptoms that should prompt return to ER. Patient voices understanding and is agreeable to plan.   Blood pressure 136/90, pulse 84, temperature 98.3 F (36.8 C), temperature source Oral, resp. rate 18, last menstrual period 09/14/2016, SpO2 94 %.   Final Clinical Impressions(s) / ED Diagnoses   Final diagnoses:  Viral URI with cough  Costochondritis    New Prescriptions Discharge Medication List as of 10/05/2016  6:09 PM    START taking these medications   Details  benzonatate (TESSALON) 100 MG capsule Take 1 capsule (100 mg total) by mouth 3 (three) times daily as needed for cough., Starting Wed 10/05/2016, Print    naproxen (  NAPROSYN) 500 MG tablet Take 1 tablet (500 mg total) by mouth 2 (two) times daily as needed., Starting Wed 10/05/2016, Print           [facility] Vee Bahe, PA-C 10/05/16 2110    Fatima Blank, MD 10/06/16 [phone]

## 2016-10-09 ENCOUNTER — Emergency Department (HOSPITAL_BASED_OUTPATIENT_CLINIC_OR_DEPARTMENT_OTHER): Payer: Self-pay

## 2016-10-09 ENCOUNTER — Emergency Department (HOSPITAL_BASED_OUTPATIENT_CLINIC_OR_DEPARTMENT_OTHER)
Admission: EM | Admit: 2016-10-09 | Discharge: 2016-10-09 | Disposition: A | Discharge status: Home / Self Care | Payer: Self-pay | Attending: Emergency Medicine | Admitting: Emergency Medicine

## 2016-10-09 ENCOUNTER — Encounter (HOSPITAL_BASED_OUTPATIENT_CLINIC_OR_DEPARTMENT_OTHER): Payer: Self-pay | Admitting: Emergency Medicine

## 2016-10-09 DIAGNOSIS — Z79899 Other long term (current) drug therapy: Secondary | ICD-10-CM | POA: Insufficient documentation

## 2016-10-09 DIAGNOSIS — R197 Diarrhea, unspecified: Secondary | ICD-10-CM | POA: Insufficient documentation

## 2016-10-09 DIAGNOSIS — R109 Unspecified abdominal pain: Secondary | ICD-10-CM

## 2016-10-09 DIAGNOSIS — D259 Leiomyoma of uterus, unspecified: Secondary | ICD-10-CM | POA: Insufficient documentation

## 2016-10-09 DIAGNOSIS — R112 Nausea with vomiting, unspecified: Secondary | ICD-10-CM | POA: Insufficient documentation

## 2016-10-09 DIAGNOSIS — F17210 Nicotine dependence, cigarettes, uncomplicated: Secondary | ICD-10-CM | POA: Insufficient documentation

## 2016-10-09 LAB — URINALYSIS, ROUTINE W REFLEX MICROSCOPIC
Bilirubin Urine: NEGATIVE
Glucose, UA: NEGATIVE mg/dL
Hgb urine dipstick: NEGATIVE
Ketones, ur: 40 mg/dL — AB
Leukocytes, UA: NEGATIVE
Nitrite: NEGATIVE
Protein, ur: 100 mg/dL — AB
Specific Gravity, Urine: 1.028 (ref 1.005–1.030)
pH: 8.5 — ABNORMAL HIGH (ref 5.0–8.0)

## 2016-10-09 LAB — COMPREHENSIVE METABOLIC PANEL
ALT: 23 U/L (ref 14–54)
AST: 39 U/L (ref 15–41)
Albumin: 4.5 g/dL (ref 3.5–5.0)
Alkaline Phosphatase: 46 U/L (ref 38–126)
Anion gap: 11 (ref 5–15)
BUN: 8 mg/dL (ref 6–20)
CO2: 20 mmol/L — ABNORMAL LOW (ref 22–32)
Calcium: 9.4 mg/dL (ref 8.9–10.3)
Chloride: 109 mmol/L (ref 101–111)
Creatinine, Ser: 0.75 mg/dL (ref 0.44–1.00)
GFR calc Af Amer: >60 mL/min (ref >60)
GFR calc non Af Amer: >60 mL/min (ref >60)
Glucose, Bld: 124 mg/dL — ABNORMAL HIGH (ref 65–99)
Potassium: 3.6 mmol/L (ref 3.5–5.1)
Sodium: 140 mmol/L (ref 135–145)
Total Bilirubin: 0.5 mg/dL (ref 0.3–1.2)
Total Protein: 8.5 g/dL — ABNORMAL HIGH (ref 6.5–8.1)

## 2016-10-09 LAB — URINALYSIS, MICROSCOPIC (REFLEX): RBC / HPF: NONE SEEN RBC/hpf (ref 0–5)

## 2016-10-09 LAB — CBC WITH DIFFERENTIAL/PLATELET
Basophils Absolute: 0 10*3/uL (ref 0.0–0.1)
Basophils Relative: 0 %
Eosinophils Absolute: 0 10*3/uL (ref 0.0–0.7)
Eosinophils Relative: 0 %
HCT: 34.4 % — ABNORMAL LOW (ref 36.0–46.0)
Hemoglobin: 11.7 g/dL — ABNORMAL LOW (ref 12.0–15.0)
Lymphocytes Relative: 8 %
Lymphs Abs: 0.9 10*3/uL (ref 0.7–4.0)
MCH: 29.4 pg (ref 26.0–34.0)
MCHC: 34 g/dL (ref 30.0–36.0)
MCV: 86.4 fL (ref 78.0–100.0)
Monocytes Absolute: 0.6 10*3/uL (ref 0.1–1.0)
Monocytes Relative: 6 %
Neutro Abs: 9.4 10*3/uL — ABNORMAL HIGH (ref 1.7–7.7)
Neutrophils Relative %: 86 %
Platelets: 371 10*3/uL (ref 150–400)
RBC: 3.98 MIL/uL (ref 3.87–5.11)
RDW: 12.5 % (ref 11.5–15.5)
WBC: 10.9 10*3/uL — ABNORMAL HIGH (ref 4.0–10.5)

## 2016-10-09 LAB — LIPASE, BLOOD: Lipase: 18 U/L (ref 11–51)

## 2016-10-09 LAB — PREGNANCY, URINE: Preg Test, Ur: NEGATIVE

## 2016-10-09 MED ORDER — MORPHINE SULFATE (PF) 4 MG/ML IV SOLN
6.0000 mg | Freq: Once | INTRAVENOUS | Status: AC
  Administered 2016-10-09: 6 mg via INTRAVENOUS
  Filled 2016-10-09: qty 2

## 2016-10-09 MED ORDER — KETOROLAC TROMETHAMINE 15 MG/ML IJ SOLN
15.0000 mg | Freq: Once | INTRAMUSCULAR | Status: AC
  Administered 2016-10-09: 15 mg via INTRAVENOUS
  Filled 2016-10-09: qty 1

## 2016-10-09 MED ORDER — OXYCODONE-ACETAMINOPHEN 5-325 MG PO TABS: 1.0000 | ORAL_TABLET | ORAL | 0 refills | Status: DC | PRN

## 2016-10-09 MED ORDER — PROMETHAZINE HCL 25 MG/ML IJ SOLN
12.5000 mg | Freq: Once | INTRAMUSCULAR | Status: AC
  Administered 2016-10-09: 12.5 mg via INTRAVENOUS
  Filled 2016-10-09: qty 1

## 2016-10-09 MED ORDER — IOPAMIDOL (ISOVUE-300) INJECTION 61%
100.0000 mL | Freq: Once | INTRAVENOUS | Status: AC | PRN
  Administered 2016-10-09: 100 mL via INTRAVENOUS

## 2016-10-09 MED ORDER — HYDROMORPHONE HCL 1 MG/ML IJ SOLN
1.0000 mg | Freq: Once | INTRAMUSCULAR | Status: AC
  Administered 2016-10-09: 1 mg via INTRAVENOUS
  Filled 2016-10-09: qty 1

## 2016-10-09 MED ORDER — PANTOPRAZOLE SODIUM 20 MG PO TBEC: 20.0000 mg | DELAYED_RELEASE_TABLET | Freq: Two times a day (BID) | ORAL | 0 refills | Status: DC

## 2016-10-09 MED ORDER — SODIUM CHLORIDE 0.9 % IV BOLUS (SEPSIS)
1000.0000 mL | Freq: Once | INTRAVENOUS | Status: AC
  Administered 2016-10-09: 1000 mL via INTRAVENOUS

## 2016-10-09 NOTE — ED Notes (Signed)
Pt resting quietly. Eyes closed. Rise and fall of chest noted. 

## 2016-10-09 NOTE — ED Provider Notes (Signed)
[facility] DEPT [facility] Provider Note   CSN: [id number] Arrival date & time: 10/09/16  1521   By signing my name below, I, Neta Mends, attest that this documentation has been prepared under the direction and in the presence of Virgel Manifold, MD . Electronically Signed: Neta Mends, ED Scribe. 10/09/2016. 3:48 PM.   History   Chief Complaint Chief Complaint  Patient presents with  . Abdominal Pain    The history is provided by the patient. No language interpreter was used.   HPI Comments:  Carrie Guerra is a 31 y.o. female who presents to the Emergency Department complaining of constant abdominal pain since this AM. She states that the pain in primarily in her LUQ and radiates across her abdomen. She describes the pain as "sharp" and reports pain in her central chest. Pt complains of associated vomiting, back pain, diarrhea, chills. She states that she began vomiting prior to having abdominal pain this AM, and has vomited several times today. She is otherwise healthy and does not take any medications regularly. She took 2 Tums and 2 BCs today with no relief. She reports family hx of kidney stones. Denies any chance of pregnancy. Denies urinary symptoms, fever, vaginal bleeding/discharge.   Past Medical History:  Diagnosis Date  . Fibroids     There are no active problems to display for this patient.   History reviewed. No pertinent surgical history.  OB History    No data available       Home Medications    Prior to Admission medications   Medication Sig Start Date End Date Taking? Authorizing Provider  aspirin-acetaminophen-caffeine (EXCEDRIN MIGRAINE) [phone] MG tablet Take 4-5 tablets by mouth every 6 (six) hours as needed (period pain).    Historical Provider, MD  benzonatate (TESSALON) 100 MG capsule Take 1 capsule (100 mg total) by mouth 3 (three) times daily as needed for cough. 10/05/16   Ozella Almond Ward, PA-C  diphenhydrAMINE (BENADRYL) 25 mg  capsule Take 1 capsule (25 mg total) by mouth every 6 (six) hours as needed. Take every 6 hours for the first 48 hours then use as needed for itching Patient not taking: Reported on 10/05/2016 04/30/16   Gareth Morgan, MD  naproxen (NAPROSYN) 500 MG tablet Take 1 tablet (500 mg total) by mouth 2 (two) times daily as needed. 10/05/16   Jaime Pilcher Ward, PA-C  Pseudoeph-Doxylamine-DM-APAP (NYQUIL PO) Take 30 mLs by mouth at bedtime as needed (sleep).    Historical Provider, MD  ranitidine (ZANTAC) 150 MG capsule Take 1 capsule (150 mg total) by mouth daily. Patient not taking: Reported on 10/05/2016 04/30/16 05/03/16  Gareth Morgan, MD    Family History Family History  Problem Relation Age of Onset  . Diabetes Mother   . Diabetes Other     Social History Social History  Substance Use Topics  . Smoking status: Current Every Day Smoker    Types: Cigarettes  . Smokeless tobacco: Never Used  . Alcohol use Yes     Comment: socially     Allergies   Shrimp [shellfish allergy] and Cabbage   Review of Systems Review of Systems  Constitutional: Positive for chills. Negative for fever.  Gastrointestinal: Positive for abdominal pain, diarrhea, nausea and vomiting.  Genitourinary: Negative for dysuria, hematuria, vaginal bleeding and vaginal discharge.  Musculoskeletal: Positive for back pain.  All other systems reviewed and are negative.    Physical Exam Updated Vital Signs BP 133/86 (BP Location: Right Arm)   Pulse 81  Temp 98 F (36.7 C) (Oral)   Resp (!) 28 Comment: crying  LMP 09/14/2016   SpO2 99%   Physical Exam  Constitutional: She is oriented to person, place, and time. She appears well-developed and well-nourished. No distress.  Anxious, crying.  HENT:  Head: Normocephalic and atraumatic.  Eyes: EOM are normal.  Neck: Normal range of motion.  Cardiovascular: Normal rate, regular rhythm and normal heart sounds.   Pulmonary/Chest: Effort normal and breath sounds  normal.  Abdominal: Soft. She exhibits no distension. There is tenderness (diffuse).  Patient pushing my hand away when barely touching her gown.  Musculoskeletal: Normal range of motion.  Neurological: She is alert and oriented to person, place, and time.  Skin: Skin is warm and dry.  Psychiatric: She has a normal mood and affect. Judgment normal.  Nursing note and vitals reviewed.    ED Treatments / Results  DIAGNOSTIC STUDIES:  Oxygen Saturation is 99% on RA, normal by my interpretation.    COORDINATION OF CARE:  3:46 PM Discussed treatment plan with pt at bedside and pt agreed to plan.   Labs (all labs ordered are listed, but only abnormal results are displayed) Labs Reviewed  URINALYSIS, ROUTINE W REFLEX MICROSCOPIC - Abnormal; Notable for the following:       Result Value   APPearance CLOUDY (*)    pH 8.5 (*)    Ketones, ur 40 (*)    Protein, ur 100 (*)    All other components within normal limits  CBC WITH DIFFERENTIAL/PLATELET - Abnormal; Notable for the following:    WBC 10.9 (*)    Hemoglobin 11.7 (*)    HCT 34.4 (*)    Neutro Abs 9.4 (*)    All other components within normal limits  COMPREHENSIVE METABOLIC PANEL - Abnormal; Notable for the following:    CO2 20 (*)    Glucose, Bld 124 (*)    Total Protein 8.5 (*)    All other components within normal limits  URINALYSIS, MICROSCOPIC (REFLEX) - Abnormal; Notable for the following:    Bacteria, UA MANY (*)    Squamous Epithelial / LPF 6-30 (*)    All other components within normal limits  PREGNANCY, URINE  LIPASE, BLOOD    EKG  EKG Interpretation None       Radiology No results found.  Procedures Procedures (including critical care time)  Medications Ordered in ED Medications - No data to display   Initial Impression / Assessment and Plan / ED Course  I have reviewed the triage vital signs and the nursing notes.  Pertinent labs & imaging results that were available during my care of the  patient were reviewed by me and considered in my medical decision making (see chart for details).    Final Clinical Impressions(s) / ED Diagnoses   Final diagnoses:  Abdominal pain, unspecified abdominal location  Uterine leiomyoma, unspecified location    New Prescriptions New Prescriptions   No medications on file   I personally preformed the services scribed in my presence. The recorded information has been reviewed is accurate. Virgel Manifold, MD.     Virgel Manifold, MD 10/20/16 [phone]

## 2016-10-09 NOTE — ED Notes (Signed)
IV attempt x 1 without success.

## 2016-10-09 NOTE — ED Notes (Signed)
ED Provider at bedside. 

## 2016-10-09 NOTE — ED Notes (Signed)
Patient transported to CT 

## 2016-10-09 NOTE — ED Triage Notes (Addendum)
Upper abd pain with vomiting since this morning. Pt vomited in triage. Pt states she was drinking alcohol last night and smokes marijuana daily.

## 2016-10-09 NOTE — ED Notes (Signed)
Pt on automatic VS.  

## 2016-10-12 ENCOUNTER — Inpatient Hospital Stay (HOSPITAL_COMMUNITY)
Admission: AD | Admit: 2016-10-12 | Discharge: 2016-10-12 | Disposition: A | Discharge status: Home / Self Care | Payer: Self-pay | Source: Ambulatory Visit | Attending: Obstetrics and Gynecology | Admitting: Obstetrics and Gynecology

## 2016-10-12 ENCOUNTER — Inpatient Hospital Stay (HOSPITAL_COMMUNITY): Payer: Self-pay

## 2016-10-12 ENCOUNTER — Encounter (HOSPITAL_COMMUNITY): Payer: Self-pay

## 2016-10-12 DIAGNOSIS — R102 Pelvic and perineal pain: Secondary | ICD-10-CM

## 2016-10-12 DIAGNOSIS — Z598 Other problems related to housing and economic circumstances: Secondary | ICD-10-CM

## 2016-10-12 DIAGNOSIS — N83201 Unspecified ovarian cyst, right side: Secondary | ICD-10-CM | POA: Insufficient documentation

## 2016-10-12 DIAGNOSIS — D259 Leiomyoma of uterus, unspecified: Secondary | ICD-10-CM | POA: Insufficient documentation

## 2016-10-12 DIAGNOSIS — N946 Dysmenorrhea, unspecified: Secondary | ICD-10-CM | POA: Insufficient documentation

## 2016-10-12 DIAGNOSIS — R197 Diarrhea, unspecified: Secondary | ICD-10-CM

## 2016-10-12 DIAGNOSIS — Z79899 Other long term (current) drug therapy: Secondary | ICD-10-CM | POA: Insufficient documentation

## 2016-10-12 DIAGNOSIS — Z599 Problem related to housing and economic circumstances, unspecified: Secondary | ICD-10-CM

## 2016-10-12 DIAGNOSIS — R112 Nausea with vomiting, unspecified: Secondary | ICD-10-CM

## 2016-10-12 DIAGNOSIS — F17210 Nicotine dependence, cigarettes, uncomplicated: Secondary | ICD-10-CM | POA: Insufficient documentation

## 2016-10-12 DIAGNOSIS — Z91013 Allergy to seafood: Secondary | ICD-10-CM | POA: Insufficient documentation

## 2016-10-12 LAB — CBC
HCT: 33.6 % — ABNORMAL LOW (ref 36.0–46.0)
Hemoglobin: 11.3 g/dL — ABNORMAL LOW (ref 12.0–15.0)
MCH: 29.2 pg (ref 26.0–34.0)
MCHC: 33.6 g/dL (ref 30.0–36.0)
MCV: 86.8 fL (ref 78.0–100.0)
Platelets: 358 10*3/uL (ref 150–400)
RBC: 3.87 MIL/uL (ref 3.87–5.11)
RDW: 12.8 % (ref 11.5–15.5)
WBC: 8.3 10*3/uL (ref 4.0–10.5)

## 2016-10-12 LAB — URINALYSIS, ROUTINE W REFLEX MICROSCOPIC
BACTERIA UA: NONE SEEN
BILIRUBIN URINE: NEGATIVE
GLUCOSE, UA: NEGATIVE mg/dL
Ketones, ur: NEGATIVE mg/dL
NITRITE: NEGATIVE
PROTEIN: NEGATIVE mg/dL
Specific Gravity, Urine: 1.019 (ref 1.005–1.030)
pH: 6 (ref 5.0–8.0)

## 2016-10-12 LAB — WET PREP, GENITAL
Clue Cells Wet Prep HPF POC: NONE SEEN
SPERM: NONE SEEN
TRICH WET PREP: NONE SEEN
YEAST WET PREP: NONE SEEN

## 2016-10-12 LAB — POCT PREGNANCY, URINE: PREG TEST UR: NEGATIVE

## 2016-10-12 MED ORDER — PROMETHAZINE HCL 25 MG/ML IJ SOLN
25.0000 mg | Freq: Once | INTRAMUSCULAR | Status: AC
  Administered 2016-10-12: 25 mg via INTRAVENOUS
  Filled 2016-10-12: qty 1

## 2016-10-12 MED ORDER — SODIUM CHLORIDE 0.9 % IV BOLUS (SEPSIS)
1000.0000 mL | Freq: Once | INTRAVENOUS | Status: AC
  Administered 2016-10-12: 1000 mL via INTRAVENOUS

## 2016-10-12 MED ORDER — HYDROMORPHONE HCL 1 MG/ML IJ SOLN: 1.0000 mg | Freq: Once | INTRAMUSCULAR | Status: DC

## 2016-10-12 MED ORDER — PROMETHAZINE HCL 25 MG PO TABS: 12.5000 mg | ORAL_TABLET | Freq: Four times a day (QID) | ORAL | 0 refills | Status: DC | PRN

## 2016-10-12 MED ORDER — ONDANSETRON 8 MG PO TBDP
8.0000 mg | ORAL_TABLET | Freq: Once | ORAL | Status: AC
  Administered 2016-10-12: 8 mg via ORAL
  Filled 2016-10-12: qty 1

## 2016-10-12 MED ORDER — KETOROLAC TROMETHAMINE 60 MG/2ML IM SOLN
60.0000 mg | Freq: Once | INTRAMUSCULAR | Status: AC
  Administered 2016-10-12: 60 mg via INTRAMUSCULAR
  Filled 2016-10-12: qty 2

## 2016-10-12 MED ORDER — HYDROMORPHONE HCL 1 MG/ML IJ SOLN
1.0000 mg | Freq: Once | INTRAMUSCULAR | Status: AC
Start: 2016-10-12 — End: 2016-10-12
  Administered 2016-10-12: 1 mg via INTRAVENOUS
  Filled 2016-10-12: qty 1

## 2016-10-12 NOTE — MAU Provider Note (Signed)
Chief Complaint: Fibroids and Pelvic Pain   None     SUBJECTIVE HPI: Carrie Guerra is a 31 y.o. who presents to maternity admissions reporting severe pain from her fibroids.  She has hx of painful periods x several years, becoming more painful over time.  She presented on 10/09/16 to [facility] with abdominal pain and n/v and had CT scan revealing medium/large sized uterine fibroids, larger than on previous imaging.  She was given Dilaudid and Toradol and prescribed Percocet. She reports she has not picked up the Percocet because of cost and is waiting for her friend to help her pay for the medication.  She took ibuprofen 800 mg yesterday, but reports the medication does not work for her so she did not take a dose today.  She reports Tylenol does not work for her either.  Her pain is constant, in her low abdomen both sides but more on right side, and is associated with n/v/d.  She reports the only think that is different from her presentation on 4/15 is that the pain is worse today.  She took Excedrin today but it did not help. The pain today is associated with vaginal bleeding, described as heavier than her usual period, requiring a pad change every 15 minutes today. She denies vaginal itching/burning, urinary symptoms, h/a, dizziness, n/v, or fever/chills.     HPI  Past Medical History:  Diagnosis Date  . Fibroids   . Medical history non-contributory    Past Surgical History:  Procedure Laterality Date  . NO PAST SURGERIES     Social History   Social History  . Marital status: Single    Spouse name: N/A  . Number of children: N/A  . Years of education: N/A   Occupational History  . Not on file.   Social History Main Topics  . Smoking status: Current Every Day Smoker    Types: Cigarettes  . Smokeless tobacco: Never Used  . Alcohol use Yes     Comment: socially  . Drug use: Yes    Types: Marijuana  . Sexual activity: Yes    Birth control/ protection: None   Other Topics Concern   . Not on file   Social History Narrative  . No narrative on file   No current facility-administered medications on file prior to encounter.    Current Outpatient Prescriptions on File Prior to Encounter  Medication Sig Dispense Refill  . benzonatate (TESSALON) 100 MG capsule Take 1 capsule (100 mg total) by mouth 3 (three) times daily as needed for cough. 21 capsule 0  . naproxen (NAPROSYN) 500 MG tablet Take 1 tablet (500 mg total) by mouth 2 (two) times daily as needed. 30 tablet 0  . oxyCODONE-acetaminophen (PERCOCET/ROXICET) 5-325 MG tablet Take 1 tablet by mouth every 4 (four) hours as needed for severe pain. 10 tablet 0  . pantoprazole (PROTONIX) 20 MG tablet Take 1 tablet (20 mg total) by mouth 2 (two) times daily before a meal. 60 tablet 0  . Pseudoeph-Doxylamine-DM-APAP (NYQUIL PO) Take 30 mLs by mouth at bedtime as needed (sleep).    . ranitidine (ZANTAC) 150 MG capsule Take 1 capsule (150 mg total) by mouth daily. (Patient not taking: Reported on 10/05/2016) 30 capsule 0   Allergies  Allergen Reactions  . Shrimp [Shellfish Allergy] Anaphylaxis  . Cabbage Hives    Pt had to be taken to ER for this reaction     ROS:  Review of Systems  Constitutional: Negative for chills, fatigue and fever.  Respiratory: Negative for shortness of breath.   Cardiovascular: Negative for chest pain.  Gastrointestinal: Positive for abdominal pain, nausea and vomiting.  Genitourinary: Positive for pelvic pain and vaginal bleeding. Negative for difficulty urinating, dysuria, flank pain, vaginal discharge and vaginal pain.  Musculoskeletal: Positive for back pain.  Neurological: Negative for dizziness and headaches.  Psychiatric/Behavioral: Negative.      I have reviewed patient's Past Medical Hx, Surgical Hx, Family Hx, Social Hx, medications and allergies.   Physical Exam   Patient Vitals for the past 24 hrs:  BP Temp Pulse Resp SpO2  10/12/16 0746 126/80 - 86 18 -  10/12/16 0417  128/89 97.8 F (36.6 C) 77 20 98 %   Constitutional: Well-developed, well-nourished female in no acute distress.  Cardiovascular: normal rate Respiratory: normal effort GI: Abd soft, non-tender. Pos BS x 4 MS: Extremities nontender, no edema, normal ROM Neurologic: Alert and oriented x 4.  GU: Neg CVAT.  PELVIC EXAM: Cervix pink, visually closed, without lesion, small amount dark red bleeding, vaginal walls and external genitalia normal Bimanual exam: Cervix 0/long/high, firm, anterior, tenderness to palpation of cervix and entire lower abdomen, diffuse tenderness to all areas, difficult to palpate uterus r/t pt discomfort, no masses or enlargement palpable    LAB RESULTS Results for orders placed or performed during the hospital encounter of 10/12/16 (from the past 24 hour(s))  Urinalysis, Routine w reflex microscopic     Status: Abnormal   Collection Time: 10/12/16  4:10 AM  Result Value Ref Range   Color, Urine YELLOW YELLOW   APPearance HAZY (A) CLEAR   Specific Gravity, Urine 1.019 1.005 - 1.030   pH 6.0 5.0 - 8.0   Glucose, UA NEGATIVE NEGATIVE mg/dL   Hgb urine dipstick LARGE (A) NEGATIVE   Bilirubin Urine NEGATIVE NEGATIVE   Ketones, ur NEGATIVE NEGATIVE mg/dL   Protein, ur NEGATIVE NEGATIVE mg/dL   Nitrite NEGATIVE NEGATIVE   Leukocytes, UA TRACE (A) NEGATIVE   RBC / HPF TOO NUMEROUS TO COUNT 0 - 5 RBC/hpf   WBC, UA 6-30 0 - 5 WBC/hpf   Bacteria, UA NONE SEEN NONE SEEN   Squamous Epithelial / LPF 0-5 (A) NONE SEEN   Mucous PRESENT   Pregnancy, urine POC     Status: None   Collection Time: 10/12/16  4:21 AM  Result Value Ref Range   Preg Test, Ur NEGATIVE NEGATIVE  CBC     Status: Abnormal   Collection Time: 10/12/16  4:25 AM  Result Value Ref Range   WBC 8.3 4.0 - 10.5 K/uL   RBC 3.87 3.87 - 5.11 MIL/uL   Hemoglobin 11.3 (L) 12.0 - 15.0 g/dL   HCT 33.6 (L) 36.0 - 46.0 %   MCV 86.8 78.0 - 100.0 fL   MCH 29.2 26.0 - 34.0 pg   MCHC 33.6 30.0 - 36.0 g/dL   RDW  12.8 11.5 - 15.5 %   Platelets 358 150 - 400 K/uL  Wet prep, genital     Status: Abnormal   Collection Time: 10/12/16  4:30 AM  Result Value Ref Range   Yeast Wet Prep HPF POC NONE SEEN NONE SEEN   Trich, Wet Prep NONE SEEN NONE SEEN   Clue Cells Wet Prep HPF POC NONE SEEN NONE SEEN   WBC, Wet Prep HPF POC MODERATE (A) NONE SEEN   Sperm NONE SEEN        IMAGING Dg Chest 2 View  Result Date: 10/05/2016 CLINICAL DATA:  Left-sided chest pain,  back pain and cough over the last week. EXAM: CHEST  2 VIEW COMPARISON:  10/13/2015 FINDINGS: Heart size is normal. Mediastinal shadows are normal. The lungs are clear. No bronchial thickening. No infiltrate, mass, effusion or collapse. Pulmonary vascularity is normal. No bony abnormality. IMPRESSION: Normal chest Electronically Signed   By: Nelson Chimes M.D.   On: 10/05/2016 14:58   US Transvaginal Non-ob  Result Date: 10/12/2016 CLINICAL DATA:  Initial evaluation for acute lower abdominal pain. EXAM: TRANSABDOMINAL AND TRANSVAGINAL ULTRASOUND OF PELVIS DOPPLER ULTRASOUND OF OVARIES TECHNIQUE: Both transabdominal and transvaginal ultrasound examinations of the pelvis were performed. Transabdominal technique was performed for global imaging of the pelvis including uterus, ovaries, adnexal regions, and pelvic cul-de-sac. It was necessary to proceed with endovaginal exam following the transabdominal exam to visualize the uterus and ovaries. Color and duplex Doppler ultrasound was utilized to evaluate blood flow to the ovaries. COMPARISON:  Prior CT from 10/09/2016. FINDINGS: Uterus Measurements: 8.5 x 5.0 x 5.4 cm. 3 uterine fibroids present. These measure 3.7 x 3.7 x 4.3 cm, 2.0 x 2.1 x 1.6 cm, and 4.3 x 3.5 x 4.0 cm. These fibroids are positioned primarily within the uterine body, with a 1 positioned at the lower uterine segment. Endometrium Thickness: 8.8 mm.  No focal abnormality visualized. Right ovary Measurements: 4.1 x 2.7 x 2.5 cm. Complex 1.7 x 1.3 x  1.3 cm cyst, most compatible with a normal corpus luteal cyst. Left ovary Measurements: 2.4 x 1.1 x 1.7 cm. Normal appearance/no adnexal mass. Pulsed Doppler evaluation of both ovaries demonstrates normal low-resistance arterial and venous waveforms. Other findings No abnormal free fluid. IMPRESSION: 1. Fibroid uterus as above. 2. 1.7 cm mildly complex right ovarian cyst, most consistent with a normal corpus luteal cyst. No further follow-up is recommended for this lesion. This recommendation follows the consensus statement: Management of Asymptomatic Ovarian and Other Adnexal Cysts Imaged at [place]: Society of Radiologists in [place]. Radiology 2010; [phone]. 3. No other acute abnormality within the pelvis. No evidence for ovarian torsion. Electronically Signed   By: Jeannine Boga M.D.   On: 10/12/2016 07:07   US Pelvis Complete  Result Date: 10/12/2016 CLINICAL DATA:  Initial evaluation for acute lower abdominal pain. EXAM: TRANSABDOMINAL AND TRANSVAGINAL ULTRASOUND OF PELVIS DOPPLER ULTRASOUND OF OVARIES TECHNIQUE: Both transabdominal and transvaginal ultrasound examinations of the pelvis were performed. Transabdominal technique was performed for global imaging of the pelvis including uterus, ovaries, adnexal regions, and pelvic cul-de-sac. It was necessary to proceed with endovaginal exam following the transabdominal exam to visualize the uterus and ovaries. Color and duplex Doppler ultrasound was utilized to evaluate blood flow to the ovaries. COMPARISON:  Prior CT from 10/09/2016. FINDINGS: Uterus Measurements: 8.5 x 5.0 x 5.4 cm. 3 uterine fibroids present. These measure 3.7 x 3.7 x 4.3 cm, 2.0 x 2.1 x 1.6 cm, and 4.3 x 3.5 x 4.0 cm. These fibroids are positioned primarily within the uterine body, with a 1 positioned at the lower uterine segment. Endometrium Thickness: 8.8 mm.  No focal abnormality visualized. Right ovary Measurements: 4.1 x 2.7 x 2.5 cm. Complex  1.7 x 1.3 x 1.3 cm cyst, most compatible with a normal corpus luteal cyst. Left ovary Measurements: 2.4 x 1.1 x 1.7 cm. Normal appearance/no adnexal mass. Pulsed Doppler evaluation of both ovaries demonstrates normal low-resistance arterial and venous waveforms. Other findings No abnormal free fluid. IMPRESSION: 1. Fibroid uterus as above. 2. 1.7 cm mildly complex right ovarian cyst, most consistent with a normal  corpus luteal cyst. No further follow-up is recommended for this lesion. This recommendation follows the consensus statement: Management of Asymptomatic Ovarian and Other Adnexal Cysts Imaged at [place]: Society of Radiologists in [place]. Radiology 2010; [phone]. 3. No other acute abnormality within the pelvis. No evidence for ovarian torsion. Electronically Signed   By: Jeannine Boga M.D.   On: 10/12/2016 07:07   Ct Abdomen Pelvis W Contrast  Result Date: 10/09/2016 CLINICAL DATA:  Upper abdominal pain, vomiting starting this morning EXAM: CT ABDOMEN AND PELVIS WITH CONTRAST TECHNIQUE: Multidetector CT imaging of the abdomen and pelvis was performed using the standard protocol following bolus administration of intravenous contrast. CONTRAST:  14mL ISOVUE-300 IOPAMIDOL (ISOVUE-300) INJECTION 61% COMPARISON:  06/17/2014 FINDINGS: Lower chest: Lung bases shows no acute findings. Again noted small hiatal hernia and minimal thickening of distal esophageal wall. Gastroesophageal reflux cannot be excluded. Hepatobiliary: No focal liver abnormality is seen. No gallstones, gallbladder wall thickening, or biliary dilatation. Pancreas: Unremarkable. No pancreatic ductal dilatation or surrounding inflammatory changes. Spleen: Normal in size without focal abnormality. Adrenals/Urinary Tract: No adrenal gland mass. Enhanced kidneys are symmetrical in size. No hydronephrosis or hydroureter. The urinary bladder is unremarkable. No thickening of urinary bladder wall.  Stomach/Bowel: No gastric outlet obstruction. No small bowel obstruction. The terminal ileum is unremarkable. No pericecal inflammation. Normal appendix noted in axial image 61. No distal colonic obstruction. No evidence of acute colitis or diverticulitis. Vascular/Lymphatic: No retroperitoneal or mesenteric adenopathy. Reproductive: Again noted enlarged uterus measures 7.7 x 9.4 cm. Anterior fundal fibroid measures 4.1 cm. A posterior uterine fibroid occupying posterior cul-de-sac measures at least 3.9 cm. There might be a third posterior lower uterine fibroid measures 2 cm. A right ovarian cyst measures 2 cm. The left ovary is slight elongated unremarkable. Other: Trace pelvic free fluid is noted. There is small umbilical hernia containing fat without evidence of acute complication. Musculoskeletal: No destructive bony lesions are noted. IMPRESSION: 1. Small hiatal hernia. Mild thickening of distal esophageal wall. Gastroesophageal reflux cannot be excluded. 2. No small bowel obstruction. No pericecal inflammation. Normal appendix. 3. No evidence of colitis or diverticulitis. 4. Again noted mild enlarged uterus with fibroids as described above. The largest fibroid in anterior fundus measures 4.1 by 4 cm. A posterior fibroid occupying posterior cul-de-sac measures 4 x 3.9 cm. These have progressed in size from prior exam. Correlation with GYN exam and pelvic ultrasound is recommended. Small pelvic free fluid is noted. There is a right ovarian cyst measures 2 cm. Electronically Signed   By: Lahoma Crocker M.D.   On: 10/09/2016 21:36   [place] Art/ven Flow Abd Pelv Doppler  Result Date: 10/12/2016 CLINICAL DATA:  Initial evaluation for acute lower abdominal pain. EXAM: TRANSABDOMINAL AND TRANSVAGINAL ULTRASOUND OF PELVIS DOPPLER ULTRASOUND OF OVARIES TECHNIQUE: Both transabdominal and transvaginal ultrasound examinations of the pelvis were performed. Transabdominal technique was performed for global imaging of the pelvis  including uterus, ovaries, adnexal regions, and pelvic cul-de-sac. It was necessary to proceed with endovaginal exam following the transabdominal exam to visualize the uterus and ovaries. Color and duplex Doppler ultrasound was utilized to evaluate blood flow to the ovaries. COMPARISON:  Prior CT from 10/09/2016. FINDINGS: Uterus Measurements: 8.5 x 5.0 x 5.4 cm. 3 uterine fibroids present. These measure 3.7 x 3.7 x 4.3 cm, 2.0 x 2.1 x 1.6 cm, and 4.3 x 3.5 x 4.0 cm. These fibroids are positioned primarily within the uterine body, with a 1 positioned at the lower uterine segment. Endometrium  Thickness: 8.8 mm.  No focal abnormality visualized. Right ovary Measurements: 4.1 x 2.7 x 2.5 cm. Complex 1.7 x 1.3 x 1.3 cm cyst, most compatible with a normal corpus luteal cyst. Left ovary Measurements: 2.4 x 1.1 x 1.7 cm. Normal appearance/no adnexal mass. Pulsed Doppler evaluation of both ovaries demonstrates normal low-resistance arterial and venous waveforms. Other findings No abnormal free fluid. IMPRESSION: 1. Fibroid uterus as above. 2. 1.7 cm mildly complex right ovarian cyst, most consistent with a normal corpus luteal cyst. No further follow-up is recommended for this lesion. This recommendation follows the consensus statement: Management of Asymptomatic Ovarian and Other Adnexal Cysts Imaged at [place]: Society of Radiologists in [place]. Radiology 2010; [phone]. 3. No other acute abnormality within the pelvis. No evidence for ovarian torsion. Electronically Signed   By: Jeannine Boga M.D.   On: 10/12/2016 07:07    MAU Management/MDM: Ordered CBC, U/A, wet prep, GCC and reviewed results.  Pt with pain during pelvic exam but overall tolerated well.  [place] staff concerned about performing transvaginal US since pt indicates she has never had intercourse.  Discussed with pt, who is concerned about abdominal pain but has had pelvic exams and relationship with female partner so  believes she will be able to tolerate transvaginal US without difficulty.  Toradol 60 mg IM x 1 and Zofran 8 mg ODT given. Pt vomited immediately after Zofran so unsure how much was absorbed.  NS x 1000 ml, Dilaudid 1 mg IV, and Phenergan 25 mg IV given.  Pt able to tolerate TVUS and results c/w CT scan with multiple fibroids and right corpus luteal cyst.  Reviewed findings with pt, plan to follow up in office for chronic pain with surgical consult for hysterectomy.  Pt to pick up Percocet Rx.  Phenergan 12.5-25 mg Q 6 hours PRN.  Add ibuprofen 600 mg PO Q 6 hours.  Message sent to office to establish care.  Message sent to SW to address pt insurance/payment concerns, to assist pt with resources. Pt stable at time of discharge.  ASSESSMENT 1. Nausea vomiting and diarrhea   2. Fibroid uterus   3. Pelvic pain in female   4. Dysmenorrhea   5. Financial difficulties     PLAN Discharge home Allergies as of 10/12/2016      Reactions   Shrimp [shellfish Allergy] Anaphylaxis   Cabbage Hives   Pt had to be taken to ER for this reaction       Medication List    STOP taking these medications   aspirin-acetaminophen-caffeine 250-250-65 MG tablet Commonly known as:  EXCEDRIN MIGRAINE   diphenhydrAMINE 25 mg capsule Commonly known as:  BENADRYL     TAKE these medications   benzonatate 100 MG capsule Commonly known as:  TESSALON Take 1 capsule (100 mg total) by mouth 3 (three) times daily as needed for cough.   naproxen 500 MG tablet Commonly known as:  NAPROSYN Take 1 tablet (500 mg total) by mouth 2 (two) times daily as needed.   NYQUIL PO Take 30 mLs by mouth at bedtime as needed (sleep).   oxyCODONE-acetaminophen 5-325 MG tablet Commonly known as:  PERCOCET/ROXICET Take 1 tablet by mouth every 4 (four) hours as needed for severe pain.   pantoprazole 20 MG tablet Commonly known as:  PROTONIX Take 1 tablet (20 mg total) by mouth 2 (two) times daily before a meal.   promethazine 25  MG tablet Commonly known as:  PHENERGAN Take 0.5-1 tablets (12.5-25 mg  total) by mouth every 6 (six) hours as needed.   ranitidine 150 MG capsule Commonly known as:  ZANTAC Take 1 capsule (150 mg total) by mouth daily.      Follow-up [facility] for [facility]. Schedule an appointment as soon as possible for a visit.   Specialty:  Obstetrics and Gynecology Why:  Reschedule your Gyn appointment as soon as possible to follow up on  your fibroids.  Return to MAU as needed for emergencies. Contact information: Brent A[place]am [place] Certified Nurse-Midwife 10/12/2016  7:59 AM

## 2016-10-12 NOTE — MAU Note (Addendum)
Pt states that she started having lower abdominal pain at 2am, states it is sharp. Was told several days ago that she had fibroids "that got bigger". States she is changing her pad every 15 mins since 2 am. Unable to keep anything down. States she didn't take anything for the pain because tylenol or ibuprofen don't work for her.

## 2016-10-13 LAB — URINE CULTURE

## 2016-10-13 LAB — GC/CHLAMYDIA PROBE AMP (~~LOC~~) NOT AT ARMC
Chlamydia: NEGATIVE
NEISSERIA GONORRHEA: NEGATIVE

## 2016-10-25 ENCOUNTER — Encounter: Payer: Self-pay | Admitting: Obstetrics & Gynecology

## 2016-11-06 ENCOUNTER — Emergency Department (HOSPITAL_BASED_OUTPATIENT_CLINIC_OR_DEPARTMENT_OTHER)
Admission: EM | Admit: 2016-11-06 | Discharge: 2016-11-06 | Disposition: A | Discharge status: Home / Self Care | Payer: Self-pay | Attending: Emergency Medicine | Admitting: Emergency Medicine

## 2016-11-06 ENCOUNTER — Encounter (HOSPITAL_BASED_OUTPATIENT_CLINIC_OR_DEPARTMENT_OTHER): Payer: Self-pay | Admitting: Emergency Medicine

## 2016-11-06 DIAGNOSIS — R1032 Left lower quadrant pain: Secondary | ICD-10-CM | POA: Insufficient documentation

## 2016-11-06 DIAGNOSIS — F17210 Nicotine dependence, cigarettes, uncomplicated: Secondary | ICD-10-CM | POA: Insufficient documentation

## 2016-11-06 DIAGNOSIS — R1010 Upper abdominal pain, unspecified: Secondary | ICD-10-CM | POA: Insufficient documentation

## 2016-11-06 DIAGNOSIS — Z8742 Personal history of other diseases of the female genital tract: Secondary | ICD-10-CM | POA: Insufficient documentation

## 2016-11-06 DIAGNOSIS — Z79899 Other long term (current) drug therapy: Secondary | ICD-10-CM | POA: Insufficient documentation

## 2016-11-06 LAB — COMPREHENSIVE METABOLIC PANEL
ALBUMIN: 4.6 g/dL (ref 3.5–5.0)
ALK PHOS: 51 U/L (ref 38–126)
ALT: 22 U/L (ref 14–54)
ANION GAP: 12 (ref 5–15)
AST: 37 U/L (ref 15–41)
BUN: 8 mg/dL (ref 6–20)
CALCIUM: 9.9 mg/dL (ref 8.9–10.3)
CO2: 21 mmol/L — ABNORMAL LOW (ref 22–32)
Chloride: 107 mmol/L (ref 101–111)
Creatinine, Ser: 0.96 mg/dL (ref 0.44–1.00)
GFR calc Af Amer: >60 mL/min (ref >60)
GLUCOSE: 135 mg/dL — AB (ref 65–99)
Potassium: 3.3 mmol/L — ABNORMAL LOW (ref 3.5–5.1)
Sodium: 140 mmol/L (ref 135–145)
TOTAL PROTEIN: 8.7 g/dL — AB (ref 6.5–8.1)
Total Bilirubin: 0.8 mg/dL (ref 0.3–1.2)

## 2016-11-06 LAB — URINALYSIS, ROUTINE W REFLEX MICROSCOPIC
Bilirubin Urine: NEGATIVE
GLUCOSE, UA: NEGATIVE mg/dL
Hgb urine dipstick: NEGATIVE
Ketones, ur: >80 mg/dL — AB
LEUKOCYTES UA: NEGATIVE
Nitrite: NEGATIVE
PH: 7 (ref 5.0–8.0)
PROTEIN: 30 mg/dL — AB
SPECIFIC GRAVITY, URINE: 1.026 (ref 1.005–1.030)

## 2016-11-06 LAB — URINALYSIS, MICROSCOPIC (REFLEX): WBC UA: NONE SEEN WBC/hpf (ref 0–5)

## 2016-11-06 LAB — CBC WITH DIFFERENTIAL/PLATELET
BASOS PCT: 0 %
Basophils Absolute: 0 10*3/uL (ref 0.0–0.1)
Eosinophils Absolute: 0 10*3/uL (ref 0.0–0.7)
Eosinophils Relative: 0 %
HEMATOCRIT: 36 % (ref 36.0–46.0)
Hemoglobin: 12.2 g/dL (ref 12.0–15.0)
LYMPHS ABS: 1.6 10*3/uL (ref 0.7–4.0)
LYMPHS PCT: 16 %
MCH: 29.1 pg (ref 26.0–34.0)
MCHC: 33.9 g/dL (ref 30.0–36.0)
MCV: 85.9 fL (ref 78.0–100.0)
MONO ABS: 0.8 10*3/uL (ref 0.1–1.0)
MONOS PCT: 8 %
NEUTROS ABS: 7.6 10*3/uL (ref 1.7–7.7)
NEUTROS PCT: 76 %
Platelets: 407 10*3/uL — ABNORMAL HIGH (ref 150–400)
RBC: 4.19 MIL/uL (ref 3.87–5.11)
RDW: 12.4 % (ref 11.5–15.5)
WBC: 9.9 10*3/uL (ref 4.0–10.5)

## 2016-11-06 LAB — LIPASE, BLOOD: Lipase: 17 U/L (ref 11–51)

## 2016-11-06 LAB — PREGNANCY, URINE: Preg Test, Ur: NEGATIVE

## 2016-11-06 MED ORDER — PROMETHAZINE HCL 25 MG/ML IJ SOLN
12.5000 mg | Freq: Once | INTRAMUSCULAR | Status: AC
  Administered 2016-11-06: 12.5 mg via INTRAVENOUS
  Filled 2016-11-06: qty 1

## 2016-11-06 MED ORDER — ONDANSETRON 4 MG PO TBDP: ORAL_TABLET | ORAL | 0 refills | Status: DC

## 2016-11-06 MED ORDER — KETOROLAC TROMETHAMINE 30 MG/ML IJ SOLN
INTRAMUSCULAR | Status: AC
  Filled 2016-11-06: qty 1

## 2016-11-06 MED ORDER — KETOROLAC TROMETHAMINE 30 MG/ML IJ SOLN
30.0000 mg | Freq: Once | INTRAMUSCULAR | Status: AC
  Administered 2016-11-06: 30 mg via INTRAVENOUS

## 2016-11-06 MED ORDER — ONDANSETRON HCL 4 MG/2ML IJ SOLN
INTRAMUSCULAR | Status: AC
  Filled 2016-11-06: qty 2

## 2016-11-06 MED ORDER — ONDANSETRON HCL 4 MG/2ML IJ SOLN
4.0000 mg | Freq: Once | INTRAMUSCULAR | Status: AC
  Administered 2016-11-06: 4 mg via INTRAVENOUS

## 2016-11-06 MED ORDER — SODIUM CHLORIDE 0.9 % IV BOLUS (SEPSIS)
1000.0000 mL | Freq: Once | INTRAVENOUS | Status: AC
  Administered 2016-11-06: 1000 mL via INTRAVENOUS

## 2016-11-06 MED ORDER — FENTANYL CITRATE (PF) 100 MCG/2ML IJ SOLN
50.0000 ug | Freq: Once | INTRAMUSCULAR | Status: AC
  Administered 2016-11-06: 50 ug via INTRAVENOUS
  Filled 2016-11-06: qty 2

## 2016-11-06 NOTE — ED Notes (Signed)
Alert, NAD, calm, interactive, resps e/u, speaking in clear complete sentences, no dyspnea noted, skin W&D, VSS, c/o pain and nausea, (denies: sob, dizziness). Family at [facility].

## 2016-11-06 NOTE — ED Triage Notes (Signed)
LUQ abd pain with vomiting since early this morning.

## 2016-11-06 NOTE — ED Provider Notes (Signed)
[facility] Provider Note   CSN: [id number] Arrival date & time: 11/06/16  1806  By signing my name below, I, Carrie Guerra, attest that this documentation has been prepared under the direction and in the presence of Malvin Johns, MD. Electronically Signed: Reola Guerra, ED Scribe. 11/06/16. 6:31 PM.  History   Chief Complaint Chief Complaint  Patient presents with  . Abdominal Pain   The history is provided by the patient and medical records. No language interpreter was used.    HPI Comments: Carrie Guerra is a 31 y.o. female w/ a h/o uterine fibroids, who presents to the Emergency Department complaining of persistent, chronic left-sided lower and left-middle abdominal pain beginning several months ago, acutely worsening and constant this morning ~12 hours ago. Pt reports associated nausea, hematemesis, diarrhea, and intermittent diaphoresis. Pt has a h/o similar pain and symptoms  associated with fibroids, however, she says her episodes have been getting worse.   She has been taking Percocet, Tums, and Tylenol at home w/o relief of her pain. She recently underwent transvaginal/abdominal US on 04/18 which confirmed the presence of three uterine fibroids. She is currently followed by a specialist for this issue who is opting for medicinal treatment and is possibly considering a hysterectomy.She denies vaginal bleeding, or any other associated symptoms.   Past Medical History:  Diagnosis Date  . Fibroids   . Medical history non-contributory    There are no active problems to display for this patient.  Past Surgical History:  Procedure Laterality Date  . NO PAST SURGERIES     OB History    No data available     Home Medications    Prior to Admission medications   Medication Sig Start Date End Date Taking? Authorizing Provider  benzonatate (TESSALON) 100 MG capsule Take 1 capsule (100 mg total) by mouth 3 (three) times daily as needed for cough.  10/05/16   Ward, Ozella Almond, PA-C  naproxen (NAPROSYN) 500 MG tablet Take 1 tablet (500 mg total) by mouth 2 (two) times daily as needed. 10/05/16   Ward, Ozella Almond, PA-C  ondansetron (ZOFRAN ODT) 4 MG disintegrating tablet 4mg  ODT q4 hours prn nausea/vomit 11/06/16   Malvin Johns, MD  oxyCODONE-acetaminophen (PERCOCET/ROXICET) 5-325 MG tablet Take 1 tablet by mouth every 4 (four) hours as needed for severe pain. 10/09/16   Virgel Manifold, MD  pantoprazole (PROTONIX) 20 MG tablet Take 1 tablet (20 mg total) by mouth 2 (two) times daily before a meal. 10/09/16   Virgel Manifold, MD  promethazine (PHENERGAN) 25 MG tablet Take 0.5-1 tablets (12.5-25 mg total) by mouth every 6 (six) hours as needed. 10/12/16   Leftwich-Kirby, Kathie Dike, CNM  Pseudoeph-Doxylamine-DM-APAP (NYQUIL PO) Take 30 mLs by mouth at bedtime as needed (sleep).    [provider]  ranitidine (ZANTAC) 150 MG capsule Take 1 capsule (150 mg total) by mouth daily. Patient not taking: Reported on 10/05/2016 04/30/16 05/03/16  Gareth Morgan, MD   Family History Family History  Problem Relation Age of Onset  . Diabetes Mother   . Diabetes Other    Social History Social History  Substance Use Topics  . Smoking status: Current Every Day Smoker    Types: Cigarettes  . Smokeless tobacco: Never Used  . Alcohol use Yes     Comment: socially   Allergies   Shrimp [shellfish allergy] and Cabbage  Review of Systems Review of Systems  Constitutional: Positive for diaphoresis. Negative for chills, fatigue and fever.  HENT:  Negative for congestion, rhinorrhea and sneezing.   Eyes: Negative.   Respiratory: Negative for cough, chest tightness and shortness of breath.   Cardiovascular: Negative for chest pain and leg swelling.  Gastrointestinal: Positive for abdominal pain, diarrhea, nausea and vomiting ( +hematemesis). Negative for blood in stool.  Genitourinary: Negative for difficulty urinating, flank pain, frequency,  hematuria and vaginal bleeding.  Musculoskeletal: Negative for arthralgias and back pain.  Skin: Negative for rash.  Neurological: Negative for dizziness, speech difficulty, weakness, numbness and headaches.   Physical Exam Updated Vital Signs BP 113/70   Pulse 85   Temp 98.2 F (36.8 C) (Oral)   Resp 18   LMP 10/11/2016   SpO2 98%   Physical Exam  Constitutional: She is oriented to person, place, and time. She appears well-developed and well-nourished.  HENT:  Head: Normocephalic and atraumatic.  Eyes: Pupils are equal, round, and reactive to light.  Neck: Normal range of motion. Neck supple.  Cardiovascular: Normal rate, regular rhythm and normal heart sounds.   Pulmonary/Chest: Effort normal and breath sounds normal. No respiratory distress. She has no wheezes. She has no rales. She exhibits no tenderness.  Abdominal: Soft. Bowel sounds are normal. There is tenderness. There is no rebound and no guarding.  Tenderness in the left mid and lower abdomen.   Musculoskeletal: Normal range of motion. She exhibits no edema.  Lymphadenopathy:    She has no cervical adenopathy.  Neurological: She is alert and oriented to person, place, and time.  Skin: Skin is warm and dry. No rash noted.  Psychiatric: She has a normal mood and affect.   ED Treatments / Results  DIAGNOSTIC STUDIES: Oxygen Saturation is 99% on RA, normal by my interpretation.   COORDINATION OF CARE: 6:31 PM-Discussed next steps with pt. Pt verbalized understanding and is agreeable with the plan.   Labs (all labs ordered are listed, but only abnormal results are displayed) Labs Reviewed  URINALYSIS, ROUTINE W REFLEX MICROSCOPIC - Abnormal; Notable for the following:       Result Value   APPearance CLOUDY (*)    Ketones, ur >80 (*)    Protein, ur 30 (*)    All other components within normal limits  CBC WITH DIFFERENTIAL/PLATELET - Abnormal; Notable for the following:    Platelets 407 (*)    All other  components within normal limits  COMPREHENSIVE METABOLIC PANEL - Abnormal; Notable for the following:    Potassium 3.3 (*)    CO2 21 (*)    Glucose, Bld 135 (*)    Total Protein 8.7 (*)    All other components within normal limits  URINALYSIS, MICROSCOPIC (REFLEX) - Abnormal; Notable for the following:    Bacteria, UA RARE (*)    Squamous Epithelial / LPF 6-30 (*)    All other components within normal limits  PREGNANCY, URINE  LIPASE, BLOOD   EKG  EKG Interpretation None      Radiology No results found.  Procedures Procedures   Medications Ordered in ED Medications  ondansetron (ZOFRAN) injection 4 mg (4 mg Intravenous Given 11/06/16 1828)  sodium chloride 0.9 % bolus 1,000 mL (0 mLs Intravenous Stopped 11/06/16 1955)  ketorolac (TORADOL) 30 MG/ML injection 30 mg (30 mg Intravenous Given 11/06/16 1835)  promethazine (PHENERGAN) injection 12.5 mg (12.5 mg Intravenous Given 11/06/16 2024)  fentaNYL (SUBLIMAZE) injection 50 mcg (50 mcg Intravenous Given 11/06/16 2024)  sodium chloride 0.9 % bolus 1,000 mL (0 mLs Intravenous Stopped 11/06/16 2102)   Initial Impression /  Assessment and Plan / ED Course  I have reviewed the triage vital signs and the nursing notes.  Pertinent labs & imaging results that were available during my care of the patient were reviewed by me and considered in my medical decision making (see chart for details).     Patient presents with abdominal pain is consistent with her prior episodes of pain related to her uterine fibroids. She had a recent pelvic ultrasound on April 18 including Doppler ultrasound that was negative for torsion. Negative for other abnormality other than a uterine fibroid. She also had a CT scan of the abdomen and pelvis on April 15 which was negative for acute abnormality other than the uterine fibroid. At this point I don't feel that further imaging is indicated. She was given pain medication and antiemetics in the ED and is feeling  better. She's able to tolerate oral fluids. She's had no episodes of hematemesis in the ED. She was discharged home in good condition. She was going to call and follow-up with her OB/GYN tomorrow. Return precautions were given.  Final Clinical Impressions(s) / ED Diagnoses   Final diagnoses:  Left lower quadrant pain   New Prescriptions Discharge Medication List as of 11/06/2016  8:55 PM    START taking these medications   Details  ondansetron (ZOFRAN ODT) 4 MG disintegrating tablet 4mg  ODT q4 hours prn nausea/vomit, Print       I personally performed the services described in this documentation, which was scribed in my presence.  The recorded information has been reviewed and considered.     Malvin Johns, MD 11/06/16 2120

## 2016-11-07 ENCOUNTER — Encounter (HOSPITAL_BASED_OUTPATIENT_CLINIC_OR_DEPARTMENT_OTHER): Payer: Self-pay | Admitting: *Deleted

## 2016-11-07 ENCOUNTER — Emergency Department (HOSPITAL_BASED_OUTPATIENT_CLINIC_OR_DEPARTMENT_OTHER)
Admission: EM | Admit: 2016-11-07 | Discharge: 2016-11-07 | Disposition: A | Discharge status: Home / Self Care | Payer: Self-pay | Attending: Emergency Medicine | Admitting: Emergency Medicine

## 2016-11-07 DIAGNOSIS — Z79899 Other long term (current) drug therapy: Secondary | ICD-10-CM | POA: Insufficient documentation

## 2016-11-07 DIAGNOSIS — Z86018 Personal history of other benign neoplasm: Secondary | ICD-10-CM

## 2016-11-07 DIAGNOSIS — R1032 Left lower quadrant pain: Secondary | ICD-10-CM | POA: Insufficient documentation

## 2016-11-07 DIAGNOSIS — F17210 Nicotine dependence, cigarettes, uncomplicated: Secondary | ICD-10-CM | POA: Insufficient documentation

## 2016-11-07 LAB — CBC WITH DIFFERENTIAL/PLATELET
BASOS ABS: 0 10*3/uL (ref 0.0–0.1)
Basophils Relative: 0 %
EOS ABS: 0 10*3/uL (ref 0.0–0.7)
EOS PCT: 0 %
HCT: 32.4 % — ABNORMAL LOW (ref 36.0–46.0)
Hemoglobin: 10.9 g/dL — ABNORMAL LOW (ref 12.0–15.0)
LYMPHS ABS: 1.6 10*3/uL (ref 0.7–4.0)
Lymphocytes Relative: 18 %
MCH: 29.1 pg (ref 26.0–34.0)
MCHC: 33.6 g/dL (ref 30.0–36.0)
MCV: 86.4 fL (ref 78.0–100.0)
Monocytes Absolute: 0.7 10*3/uL (ref 0.1–1.0)
Monocytes Relative: 8 %
Neutro Abs: 6.6 10*3/uL (ref 1.7–7.7)
Neutrophils Relative %: 74 %
PLATELETS: 341 10*3/uL (ref 150–400)
RBC: 3.75 MIL/uL — ABNORMAL LOW (ref 3.87–5.11)
RDW: 12.4 % (ref 11.5–15.5)
WBC: 8.9 10*3/uL (ref 4.0–10.5)

## 2016-11-07 LAB — BASIC METABOLIC PANEL
Anion gap: 10 (ref 5–15)
BUN: 6 mg/dL (ref 6–20)
CALCIUM: 8.4 mg/dL — AB (ref 8.9–10.3)
CO2: 19 mmol/L — ABNORMAL LOW (ref 22–32)
CREATININE: 0.8 mg/dL (ref 0.44–1.00)
Chloride: 110 mmol/L (ref 101–111)
GFR calc Af Amer: >60 mL/min (ref >60)
Glucose, Bld: 128 mg/dL — ABNORMAL HIGH (ref 65–99)
Potassium: 3.1 mmol/L — ABNORMAL LOW (ref 3.5–5.1)
Sodium: 139 mmol/L (ref 135–145)

## 2016-11-07 MED ORDER — PROMETHAZINE HCL 25 MG/ML IJ SOLN
12.5000 mg | Freq: Once | INTRAMUSCULAR | Status: AC
  Administered 2016-11-07: 12.5 mg via INTRAVENOUS
  Filled 2016-11-07: qty 1

## 2016-11-07 MED ORDER — MORPHINE SULFATE (PF) 4 MG/ML IV SOLN
4.0000 mg | Freq: Once | INTRAVENOUS | Status: AC
  Administered 2016-11-07: 4 mg via INTRAVENOUS
  Filled 2016-11-07: qty 1

## 2016-11-07 MED ORDER — MORPHINE SULFATE (PF) 4 MG/ML IV SOLN
4.0000 mg | Freq: Once | INTRAVENOUS | Status: AC
  Administered 2016-11-07: 4 mg via INTRAVENOUS

## 2016-11-07 MED ORDER — ONDANSETRON 4 MG PO TBDP
4.0000 mg | ORAL_TABLET | Freq: Once | ORAL | Status: AC | PRN
  Administered 2016-11-07: 4 mg via ORAL
  Filled 2016-11-07: qty 1

## 2016-11-07 MED ORDER — MORPHINE SULFATE (PF) 4 MG/ML IV SOLN
4.0000 mg | Freq: Once | INTRAVENOUS | Status: DC
  Filled 2016-11-07: qty 1

## 2016-11-07 NOTE — Discharge Instructions (Signed)
Continue your Percocet and Zofran as prescribed.  Follow-up with your OB/GYN in the next few days to discuss treatment options.

## 2016-11-07 NOTE — ED Provider Notes (Signed)
[facility] Provider Note   CSN: [id number] Arrival date & time: 11/07/16  0719     History   Chief Complaint Chief Complaint  Patient presents with  . Abdominal Pain    HPI Carrie Guerra is a 31 y.o. female.  Patient is a 31 year old female with history of chronic lower abdominal pain she says is related to uterine fibroids. These were diagnosed approximately 7 years ago. In the past several years she has had frequent episodes of severe lower abdominal pain that causes her to vomit and be very nauseated. She was here yesterday and was given IV fluids, medications, and discharged. She returns here after her pain and nausea have returned. She denies any fevers or chills. She denies any urinary complaints. Pregnancy test yesterday was negative.   The history is provided by the patient.  Abdominal Pain   This is a chronic problem. The problem occurs constantly. The problem has not changed since onset.The pain is located in the LLQ. The quality of the pain is cramping. The pain is severe. Pertinent negatives include fever. The symptoms are aggravated by certain positions and palpation. Nothing relieves the symptoms.    Past Medical History:  Diagnosis Date  . Fibroids   . Medical history non-contributory     There are no active problems to display for this patient.   Past Surgical History:  Procedure Laterality Date  . NO PAST SURGERIES      OB History    No data available       Home Medications    Prior to Admission medications   Medication Sig Start Date End Date Taking? Authorizing Provider  benzonatate (TESSALON) 100 MG capsule Take 1 capsule (100 mg total) by mouth 3 (three) times daily as needed for cough. 10/05/16  Yes Ward, Ozella Almond, PA-C  naproxen (NAPROSYN) 500 MG tablet Take 1 tablet (500 mg total) by mouth 2 (two) times daily as needed. 10/05/16  Yes Ward, Ozella Almond, PA-C  ondansetron (ZOFRAN ODT) 4 MG disintegrating tablet 4mg  ODT q4  hours prn nausea/vomit 11/06/16  Yes Malvin Johns, MD  oxyCODONE-acetaminophen (PERCOCET/ROXICET) 5-325 MG tablet Take 1 tablet by mouth every 4 (four) hours as needed for severe pain. 10/09/16  Yes Virgel Manifold, MD  pantoprazole (PROTONIX) 20 MG tablet Take 1 tablet (20 mg total) by mouth 2 (two) times daily before a meal. 10/09/16  Yes Virgel Manifold, MD  promethazine (PHENERGAN) 25 MG tablet Take 0.5-1 tablets (12.5-25 mg total) by mouth every 6 (six) hours as needed. 10/12/16  Yes Leftwich-Kirby, Kathie Dike, CNM  Pseudoeph-Doxylamine-DM-APAP (NYQUIL PO) Take 30 mLs by mouth at bedtime as needed (sleep).    [provider]  ranitidine (ZANTAC) 150 MG capsule Take 1 capsule (150 mg total) by mouth daily. Patient not taking: Reported on 10/05/2016 04/30/16 05/03/16  Gareth Morgan, MD    Family History Family History  Problem Relation Age of Onset  . Diabetes Mother   . Diabetes Other     Social History Social History  Substance Use Topics  . Smoking status: Current Every Day Smoker    Types: Cigarettes  . Smokeless tobacco: Never Used  . Alcohol use Yes     Comment: socially     Allergies   Shrimp [shellfish allergy] and Cabbage   Review of Systems Review of Systems  Constitutional: Negative for fever.  Gastrointestinal: Positive for abdominal pain.  All other systems reviewed and are negative.    Physical Exam Updated Vital Signs BP  136/89   Pulse 82   Temp 98.1 F (36.7 C) (Oral)   Resp 17   Ht 5' (1.524 m)   Wt 185 lb (83.9 kg)   LMP 10/11/2016   SpO2 100%   BMI 36.13 kg/m   Physical Exam  Constitutional: She is oriented to person, place, and time. She appears well-developed and well-nourished. No distress.  Patient is tearful and hyperventilating. She appears uncomfortable.  HENT:  Head: Normocephalic and atraumatic.  Neck: Normal range of motion. Neck supple.  Cardiovascular: Normal rate and regular rhythm.  Exam reveals no gallop and no friction  rub.   No murmur heard. Pulmonary/Chest: Effort normal and breath sounds normal. No respiratory distress. She has no wheezes.  Abdominal: Soft. Bowel sounds are normal. She exhibits no distension. There is tenderness. There is no rebound and no guarding.  There is tenderness to palpation in the left lower quadrant.  Musculoskeletal: Normal range of motion.  Neurological: She is alert and oriented to person, place, and time.  Skin: Skin is warm and dry. She is not diaphoretic.  Nursing note and vitals reviewed.    ED Treatments / Results  Labs (all labs ordered are listed, but only abnormal results are displayed) Labs Reviewed  BASIC METABOLIC PANEL  CBC WITH DIFFERENTIAL/PLATELET    EKG  EKG Interpretation None       Radiology No results found.  Procedures Procedures (including critical care time)  Medications Ordered in ED Medications  morphine 4 MG/ML injection 4 mg (not administered)  promethazine (PHENERGAN) injection 12.5 mg (not administered)  ondansetron (ZOFRAN-ODT) disintegrating tablet 4 mg (4 mg Oral Given 11/07/16 0752)     Initial Impression / Assessment and Plan / ED Course  I have reviewed the triage vital signs and the nursing notes.  Pertinent labs & imaging results that were available during my care of the patient were reviewed by me and considered in my medical decision making (see chart for details).  Patient with history of chronic abdominal pain related to uterine fibroids. She was just seen here last night with similar complaints. She was given medications, then discharged. She returns today with a recurrence of her discomfort. Her abdominal exam is benign and her laboratory studies are reassuring. She did have a 1 g hemoglobin drop, however I suspect this is related to hydration yesterday. She is having no bloody or dark stools that would suggest an acute GI bleed. She is now feeling better after IV fluids and medications. She has an OB/GYN who is  following her in [place], and I have advised her to call to make prompt follow-up arrangements.  Final Clinical Impressions(s) / ED Diagnoses   Final diagnoses:  None    New Prescriptions New Prescriptions   No medications on file     Veryl Speak, MD 11/07/16 1105

## 2016-11-07 NOTE — ED Notes (Signed)
ED Provider at bedside. 

## 2016-11-07 NOTE — ED Triage Notes (Signed)
Pt reports LLQ pain, n/v since yesterday @0500  -- reports being seen here. States hx of uterine fibroids. Pt currently crying during triage, stating the pain is much worse than yesterday. Denies fever, new symptoms since yesterday.

## 2016-11-08 ENCOUNTER — Emergency Department (HOSPITAL_COMMUNITY): Payer: Self-pay

## 2016-11-08 ENCOUNTER — Encounter (HOSPITAL_COMMUNITY): Payer: Self-pay | Admitting: Radiology

## 2016-11-08 ENCOUNTER — Emergency Department (HOSPITAL_COMMUNITY)
Admission: EM | Admit: 2016-11-08 | Discharge: 2016-11-08 | Disposition: A | Discharge status: Home / Self Care | Payer: Self-pay | Attending: Emergency Medicine | Admitting: Emergency Medicine

## 2016-11-08 DIAGNOSIS — G43A0 Cyclical vomiting, not intractable: Secondary | ICD-10-CM | POA: Insufficient documentation

## 2016-11-08 DIAGNOSIS — D259 Leiomyoma of uterus, unspecified: Secondary | ICD-10-CM | POA: Insufficient documentation

## 2016-11-08 DIAGNOSIS — R109 Unspecified abdominal pain: Secondary | ICD-10-CM

## 2016-11-08 DIAGNOSIS — F12988 Cannabis use, unspecified with other cannabis-induced disorder: Secondary | ICD-10-CM

## 2016-11-08 DIAGNOSIS — R1115 Cyclical vomiting syndrome unrelated to migraine: Secondary | ICD-10-CM

## 2016-11-08 DIAGNOSIS — F12188 Cannabis abuse with other cannabis-induced disorder: Secondary | ICD-10-CM | POA: Insufficient documentation

## 2016-11-08 DIAGNOSIS — F1290 Cannabis use, unspecified, uncomplicated: Secondary | ICD-10-CM

## 2016-11-08 HISTORY — DX: Unspecified asthma, uncomplicated: J45.909

## 2016-11-08 LAB — CBC WITH DIFFERENTIAL/PLATELET
BASOS ABS: 0 10*3/uL (ref 0.0–0.1)
BASOS PCT: 0 %
EOS ABS: 0 10*3/uL (ref 0.0–0.7)
Eosinophils Relative: 0 %
HEMATOCRIT: 34.2 % — AB (ref 36.0–46.0)
HEMOGLOBIN: 11.5 g/dL — AB (ref 12.0–15.0)
Lymphocytes Relative: 12 %
Lymphs Abs: 1.3 10*3/uL (ref 0.7–4.0)
MCH: 28.9 pg (ref 26.0–34.0)
MCHC: 33.6 g/dL (ref 30.0–36.0)
MCV: 85.9 fL (ref 78.0–100.0)
Monocytes Absolute: 0.6 10*3/uL (ref 0.1–1.0)
Monocytes Relative: 6 %
NEUTROS ABS: 8.6 10*3/uL — AB (ref 1.7–7.7)
NEUTROS PCT: 82 %
Platelets: 384 10*3/uL (ref 150–400)
RBC: 3.98 MIL/uL (ref 3.87–5.11)
RDW: 12.5 % (ref 11.5–15.5)
WBC: 10.5 10*3/uL (ref 4.0–10.5)

## 2016-11-08 LAB — COMPREHENSIVE METABOLIC PANEL
ALBUMIN: 4.4 g/dL (ref 3.5–5.0)
ALK PHOS: 44 U/L (ref 38–126)
ALT: 23 U/L (ref 14–54)
ANION GAP: 10 (ref 5–15)
AST: 29 U/L (ref 15–41)
BUN: 6 mg/dL (ref 6–20)
CALCIUM: 9.5 mg/dL (ref 8.9–10.3)
CO2: 21 mmol/L — AB (ref 22–32)
Chloride: 108 mmol/L (ref 101–111)
Creatinine, Ser: 0.9 mg/dL (ref 0.44–1.00)
GFR calc Af Amer: >60 mL/min (ref >60)
GFR calc non Af Amer: >60 mL/min (ref >60)
GLUCOSE: 146 mg/dL — AB (ref 65–99)
Potassium: 3.3 mmol/L — ABNORMAL LOW (ref 3.5–5.1)
SODIUM: 139 mmol/L (ref 135–145)
Total Bilirubin: 0.9 mg/dL (ref 0.3–1.2)
Total Protein: 8.5 g/dL — ABNORMAL HIGH (ref 6.5–8.1)

## 2016-11-08 LAB — URINALYSIS, ROUTINE W REFLEX MICROSCOPIC
Bilirubin Urine: NEGATIVE
Glucose, UA: 50 mg/dL — AB
Hgb urine dipstick: NEGATIVE
KETONES UR: 80 mg/dL — AB
LEUKOCYTES UA: NEGATIVE
NITRITE: NEGATIVE
PROTEIN: NEGATIVE mg/dL
Specific Gravity, Urine: 1.01 (ref 1.005–1.030)
pH: 7 (ref 5.0–8.0)

## 2016-11-08 LAB — POC URINE PREG, ED: PREG TEST UR: NEGATIVE

## 2016-11-08 LAB — LIPASE, BLOOD: Lipase: 15 U/L (ref 11–51)

## 2016-11-08 MED ORDER — IOPAMIDOL (ISOVUE-300) INJECTION 61%
100.0000 mL | Freq: Once | INTRAVENOUS | Status: AC | PRN
  Administered 2016-11-08: 100 mL via INTRAVENOUS

## 2016-11-08 MED ORDER — HYDROMORPHONE HCL 1 MG/ML IJ SOLN
1.0000 mg | Freq: Once | INTRAMUSCULAR | Status: AC
  Administered 2016-11-08: 1 mg via INTRAVENOUS
  Filled 2016-11-08: qty 1

## 2016-11-08 MED ORDER — IOPAMIDOL (ISOVUE-300) INJECTION 61%
INTRAVENOUS | Status: AC
  Filled 2016-11-08: qty 100

## 2016-11-08 MED ORDER — SODIUM CHLORIDE 0.9 % IV BOLUS (SEPSIS)
1000.0000 mL | Freq: Once | INTRAVENOUS | Status: AC
  Administered 2016-11-08: 1000 mL via INTRAVENOUS

## 2016-11-08 MED ORDER — SCOPOLAMINE 1 MG/3DAYS TD PT72
1.0000 | MEDICATED_PATCH | TRANSDERMAL | Status: DC
  Filled 2016-11-08: qty 1

## 2016-11-08 MED ORDER — MORPHINE SULFATE (PF) 4 MG/ML IV SOLN
4.0000 mg | Freq: Once | INTRAVENOUS | Status: AC
  Administered 2016-11-08: 4 mg via INTRAVENOUS
  Filled 2016-11-08: qty 1

## 2016-11-08 MED ORDER — PROCHLORPERAZINE 25 MG RE SUPP: 25.0000 mg | Freq: Two times a day (BID) | RECTAL | 0 refills | Status: DC | PRN

## 2016-11-08 MED ORDER — PROMETHAZINE HCL 25 MG/ML IJ SOLN
25.0000 mg | Freq: Once | INTRAMUSCULAR | Status: AC
  Administered 2016-11-08: 25 mg via INTRAVENOUS
  Filled 2016-11-08: qty 1

## 2016-11-08 MED ORDER — ONDANSETRON HCL 4 MG/2ML IJ SOLN
4.0000 mg | Freq: Once | INTRAMUSCULAR | Status: AC
  Administered 2016-11-08: 4 mg via INTRAVENOUS
  Filled 2016-11-08: qty 2

## 2016-11-08 MED ORDER — DIPHENHYDRAMINE HCL 50 MG/ML IJ SOLN
25.0000 mg | Freq: Once | INTRAMUSCULAR | Status: AC
  Administered 2016-11-08: 25 mg via INTRAVENOUS
  Filled 2016-11-08: qty 1

## 2016-11-08 NOTE — Discharge Instructions (Signed)
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  This is a syndrome seen in people who use marijuana. It is associated with nausea, vomiting, abdominal pain, and generally not feeling well.  Often times patients will report that their symptoms can be relieved with a warm shower.  The best way to prevent this is to completely stop smoking/using all forms of marijuana.  Some people find symptom relief from applying capsaicin cream to their abdomen (belly).  Please be advised that capsaicin cream may cause a burning feeling.  Capsaicin cream may relieve some symptoms, however it will not prevent or fully resolve this condition.   Please keep your appointment with the gynecologist.  Today you received medications that may make you sleepy or impair your ability to make decisions.  For the next 24 hours please do not drive, operate heavy machinery, care for a small child with out another adult present, or perform any activities that may cause harm to you or someone else if you were to fall asleep or be impaired.   You are being prescribed a medication which may make you sleepy. Please follow up of listed precautions for at least 24 hours after taking one dose.

## 2016-11-08 NOTE — ED Notes (Signed)
Pt c/o abdominal and chest pain for 3 days. Also agrees to having SOB and dizziness. Pt states they vomited 8 since 5 am and also has diarrhea.

## 2016-11-08 NOTE — ED Notes (Signed)
Unable to get urine sample from pt, since pt just went.

## 2016-11-08 NOTE — ED Notes (Signed)
Patient is A & O x4.  She understood discharge instructions. 

## 2016-11-08 NOTE — ED Provider Notes (Signed)
[facility] DEPT Provider Note   CSN: [id number] Arrival date & time: 11/08/16  0451     History   Chief Complaint Chief Complaint  Patient presents with  . Abdominal Pain  . Chest Pain    HPI Carrie Guerra is a 31 y.o. female with a history of fibroids who presents today for evaluation of severe abdominal pain.  She has been seen at the ED the past two days in a row with similar complaints.  She reports her pain is LLQ, Has not radiated or moved.  She has not found any alleviating factors, aggravated by palpation, movement.   She is nauseous, has been vomiting.  She expects to start her menstrual cycle "any day now".  Patient states that she is a virgin, denies sexual activity, lives with her girlfriend. Denies abnormal vaginal bleeding, discharge, odors.  Denies urinary pain, burning, or foul odors.  One episode of diarrhea this morning with no blood or mucous present.    Says that this is the same pain that she was having on April 15 when she had a CT scan that showed a mildly enlarged uterus with fibroids present.    HPI  Past Medical History:  Diagnosis Date  . Asthma   . Fibroids   . Medical history non-contributory     There are no active problems to display for this patient.   Past Surgical History:  Procedure Laterality Date  . NO PAST SURGERIES      OB History    No data available       Home Medications    Prior to Admission medications   Medication Sig Start Date End Date Taking? Authorizing Provider  ondansetron (ZOFRAN ODT) 4 MG disintegrating tablet 4mg  ODT q4 hours prn nausea/vomit 11/06/16  Yes Malvin Johns, MD  oxyCODONE-acetaminophen (PERCOCET/ROXICET) 5-325 MG tablet Take 1 tablet by mouth every 4 (four) hours as needed for severe pain. 10/09/16  Yes Virgel Manifold, MD  prochlorperazine (COMPAZINE) 25 MG suppository Place 1 suppository (25 mg total) rectally every 12 (twelve) hours as needed for nausea or vomiting. 11/08/16   Lorin Glass, PA-C  ranitidine (ZANTAC) 150 MG capsule Take 1 capsule (150 mg total) by mouth daily. Patient not taking: Reported on 10/05/2016 04/30/16 05/03/16  Gareth Morgan, MD    Family History Family History  Problem Relation Age of Onset  . Diabetes Mother   . Diabetes Other     Social History Social History  Substance Use Topics  . Smoking status: Current Every Day Smoker    Types: Cigarettes  . Smokeless tobacco: Never Used  . Alcohol use Yes     Comment: socially     Allergies   Shrimp [shellfish allergy] and Cabbage   Review of Systems Review of Systems  Constitutional: Positive for appetite change ("I haven't eaten in three days") and fatigue. Negative for diaphoresis and fever.  HENT: Negative for ear pain and sore throat.   Eyes: Negative for pain and visual disturbance.  Respiratory: Positive for chest tightness. Negative for cough, shortness of breath and wheezing.   Cardiovascular: Positive for chest pain. Negative for palpitations and leg swelling.  Gastrointestinal: Positive for abdominal pain, diarrhea, nausea and vomiting. Negative for abdominal distention, anal bleeding, blood in stool and constipation.  Endocrine: Negative for polydipsia.  Genitourinary: Negative for decreased urine volume, difficulty urinating, dysuria, flank pain, frequency, hematuria, menstrual problem, pelvic pain, vaginal bleeding, vaginal discharge and vaginal pain.  Musculoskeletal: Negative for back pain, joint swelling  and neck pain.  Skin: Negative for color change, rash and wound.  Neurological: Negative for dizziness, weakness, light-headedness and numbness.     Physical Exam Updated Vital Signs BP (!) 130/99 (BP Location: Right Arm)   Pulse 82   Resp (!) 21   LMP 10/11/2016 Comment: neg preg test  SpO2 100%   Physical Exam  Constitutional: She is oriented to person, place, and time. She appears well-developed and well-nourished.  HENT:  Head: Normocephalic and  atraumatic.  Right Ear: External ear normal.  Left Ear: External ear normal.  Eyes: Conjunctivae are normal. No scleral icterus.  Neck: Normal range of motion. No JVD present.  Cardiovascular: Normal rate, regular rhythm and intact distal pulses.   No murmur heard. Pulmonary/Chest: Effort normal and breath sounds normal. No stridor. No respiratory distress. She has no wheezes.  Abdominal: Soft. Normal appearance and bowel sounds are normal. She exhibits no distension. There is tenderness. There is guarding. There is no rebound.    Pain is left middle, at level of umbilicus. Patient is very apprehensive to palpation.   Pain is not pelvic in location.    Musculoskeletal: Normal range of motion. She exhibits no edema or tenderness.  Neurological: She is alert and oriented to person, place, and time. She exhibits normal muscle tone.  Skin: Skin is warm and dry. No rash noted. She is not diaphoretic. No pallor.  Psychiatric: She has a normal mood and affect. Her behavior is normal.  Nursing note and vitals reviewed.    ED Treatments / Results  Labs (all labs ordered are listed, but only abnormal results are displayed) Labs Reviewed  URINALYSIS, ROUTINE W REFLEX MICROSCOPIC - Abnormal; Notable for the following:       Result Value   Color, Urine STRAW (*)    Glucose, UA 50 (*)    Ketones, ur 80 (*)    All other components within normal limits  CBC WITH DIFFERENTIAL/PLATELET - Abnormal; Notable for the following:    Hemoglobin 11.5 (*)    HCT 34.2 (*)    Neutro Abs 8.6 (*)    All other components within normal limits  COMPREHENSIVE METABOLIC PANEL - Abnormal; Notable for the following:    Potassium 3.3 (*)    CO2 21 (*)    Glucose, Bld 146 (*)    Total Protein 8.5 (*)    All other components within normal limits  LIPASE, BLOOD  POC URINE PREG, ED    EKG  EKG Interpretation None       Radiology Dg Chest 2 View  Result Date: 11/08/2016 CLINICAL DATA:  Left mid chest and  abdominal pain for 3 days EXAM: CHEST  2 VIEW COMPARISON:  10/05/2016 FINDINGS: Heart and mediastinal contours are within normal limits. No focal opacities or effusions. No acute bony abnormality. IMPRESSION: No active cardiopulmonary disease. Electronically Signed   By: Rolm Baptise M.D.   On: 11/08/2016 08:49   Ct Abdomen Pelvis W Contrast  Result Date: 11/08/2016 CLINICAL DATA:  Left upper chest pain, left lower quadrant pain. EXAM: CT ABDOMEN AND PELVIS WITH CONTRAST TECHNIQUE: Multidetector CT imaging of the abdomen and pelvis was performed using the standard protocol following bolus administration of intravenous contrast. CONTRAST:  133mL ISOVUE-300 IOPAMIDOL (ISOVUE-300) INJECTION 61% COMPARISON:  CT 10/09/2016 FINDINGS: Lower chest: Circumferential wall thickening in the visualized lower esophagus. Lungs are clear. No effusions. Hepatobiliary: No focal hepatic abnormality. Gallbladder unremarkable. Pancreas: No focal abnormality or ductal dilatation. Spleen: No focal abnormality.  Normal  size. Adrenals/Urinary Tract: No adrenal abnormality. No focal renal abnormality. No stones or hydronephrosis. Urinary bladder is unremarkable. Stomach/Bowel: Appendix is normal. Stomach, large and small bowel grossly unremarkable. Vascular/Lymphatic: No evidence of aneurysm or adenopathy. Reproductive: Enlarged fibroid uterus.  No adnexal masses. Other: Trace free fluid in the pelvis.  No free air. Musculoskeletal: No acute bony abnormality. IMPRESSION: Circumferential wall thickening in the distal esophagus compatible with esophagitis. Fibroid uterus. Normal appendix. No acute findings in the abdomen or pelvis. Electronically Signed   By: Rolm Baptise M.D.   On: 11/08/2016 09:44    Procedures Procedures (including critical care time)  Medications Ordered in ED Medications  iopamidol (ISOVUE-300) 61 % injection (not administered)  sodium chloride 0.9 % bolus 1,000 mL (1,000 mLs Intravenous New Bag/Given 11/08/16  0638)  ondansetron (ZOFRAN) injection 4 mg (4 mg Intravenous Given 11/08/16 0638)  morphine 4 MG/ML injection 4 mg (4 mg Intravenous Given 11/08/16 0709)  ondansetron (ZOFRAN) injection 4 mg (4 mg Intravenous Given 11/08/16 0709)  sodium chloride 0.9 % bolus 1,000 mL (1,000 mLs Intravenous New Bag/Given 11/08/16 0709)  HYDROmorphone (DILAUDID) injection 1 mg (1 mg Intravenous Given 11/08/16 0904)  promethazine (PHENERGAN) injection 25 mg (25 mg Intravenous Given 11/08/16 0905)  diphenhydrAMINE (BENADRYL) injection 25 mg (25 mg Intravenous Given 11/08/16 0913)  iopamidol (ISOVUE-300) 61 % injection 100 mL (100 mLs Intravenous Contrast Given 11/08/16 0932)     Initial Impression / Assessment and Plan / ED Course  I have reviewed the triage vital signs and the nursing notes.  Pertinent labs & imaging results that were available during my care of the patient were reviewed by me and considered in my medical decision making (see chart for details).  Clinical Course as of Nov 08 1033  Tue Nov 08, 2016  1020 Patient reports that she Smokes marijuana approximately 2 times a week.  She reports that the only time she does not have pain is when she is "laying in the hot shower."    [EH]    Clinical Course User Index [EH] Lorin Glass, PA-C   Patient presents for the third time in three days with abdominal pain, nausea and vomiting, symptoms, exam and history consistent with cannabinoid hyperemesis syndrome.  Patient does have a history of uterine fibroids, however her pain location is more superior than I would expect for pain from a uterine/pelvic cause.  Based on repeat visits a CT abdomen was performed with no cause for her pain located.  Based on chest pain in the setting of repeated forceful vomiting a chest x-ray was obtained, no obvious widening of the mediastinum.  Patient was counseled on marijuana use as the most likely cause of her symptoms at this point, and was advised to discontinue  marijuana usage.  She was urged to keep her appointment scheduled this Friday with her gynecologist for evaluation of her fibroids.  Will D/C with compazine suppositories as home ODT Zofran was not controlling her nausea and vomiting.   At this time there does not appear to be any evidence of an acute emergency medical condition and the patient appears stable for discharge with appropriate outpatient follow up.Diagnosis was discussed with patient who verbalizes understanding and is agreeable to discharge. Pt case discussed with Dr. Jeneen Rinks who agrees with my plan.     Final Clinical Impressions(s) / ED Diagnoses   Final diagnoses:  Abdominal pain  Non-intractable cyclical vomiting with nausea  Cannabinoid hyperemesis syndrome (HCC)  Uterine leiomyoma, unspecified location  New Prescriptions New Prescriptions   PROCHLORPERAZINE (COMPAZINE) 25 MG SUPPOSITORY    Place 1 suppository (25 mg total) rectally every 12 (twelve) hours as needed for nausea or vomiting.     Lorin Glass, PA-C 11/08/16 1040    Tanna Furry, MD 11/11/16 [phone]

## 2016-11-09 ENCOUNTER — Encounter (HOSPITAL_COMMUNITY): Payer: Self-pay

## 2016-11-09 ENCOUNTER — Emergency Department (HOSPITAL_COMMUNITY)
Admission: EM | Admit: 2016-11-09 | Discharge: 2016-11-09 | Disposition: A | Discharge status: Home / Self Care | Payer: Self-pay | Attending: Emergency Medicine | Admitting: Emergency Medicine

## 2016-11-09 ENCOUNTER — Inpatient Hospital Stay (HOSPITAL_COMMUNITY)
Admission: AD | Admit: 2016-11-09 | Discharge: 2016-11-09 | Disposition: A | Discharge status: Home / Self Care | Payer: Self-pay | Source: Ambulatory Visit | Attending: Obstetrics and Gynecology | Admitting: Obstetrics and Gynecology

## 2016-11-09 ENCOUNTER — Encounter (HOSPITAL_COMMUNITY): Payer: Self-pay | Admitting: Obstetrics and Gynecology

## 2016-11-09 DIAGNOSIS — R197 Diarrhea, unspecified: Secondary | ICD-10-CM | POA: Insufficient documentation

## 2016-11-09 DIAGNOSIS — R1032 Left lower quadrant pain: Secondary | ICD-10-CM | POA: Insufficient documentation

## 2016-11-09 DIAGNOSIS — J45909 Unspecified asthma, uncomplicated: Secondary | ICD-10-CM | POA: Insufficient documentation

## 2016-11-09 DIAGNOSIS — R1110 Vomiting, unspecified: Secondary | ICD-10-CM | POA: Insufficient documentation

## 2016-11-09 DIAGNOSIS — F12188 Cannabis abuse with other cannabis-induced disorder: Secondary | ICD-10-CM

## 2016-11-09 DIAGNOSIS — Z5321 Procedure and treatment not carried out due to patient leaving prior to being seen by health care provider: Secondary | ICD-10-CM | POA: Insufficient documentation

## 2016-11-09 DIAGNOSIS — R1084 Generalized abdominal pain: Secondary | ICD-10-CM | POA: Insufficient documentation

## 2016-11-09 DIAGNOSIS — F17210 Nicotine dependence, cigarettes, uncomplicated: Secondary | ICD-10-CM | POA: Insufficient documentation

## 2016-11-09 HISTORY — DX: Cannabis abuse with other cannabis-induced disorder: F12.188

## 2016-11-09 LAB — COMPREHENSIVE METABOLIC PANEL
ALT: 17 U/L (ref 14–54)
ANION GAP: 12 (ref 5–15)
AST: 20 U/L (ref 15–41)
Albumin: 4.5 g/dL (ref 3.5–5.0)
Alkaline Phosphatase: 41 U/L (ref 38–126)
BUN: 6 mg/dL (ref 6–20)
CO2: 22 mmol/L (ref 22–32)
Calcium: 9.1 mg/dL (ref 8.9–10.3)
Chloride: 104 mmol/L (ref 101–111)
Creatinine, Ser: 0.75 mg/dL (ref 0.44–1.00)
GFR calc Af Amer: >60 mL/min (ref >60)
Glucose, Bld: 89 mg/dL (ref 65–99)
POTASSIUM: 2.8 mmol/L — AB (ref 3.5–5.1)
Sodium: 138 mmol/L (ref 135–145)
Total Bilirubin: 1.1 mg/dL (ref 0.3–1.2)
Total Protein: 8.5 g/dL — ABNORMAL HIGH (ref 6.5–8.1)

## 2016-11-09 LAB — CBC
HEMATOCRIT: 33 % — AB (ref 36.0–46.0)
HEMOGLOBIN: 11.2 g/dL — AB (ref 12.0–15.0)
MCH: 29.2 pg (ref 26.0–34.0)
MCHC: 33.9 g/dL (ref 30.0–36.0)
MCV: 85.9 fL (ref 78.0–100.0)
Platelets: 373 10*3/uL (ref 150–400)
RBC: 3.84 MIL/uL — ABNORMAL LOW (ref 3.87–5.11)
RDW: 12.7 % (ref 11.5–15.5)
WBC: 8.4 10*3/uL (ref 4.0–10.5)

## 2016-11-09 LAB — I-STAT BETA HCG BLOOD, ED (MC, WL, AP ONLY): I-stat hCG, quantitative: <5 m[IU]/mL (ref <5)

## 2016-11-09 LAB — URINALYSIS, ROUTINE W REFLEX MICROSCOPIC
Bacteria, UA: NONE SEEN
GLUCOSE, UA: NEGATIVE mg/dL
KETONES UR: 80 mg/dL — AB
Nitrite: NEGATIVE
PH: 5 (ref 5.0–8.0)
Protein, ur: 100 mg/dL — AB
SPECIFIC GRAVITY, URINE: 1.032 — AB (ref 1.005–1.030)

## 2016-11-09 LAB — LIPASE, BLOOD: Lipase: 13 U/L (ref 11–51)

## 2016-11-09 MED ORDER — DEXTROSE 5 % AND 0.9 % NACL IV BOLUS
1000.0000 mL | Freq: Once | INTRAVENOUS | Status: AC
  Administered 2016-11-09: 1000 mL via INTRAVENOUS

## 2016-11-09 MED ORDER — PROMETHAZINE HCL 25 MG RE SUPP
25.0000 mg | Freq: Once | RECTAL | Status: AC
  Administered 2016-11-09: 25 mg via RECTAL
  Filled 2016-11-09: qty 1

## 2016-11-09 MED ORDER — ONDANSETRON 8 MG PO TBDP: 8.0000 mg | ORAL_TABLET | Freq: Three times a day (TID) | ORAL | 0 refills | Status: DC | PRN

## 2016-11-09 MED ORDER — ONDANSETRON HCL 4 MG/2ML IJ SOLN: 4.0000 mg | Freq: Once | INTRAMUSCULAR | Status: DC

## 2016-11-09 MED ORDER — DEXTROSE 5 % AND 0.9 % NACL IV BOLUS: 1000.0000 mL | Freq: Once | INTRAVENOUS | Status: DC

## 2016-11-09 MED ORDER — SODIUM CHLORIDE 0.9 % IV SOLN
8.0000 mg | Freq: Once | INTRAVENOUS | Status: AC
  Administered 2016-11-09: 8 mg via INTRAVENOUS
  Filled 2016-11-09: qty 4

## 2016-11-09 MED ORDER — KETOROLAC TROMETHAMINE 60 MG/2ML IM SOLN
60.0000 mg | INTRAMUSCULAR | Status: AC
  Administered 2016-11-09: 60 mg via INTRAMUSCULAR
  Filled 2016-11-09: qty 2

## 2016-11-09 MED ORDER — DEXTROSE IN LACTATED RINGERS 5 % IV SOLN
Freq: Once | INTRAVENOUS | Status: AC
  Administered 2016-11-09: 19:00:00 via INTRAVENOUS
  Filled 2016-11-09: qty 10

## 2016-11-09 NOTE — Discharge Instructions (Signed)
RETURN TO [facility] ROOM, [facility] RO[facility] OR [facility], IF YOUR SYMPTOMS WORSEN OR ARE UNCHANGED.

## 2016-11-09 NOTE — MAU Note (Signed)
Has been to ER for 4 days.Can't take the pain anymore, it's her fibroids, they are growing.. Can't keep her meds down. Hasn't eaten in 4 days. Period started today.  Has also had diarrhea the past 4 days. Stools are watery,comes when she has to vomit.  Period started today.

## 2016-11-09 NOTE — ED Triage Notes (Addendum)
Pt c/o generalized stomach pain starting at 6am. Endorses N/V. Pt crying on the phone during triage. Pt seen here yesterday for same. She states that everything is the same as yesterday except her menstrual cycle has begun today. A&Ox4. Pt given a prescription yesterday. She states that she picked up the medication on the way here, but has not taken any yet.

## 2016-11-09 NOTE — MAU Provider Note (Signed)
History     CSN: [id number]  Arrival date and time: 11/09/16 1454   First Provider Initiated Contact with Patient 11/09/16 1636      Chief Complaint  Patient presents with  . Abdominal Pain  . Emesis  . Diarrhea   HPI  Ms. Carrie Guerra is a 31 yo non-pregnant G15 female presenting with complaints of LLQ pain x 4 days.  She has been seen everyday since 11/07/2016 for the same complaints.  She has fibroids and thinks the pain is from that.  She started her period today; moderate bleeding.  She has not been able to sleep or eat in 4 days.  She was given Zofran, but has run out of the Rx. She tried to take Percocet for the pain, but can't keep it down.  She states that the only thing that relieves her pain is to stay in a hot shower for a long time.  She admits to smoking marijuana on a regular basis; last use was Friday 11/04/2016.    Past Medical History:  Diagnosis Date  . Asthma   . Fibroids   . Medical history non-contributory     Past Surgical History:  Procedure Laterality Date  . NO PAST SURGERIES      Family History  Problem Relation Age of Onset  . Diabetes Mother   . Diabetes Other     Social History  Substance Use Topics  . Smoking status: Current Every Day Smoker    Types: Cigarettes  . Smokeless tobacco: Never Used  . Alcohol use Yes     Comment: socially    Allergies:  Allergies  Allergen Reactions  . Shrimp [Shellfish Allergy] Anaphylaxis  . Cabbage Hives    Pt had to be taken to ER for this reaction     Prescriptions Prior to Admission  Medication Sig Dispense Refill Last Dose  . ondansetron (ZOFRAN ODT) 4 MG disintegrating tablet 4mg  ODT q4 hours prn nausea/vomit 10 tablet 0 11/08/2016 at Unknown time  . oxyCODONE-acetaminophen (PERCOCET/ROXICET) 5-325 MG tablet Take 1 tablet by mouth every 4 (four) hours as needed for severe pain. 10 tablet 0 11/08/2016 at Unknown time  . prochlorperazine (COMPAZINE) 25 MG suppository Place 1 suppository (25 mg  total) rectally every 12 (twelve) hours as needed for nausea or vomiting. 8 suppository 0   . ranitidine (ZANTAC) 150 MG capsule Take 1 capsule (150 mg total) by mouth daily. (Patient not taking: Reported on 10/05/2016) 30 capsule 0 Not Taking at Unknown time    Review of Systems  Constitutional: Positive for appetite change (unable to eat).  HENT: Negative.   Eyes: Negative.   Respiratory: Negative.   Cardiovascular: Negative.   Gastrointestinal: Positive for abdominal pain, diarrhea (8 times/day), nausea and vomiting.  Endocrine: Negative.   Genitourinary: Positive for pelvic pain (LLQ pain) and vaginal bleeding.  Musculoskeletal: Negative.   Skin: Negative.   Allergic/Immunologic: Negative.   Neurological: Negative.   Hematological: Negative.   Psychiatric/Behavioral: Negative.    Physical Exam   Blood pressure (!) 148/91, pulse 87, temperature 97.7 F (36.5 C), temperature source Oral, resp. rate 16, height 5' (1.524 m), weight 185 lb (83.9 kg), last menstrual period 11/09/2016, SpO2 98 %.  Physical Exam  Constitutional: She is oriented to person, place, and time. She appears well-developed and well-nourished.  HENT:  Head: Normocephalic.  Eyes: Pupils are equal, round, and reactive to light.  Neck: Normal range of motion.  Cardiovascular: Normal rate, regular rhythm, normal heart sounds and  intact distal pulses.   Respiratory: Effort normal and breath sounds normal.  GI: Soft. Bowel sounds are normal.  Genitourinary:  Genitourinary Comments: Slight uterine enlargement, LLQ tenderness with palpation  Musculoskeletal: Normal range of motion.  Neurological: She is alert and oriented to person, place, and time. She has normal reflexes.  Skin: Skin is warm and dry.  Psychiatric: She has a normal mood and affect. Her behavior is normal. Judgment and thought content normal.   Results for orders placed or performed during the hospital encounter of 11/09/16 (from the past 24  hour(s))  Urinalysis, Routine w reflex microscopic     Status: Abnormal   Collection Time: 11/09/16  3:30 PM  Result Value Ref Range   Color, Urine YELLOW YELLOW   APPearance HAZY (A) CLEAR   Specific Gravity, Urine 1.032 (H) 1.005 - 1.030   pH 5.0 5.0 - 8.0   Glucose, UA NEGATIVE NEGATIVE mg/dL   Hgb urine dipstick LARGE (A) NEGATIVE   Bilirubin Urine SMALL (A) NEGATIVE   Ketones, ur 80 (A) NEGATIVE mg/dL   Protein, ur 100 (A) NEGATIVE mg/dL   Nitrite NEGATIVE NEGATIVE   Leukocytes, UA SMALL (A) NEGATIVE   RBC / HPF TOO NUMEROUS TO COUNT 0 - 5 RBC/hpf   WBC, UA TOO NUMEROUS TO COUNT 0 - 5 WBC/hpf   Bacteria, UA NONE SEEN NONE SEEN   Squamous Epithelial / LPF 0-5 (A) NONE SEEN   Mucous PRESENT    MAU Course  Procedures  MDM CCUA Bimanual exam -  Assessment and Plan  Cannabis hyperemesis syndrome concurrent with and due to cannabis abuse ([place]) - Educated to stop smoking marijuana, expect sx's to persist for 4-7 days - Drink at least 8 glasses of water daily - Refill Zofran Rx 8mg  ODT every 8 hrs prn n/v  - If symptoms worsen or not improved, seek care at [facility] or [place]  Patient verbalized an understanding of the plan of care and agrees.   Laury Deep MSN, CNM 11/09/2016, 4:36 PM

## 2016-11-10 ENCOUNTER — Emergency Department (HOSPITAL_COMMUNITY)
Admission: EM | Admit: 2016-11-10 | Discharge: 2016-11-10 | Disposition: A | Discharge status: Home / Self Care | Payer: Self-pay | Attending: Emergency Medicine | Admitting: Emergency Medicine

## 2016-11-10 ENCOUNTER — Encounter (HOSPITAL_COMMUNITY): Payer: Self-pay | Admitting: Emergency Medicine

## 2016-11-10 DIAGNOSIS — K2900 Acute gastritis without bleeding: Secondary | ICD-10-CM

## 2016-11-10 DIAGNOSIS — F17210 Nicotine dependence, cigarettes, uncomplicated: Secondary | ICD-10-CM | POA: Insufficient documentation

## 2016-11-10 DIAGNOSIS — R1116 Cannabis hyperemesis syndrome: Secondary | ICD-10-CM

## 2016-11-10 DIAGNOSIS — F12988 Cannabis use, unspecified with other cannabis-induced disorder: Secondary | ICD-10-CM

## 2016-11-10 DIAGNOSIS — F1290 Cannabis use, unspecified, uncomplicated: Secondary | ICD-10-CM

## 2016-11-10 DIAGNOSIS — Z79899 Other long term (current) drug therapy: Secondary | ICD-10-CM | POA: Insufficient documentation

## 2016-11-10 DIAGNOSIS — J45909 Unspecified asthma, uncomplicated: Secondary | ICD-10-CM | POA: Insufficient documentation

## 2016-11-10 DIAGNOSIS — F12188 Cannabis abuse with other cannabis-induced disorder: Secondary | ICD-10-CM | POA: Insufficient documentation

## 2016-11-10 LAB — RAPID URINE DRUG SCREEN, HOSP PERFORMED
AMPHETAMINES: NOT DETECTED
BARBITURATES: NOT DETECTED
Benzodiazepines: NOT DETECTED
Cocaine: NOT DETECTED
Opiates: POSITIVE — AB
Tetrahydrocannabinol: POSITIVE — AB

## 2016-11-10 LAB — PREGNANCY, URINE: PREG TEST UR: NEGATIVE

## 2016-11-10 MED ORDER — SUCRALFATE 1 GM/10ML PO SUSP: 1.0000 g | Freq: Three times a day (TID) | ORAL | 0 refills | Status: DC

## 2016-11-10 MED ORDER — CAPSAICIN 0.025 % EX CREA
TOPICAL_CREAM | Freq: Two times a day (BID) | CUTANEOUS | Status: DC
  Administered 2016-11-10: 1 via TOPICAL
  Filled 2016-11-10: qty 60

## 2016-11-10 MED ORDER — HALOPERIDOL LACTATE 5 MG/ML IJ SOLN
2.0000 mg | Freq: Once | INTRAMUSCULAR | Status: AC
  Administered 2016-11-10: 2 mg via INTRAMUSCULAR
  Filled 2016-11-10: qty 1

## 2016-11-10 MED ORDER — HALOPERIDOL LACTATE 5 MG/ML IJ SOLN
5.0000 mg | Freq: Once | INTRAMUSCULAR | Status: AC
  Administered 2016-11-10: 5 mg via INTRAMUSCULAR
  Filled 2016-11-10: qty 1

## 2016-11-10 MED ORDER — KETOROLAC TROMETHAMINE 60 MG/2ML IM SOLN
30.0000 mg | Freq: Once | INTRAMUSCULAR | Status: AC
  Administered 2016-11-10: 30 mg via INTRAMUSCULAR
  Filled 2016-11-10: qty 2

## 2016-11-10 NOTE — ED Notes (Signed)
Requested urine from patient. 

## 2016-11-10 NOTE — ED Notes (Signed)
Md notified of patient pain

## 2016-11-10 NOTE — ED Triage Notes (Signed)
Pt is c/o abd pain  Pt state she has fibroids and came here earlier today but [facility]  Pt states they gave her something for pain that helped but it has worn off and the pain is back  Pt has active vomiting in triage and is crying

## 2016-11-10 NOTE — ED Notes (Signed)
Patient is not hollering and crying. Patient is not actively vomiting.

## 2016-11-10 NOTE — ED Provider Notes (Addendum)
[facility] DEPT Provider Note   CSN: [id number] Arrival date & time: 11/10/16  5638  By signing my name below, I, Oleh Genin, attest that this documentation has been prepared under the direction and in the presence of [place], Shantana Christon, MD. Electronically Signed: Oleh Genin, Scribe. 11/10/16. 4:03 AM.   History   Chief Complaint Chief Complaint  Patient presents with  . Abdominal Pain    HPI Carrie Guerra is a 31 y.o. female with history of asthma who presents to the ED with vomiting. This patient has been seen multiple times in the last weeks for cyclic vomiting presumed to be cannabis hyperemesis. She was seen several hours ago at [facility], discharged with a Phenergan suppository. She filled this prescription, but has not taken. Decided to present to this facility instead in the setting of persistent vomiting. Also reporting severe abdominal pain.  The history is provided by the patient. No language interpreter was used.  Emesis   This is a chronic problem. The current episode started more than 1 week ago. The problem occurs 5 to 10 times per day. The problem has not changed since onset.The emesis has an appearance of stomach contents. Associated symptoms include abdominal pain. Pertinent negatives include no arthralgias, no chills, no cough, no diarrhea and no fever. Risk factors: marijuana abuse with visit within the past few hours.  Told to stop the marijuana use.    Past Medical History:  Diagnosis Date  . Asthma   . Cannabis hyperemesis syndrome concurrent with and due to cannabis abuse ([place]) 11/09/2016  . Fibroids   . Medical history non-contributory     Patient Active Problem List   Diagnosis Date Noted  . 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 NO PAST SURGERIES      OB History    Gravida Para Term Preterm AB Living   0 0 0 0 0 0   SAB TAB Ectopic  Multiple Live Births   0 0 0 0 0       Home Medications    Prior to Admission medications   Medication Sig Start Date End Date Taking? Authorizing Provider  ondansetron (ZOFRAN ODT) 8 MG disintegrating tablet Take 1 tablet (8 mg total) by mouth every 8 (eight) hours as needed for nausea or vomiting. 11/09/16  Yes Laury Deep, CNM  oxyCODONE-acetaminophen (PERCOCET/ROXICET) 5-325 MG tablet Take 1 tablet by mouth every 4 (four) hours as needed for severe pain. 10/09/16  Yes Virgel Manifold, MD  prochlorperazine (COMPAZINE) 25 MG suppository Place 1 suppository (25 mg total) rectally every 12 (twelve) hours as needed for nausea or vomiting. 11/08/16  Yes Lorin Glass, PA-C    Family History Family History  Problem Relation Age of Onset  . Diabetes Mother   . Diabetes Other     Social History Social History  Substance Use Topics  . Smoking status: Current Every Day Smoker    Types: Cigarettes  . Smokeless tobacco: Never Used  . Alcohol use Yes     Comment: socially     Allergies   Shrimp [shellfish allergy] and Cabbage   Review of Systems Review of Systems  Constitutional: Negative for chills and fever.  HENT: Negative for ear pain and sore throat.   Eyes: Negative for pain and visual disturbance.  Respiratory: Negative for cough and shortness of breath.   Cardiovascular: Negative for chest pain and palpitations.  Gastrointestinal: Positive  for abdominal pain and vomiting. Negative for blood in stool and diarrhea.  Genitourinary: Negative for dysuria and hematuria.  Musculoskeletal: Negative for arthralgias and back pain.  Skin: Negative for color change and rash.  Neurological: Negative for seizures and syncope.  All other systems reviewed and are negative.    Physical Exam Updated Vital Signs BP (!) 152/111 (BP Location: Left Arm)   Pulse 93   Temp 97.9 F (36.6 C) (Oral)   Resp 20   LMP 11/09/2016 (Exact Date)   SpO2 100%   Physical Exam    Constitutional: She appears well-developed and well-nourished. No distress.  HENT:  Head: Normocephalic and atraumatic.  Eyes: Conjunctivae and EOM are normal.  Neck: Normal range of motion. Neck supple.  Cardiovascular: Normal rate, regular rhythm, normal heart sounds and intact distal pulses.   No murmur heard. Pulmonary/Chest: Effort normal and breath sounds normal. No respiratory distress. She has no wheezes. She has no rales.  Abdominal: Soft. Bowel sounds are normal. She exhibits no mass. There is no tenderness. There is no rebound and no guarding.  Musculoskeletal: Normal range of motion. She exhibits no edema.  Neurological: She is alert. She displays normal reflexes.  Skin: Skin is warm and dry. Capillary refill takes less than 2 seconds.  Psychiatric: She has a normal mood and affect.  Nursing note and vitals reviewed.    ED Treatments / Results  Labs (all labs ordered are listed, but only abnormal results are displayed)  Results for orders placed or performed during the hospital encounter of 11/10/16  Rapid urine drug screen (hospital performed)  Result Value Ref Range   Opiates POSITIVE (A) NONE DETECTED   Cocaine NONE DETECTED NONE DETECTED   Benzodiazepines NONE DETECTED NONE DETECTED   Amphetamines NONE DETECTED NONE DETECTED   Tetrahydrocannabinol POSITIVE (A) NONE DETECTED   Barbiturates NONE DETECTED NONE DETECTED  Pregnancy, urine  Result Value Ref Range   Preg Test, Ur NEGATIVE NEGATIVE   Dg Chest 2 View  Result Date: 11/08/2016 CLINICAL DATA:  Left mid chest and abdominal pain for 3 days EXAM: CHEST  2 VIEW COMPARISON:  10/05/2016 FINDINGS: Heart and mediastinal contours are within normal limits. No focal opacities or effusions. No acute bony abnormality. IMPRESSION: No active cardiopulmonary disease. Electronically Signed   By: Rolm Baptise M.D.   On: 11/08/2016 08:49   US Transvaginal Non-ob  Result Date: 10/12/2016 CLINICAL DATA:  Initial evaluation  for acute lower abdominal pain. EXAM: TRANSABDOMINAL AND TRANSVAGINAL ULTRASOUND OF PELVIS DOPPLER ULTRASOUND OF OVARIES TECHNIQUE: Both transabdominal and transvaginal ultrasound examinations of the pelvis were performed. Transabdominal technique was performed for global imaging of the pelvis including uterus, ovaries, adnexal regions, and pelvic cul-de-sac. It was necessary to proceed with endovaginal exam following the transabdominal exam to visualize the uterus and ovaries. Color and duplex Doppler ultrasound was utilized to evaluate blood flow to the ovaries. COMPARISON:  Prior CT from 10/09/2016. FINDINGS: Uterus Measurements: 8.5 x 5.0 x 5.4 cm. 3 uterine fibroids present. These measure 3.7 x 3.7 x 4.3 cm, 2.0 x 2.1 x 1.6 cm, and 4.3 x 3.5 x 4.0 cm. These fibroids are positioned primarily within the uterine body, with a 1 positioned at the lower uterine segment. Endometrium Thickness: 8.8 mm.  No focal abnormality visualized. Right ovary Measurements: 4.1 x 2.7 x 2.5 cm. Complex 1.7 x 1.3 x 1.3 cm cyst, most compatible with a normal corpus luteal cyst. Left ovary Measurements: 2.4 x 1.1 x 1.7 cm. Normal appearance/no adnexal  mass. Pulsed Doppler evaluation of both ovaries demonstrates normal low-resistance arterial and venous waveforms. Other findings No abnormal free fluid. IMPRESSION: 1. Fibroid uterus as above. 2. 1.7 cm mildly complex right ovarian cyst, most consistent with a normal corpus luteal cyst. No further follow-up is recommended for this lesion. This recommendation follows the consensus statement: Management of Asymptomatic Ovarian and Other Adnexal Cysts Imaged at [place]: Society of Radiologists in [place]. Radiology 2010; [phone]. 3. No other acute abnormality within the pelvis. No evidence for ovarian torsion. Electronically Signed   By: Jeannine Boga M.D.   On: 10/12/2016 07:07   US Pelvis Complete  Result Date: 10/12/2016 CLINICAL DATA:  Initial  evaluation for acute lower abdominal pain. EXAM: TRANSABDOMINAL AND TRANSVAGINAL ULTRASOUND OF PELVIS DOPPLER ULTRASOUND OF OVARIES TECHNIQUE: Both transabdominal and transvaginal ultrasound examinations of the pelvis were performed. Transabdominal technique was performed for global imaging of the pelvis including uterus, ovaries, adnexal regions, and pelvic cul-de-sac. It was necessary to proceed with endovaginal exam following the transabdominal exam to visualize the uterus and ovaries. Color and duplex Doppler ultrasound was utilized to evaluate blood flow to the ovaries. COMPARISON:  Prior CT from 10/09/2016. FINDINGS: Uterus Measurements: 8.5 x 5.0 x 5.4 cm. 3 uterine fibroids present. These measure 3.7 x 3.7 x 4.3 cm, 2.0 x 2.1 x 1.6 cm, and 4.3 x 3.5 x 4.0 cm. These fibroids are positioned primarily within the uterine body, with a 1 positioned at the lower uterine segment. Endometrium Thickness: 8.8 mm.  No focal abnormality visualized. Right ovary Measurements: 4.1 x 2.7 x 2.5 cm. Complex 1.7 x 1.3 x 1.3 cm cyst, most compatible with a normal corpus luteal cyst. Left ovary Measurements: 2.4 x 1.1 x 1.7 cm. Normal appearance/no adnexal mass. Pulsed Doppler evaluation of both ovaries demonstrates normal low-resistance arterial and venous waveforms. Other findings No abnormal free fluid. IMPRESSION: 1. Fibroid uterus as above. 2. 1.7 cm mildly complex right ovarian cyst, most consistent with a normal corpus luteal cyst. No further follow-up is recommended for this lesion. This recommendation follows the consensus statement: Management of Asymptomatic Ovarian and Other Adnexal Cysts Imaged at [place]: Society of Radiologists in [place]. Radiology 2010; [phone]. 3. No other acute abnormality within the pelvis. No evidence for ovarian torsion. Electronically Signed   By: Jeannine Boga M.D.   On: 10/12/2016 07:07   Ct Abdomen Pelvis W Contrast  Result Date:  11/08/2016 CLINICAL DATA:  Left upper chest pain, left lower quadrant pain. EXAM: CT ABDOMEN AND PELVIS WITH CONTRAST TECHNIQUE: Multidetector CT imaging of the abdomen and pelvis was performed using the standard protocol following bolus administration of intravenous contrast. CONTRAST:  1108mL ISOVUE-300 IOPAMIDOL (ISOVUE-300) INJECTION 61% COMPARISON:  CT 10/09/2016 FINDINGS: Lower chest: Circumferential wall thickening in the visualized lower esophagus. Lungs are clear. No effusions. Hepatobiliary: No focal hepatic abnormality. Gallbladder unremarkable. Pancreas: No focal abnormality or ductal dilatation. Spleen: No focal abnormality.  Normal size. Adrenals/Urinary Tract: No adrenal abnormality. No focal renal abnormality. No stones or hydronephrosis. Urinary bladder is unremarkable. Stomach/Bowel: Appendix is normal. Stomach, large and small bowel grossly unremarkable. Vascular/Lymphatic: No evidence of aneurysm or adenopathy. Reproductive: Enlarged fibroid uterus.  No adnexal masses. Other: Trace free fluid in the pelvis.  No free air. Musculoskeletal: No acute bony abnormality. IMPRESSION: Circumferential wall thickening in the distal esophagus compatible with esophagitis. Fibroid uterus. Normal appendix. No acute findings in the abdomen or pelvis. Electronically Signed   By: Rolm Baptise M.D.  On: 11/08/2016 09:44   [place] Art/ven Flow Abd Pelv Doppler  Result Date: 10/12/2016 CLINICAL DATA:  Initial evaluation for acute lower abdominal pain. EXAM: TRANSABDOMINAL AND TRANSVAGINAL ULTRASOUND OF PELVIS DOPPLER ULTRASOUND OF OVARIES TECHNIQUE: Both transabdominal and transvaginal ultrasound examinations of the pelvis were performed. Transabdominal technique was performed for global imaging of the pelvis including uterus, ovaries, adnexal regions, and pelvic cul-de-sac. It was necessary to proceed with endovaginal exam following the transabdominal exam to visualize the uterus and ovaries. Color and duplex Doppler  ultrasound was utilized to evaluate blood flow to the ovaries. COMPARISON:  Prior CT from 10/09/2016. FINDINGS: Uterus Measurements: 8.5 x 5.0 x 5.4 cm. 3 uterine fibroids present. These measure 3.7 x 3.7 x 4.3 cm, 2.0 x 2.1 x 1.6 cm, and 4.3 x 3.5 x 4.0 cm. These fibroids are positioned primarily within the uterine body, with a 1 positioned at the lower uterine segment. Endometrium Thickness: 8.8 mm.  No focal abnormality visualized. Right ovary Measurements: 4.1 x 2.7 x 2.5 cm. Complex 1.7 x 1.3 x 1.3 cm cyst, most compatible with a normal corpus luteal cyst. Left ovary Measurements: 2.4 x 1.1 x 1.7 cm. Normal appearance/no adnexal mass. Pulsed Doppler evaluation of both ovaries demonstrates normal low-resistance arterial and venous waveforms. Other findings No abnormal free fluid. IMPRESSION: 1. Fibroid uterus as above. 2. 1.7 cm mildly complex right ovarian cyst, most consistent with a normal corpus luteal cyst. No further follow-up is recommended for this lesion. This recommendation follows the consensus statement: Management of Asymptomatic Ovarian and Other Adnexal Cysts Imaged at [place]: Society of Radiologists in [place]. Radiology 2010; [phone]. 3. No other acute abnormality within the pelvis. No evidence for ovarian torsion. Electronically Signed   By: Jeannine Boga M.D.   On: 10/12/2016 07:07    Radiology Dg Chest 2 View  Result Date: 11/08/2016 CLINICAL DATA:  Left mid chest and abdominal pain for 3 days EXAM: CHEST  2 VIEW COMPARISON:  10/05/2016 FINDINGS: Heart and mediastinal contours are within normal limits. No focal opacities or effusions. No acute bony abnormality. IMPRESSION: No active cardiopulmonary disease. Electronically Signed   By: Rolm Baptise M.D.   On: 11/08/2016 08:49   Ct Abdomen Pelvis W Contrast  Result Date: 11/08/2016 CLINICAL DATA:  Left upper chest pain, left lower quadrant pain. EXAM: CT ABDOMEN AND PELVIS WITH CONTRAST  TECHNIQUE: Multidetector CT imaging of the abdomen and pelvis was performed using the standard protocol following bolus administration of intravenous contrast. CONTRAST:  141mL ISOVUE-300 IOPAMIDOL (ISOVUE-300) INJECTION 61% COMPARISON:  CT 10/09/2016 FINDINGS: Lower chest: Circumferential wall thickening in the visualized lower esophagus. Lungs are clear. No effusions. Hepatobiliary: No focal hepatic abnormality. Gallbladder unremarkable. Pancreas: No focal abnormality or ductal dilatation. Spleen: No focal abnormality.  Normal size. Adrenals/Urinary Tract: No adrenal abnormality. No focal renal abnormality. No stones or hydronephrosis. Urinary bladder is unremarkable. Stomach/Bowel: Appendix is normal. Stomach, large and small bowel grossly unremarkable. Vascular/Lymphatic: No evidence of aneurysm or adenopathy. Reproductive: Enlarged fibroid uterus.  No adnexal masses. Other: Trace free fluid in the pelvis.  No free air. Musculoskeletal: No acute bony abnormality. IMPRESSION: Circumferential wall thickening in the distal esophagus compatible with esophagitis. Fibroid uterus. Normal appendix. No acute findings in the abdomen or pelvis. Electronically Signed   By: Rolm Baptise M.D.   On: 11/08/2016 09:44    Procedures Procedures (including critical care time)  Medications Ordered in ED  Medications  haloperidol lactate (HALDOL) injection 5 mg (5 mg Intramuscular Given  11/10/16 0406)  ketorolac (TORADOL) injection 30 mg (30 mg Intramuscular Given 11/10/16 0406)       Final Clinical Impressions(s) / ED Diagnoses  This is all cannabis cyclic vomiting and chronic pain:  We will not be treating this with narcotics.  She has been given narcotics in the past.  She needs to stop using marijuana.  She has stopped vomiting in the ED.    The patient is nontoxic-appearing on exam and vital signs are within normal limits.  She has had a normal CT scan this week and full blood work.   I have reviewed the  triage vital signs and the nursing notes. Pertinent labs &imaging results that were available during my care of the patient were reviewed by me and considered in my medical decision making (see chart for details). The patient is nontoxic-appearing on exam and vital signs are within normal limits. Return for fevers, vomiting, inability to tolerate liquids or any concerns.    After history, exam, and medical workup I feel the patient has been appropriately medically screened and is safe for discharge home. Pertinent diagnoses were discussed with the patient. Patient was given return precautions.     I personally performed the services described in this documentation, which was scribed in my presence. The recorded information has been reviewed and is accurate.      Tonya Carlile, MD 11/10/16 [place], Zakariah Urwin, MD 11/10/16 4628

## 2016-11-15 ENCOUNTER — Encounter: Payer: Self-pay | Admitting: Obstetrics and Gynecology

## 2016-11-17 ENCOUNTER — Encounter (HOSPITAL_COMMUNITY): Payer: Self-pay | Admitting: *Deleted

## 2016-11-17 ENCOUNTER — Other Ambulatory Visit (HOSPITAL_COMMUNITY): Payer: Self-pay

## 2016-11-17 ENCOUNTER — Emergency Department (HOSPITAL_COMMUNITY): Payer: Self-pay

## 2016-11-17 ENCOUNTER — Observation Stay (HOSPITAL_COMMUNITY)
Admission: EM | Admit: 2016-11-17 | Discharge: 2016-11-19 | Disposition: A | Discharge status: Home / Self Care | Payer: Self-pay | Attending: Internal Medicine | Admitting: Internal Medicine

## 2016-11-17 DIAGNOSIS — Z23 Encounter for immunization: Secondary | ICD-10-CM | POA: Insufficient documentation

## 2016-11-17 DIAGNOSIS — N760 Acute vaginitis: Secondary | ICD-10-CM | POA: Insufficient documentation

## 2016-11-17 DIAGNOSIS — Z91013 Allergy to seafood: Secondary | ICD-10-CM | POA: Insufficient documentation

## 2016-11-17 DIAGNOSIS — E876 Hypokalemia: Secondary | ICD-10-CM

## 2016-11-17 DIAGNOSIS — R102 Pelvic and perineal pain: Secondary | ICD-10-CM

## 2016-11-17 DIAGNOSIS — G43A0 Cyclical vomiting, not intractable: Principal | ICD-10-CM | POA: Insufficient documentation

## 2016-11-17 DIAGNOSIS — Z91018 Allergy to other foods: Secondary | ICD-10-CM | POA: Insufficient documentation

## 2016-11-17 DIAGNOSIS — D259 Leiomyoma of uterus, unspecified: Secondary | ICD-10-CM | POA: Insufficient documentation

## 2016-11-17 DIAGNOSIS — R1110 Vomiting, unspecified: Secondary | ICD-10-CM | POA: Diagnosis present

## 2016-11-17 DIAGNOSIS — F1210 Cannabis abuse, uncomplicated: Secondary | ICD-10-CM | POA: Diagnosis present

## 2016-11-17 DIAGNOSIS — J45909 Unspecified asthma, uncomplicated: Secondary | ICD-10-CM | POA: Insufficient documentation

## 2016-11-17 DIAGNOSIS — F17210 Nicotine dependence, cigarettes, uncomplicated: Secondary | ICD-10-CM | POA: Insufficient documentation

## 2016-11-17 DIAGNOSIS — D649 Anemia, unspecified: Secondary | ICD-10-CM | POA: Insufficient documentation

## 2016-11-17 DIAGNOSIS — K209 Esophagitis, unspecified: Secondary | ICD-10-CM | POA: Insufficient documentation

## 2016-11-17 DIAGNOSIS — F12188 Cannabis abuse with other cannabis-induced disorder: Secondary | ICD-10-CM | POA: Diagnosis present

## 2016-11-17 DIAGNOSIS — Z833 Family history of diabetes mellitus: Secondary | ICD-10-CM | POA: Insufficient documentation

## 2016-11-17 LAB — CBC WITH DIFFERENTIAL/PLATELET
BASOS ABS: 0 10*3/uL (ref 0.0–0.1)
Basophils Relative: 1 %
Eosinophils Absolute: 0 10*3/uL (ref 0.0–0.7)
Eosinophils Relative: 1 %
HEMATOCRIT: 35.2 % — AB (ref 36.0–46.0)
HEMOGLOBIN: 11.6 g/dL — AB (ref 12.0–15.0)
LYMPHS ABS: 1.4 10*3/uL (ref 0.7–4.0)
LYMPHS PCT: 22 %
MCH: 28.3 pg (ref 26.0–34.0)
MCHC: 33 g/dL (ref 30.0–36.0)
MCV: 85.9 fL (ref 78.0–100.0)
Monocytes Absolute: 0.7 10*3/uL (ref 0.1–1.0)
Monocytes Relative: 11 %
NEUTROS ABS: 4.2 10*3/uL (ref 1.7–7.7)
Neutrophils Relative %: 65 %
Platelets: 363 10*3/uL (ref 150–400)
RBC: 4.1 MIL/uL (ref 3.87–5.11)
RDW: 12.4 % (ref 11.5–15.5)
WBC: 6.3 10*3/uL (ref 4.0–10.5)

## 2016-11-17 LAB — COMPREHENSIVE METABOLIC PANEL
ALBUMIN: 4.3 g/dL (ref 3.5–5.0)
ALT: 22 U/L (ref 14–54)
ANION GAP: 12 (ref 5–15)
AST: 27 U/L (ref 15–41)
Alkaline Phosphatase: 41 U/L (ref 38–126)
BILIRUBIN TOTAL: 1.1 mg/dL (ref 0.3–1.2)
BUN: 8 mg/dL (ref 6–20)
CHLORIDE: 98 mmol/L — AB (ref 101–111)
CO2: 26 mmol/L (ref 22–32)
Calcium: 9.2 mg/dL (ref 8.9–10.3)
Creatinine, Ser: 0.97 mg/dL (ref 0.44–1.00)
GFR calc Af Amer: >60 mL/min (ref >60)
Glucose, Bld: 99 mg/dL (ref 65–99)
POTASSIUM: 2.5 mmol/L — AB (ref 3.5–5.1)
Sodium: 136 mmol/L (ref 135–145)
TOTAL PROTEIN: 7.9 g/dL (ref 6.5–8.1)

## 2016-11-17 LAB — BASIC METABOLIC PANEL
Anion gap: 7 (ref 5–15)
BUN: <5 mg/dL — ABNORMAL LOW (ref 6–20)
CO2: 26 mmol/L (ref 22–32)
Calcium: 8.1 mg/dL — ABNORMAL LOW (ref 8.9–10.3)
Chloride: 105 mmol/L (ref 101–111)
Creatinine, Ser: 0.73 mg/dL (ref 0.44–1.00)
GFR calc non Af Amer: >60 mL/min (ref >60)
Glucose, Bld: 87 mg/dL (ref 65–99)
Potassium: 2.7 mmol/L — CL (ref 3.5–5.1)
Sodium: 138 mmol/L (ref 135–145)

## 2016-11-17 LAB — URINALYSIS, ROUTINE W REFLEX MICROSCOPIC
Glucose, UA: NEGATIVE mg/dL
HGB URINE DIPSTICK: NEGATIVE
KETONES UR: 80 mg/dL — AB
LEUKOCYTES UA: NEGATIVE
Nitrite: NEGATIVE
PROTEIN: 100 mg/dL — AB
SPECIFIC GRAVITY, URINE: 1.033 — AB (ref 1.005–1.030)
pH: 5 (ref 5.0–8.0)

## 2016-11-17 LAB — WET PREP, GENITAL
Sperm: NONE SEEN
TRICH WET PREP: NONE SEEN
WBC WET PREP: NONE SEEN
Yeast Wet Prep HPF POC: NONE SEEN

## 2016-11-17 LAB — RAPID URINE DRUG SCREEN, HOSP PERFORMED
Amphetamines: NOT DETECTED
Barbiturates: NOT DETECTED
Benzodiazepines: NOT DETECTED
Cocaine: NOT DETECTED
OPIATES: NOT DETECTED
TETRAHYDROCANNABINOL: POSITIVE — AB

## 2016-11-17 LAB — PREGNANCY, URINE: PREG TEST UR: NEGATIVE

## 2016-11-17 LAB — MAGNESIUM: Magnesium: 2 mg/dL (ref 1.7–2.4)

## 2016-11-17 MED ORDER — ACETAMINOPHEN 650 MG RE SUPP: 650.0000 mg | Freq: Four times a day (QID) | RECTAL | Status: DC | PRN

## 2016-11-17 MED ORDER — KETOROLAC TROMETHAMINE 30 MG/ML IJ SOLN
30.0000 mg | Freq: Once | INTRAMUSCULAR | Status: AC
  Administered 2016-11-17: 30 mg via INTRAVENOUS
  Filled 2016-11-17: qty 1

## 2016-11-17 MED ORDER — POTASSIUM CHLORIDE CRYS ER 20 MEQ PO TBCR
20.0000 meq | EXTENDED_RELEASE_TABLET | Freq: Once | ORAL | Status: AC
  Administered 2016-11-18: 20 meq via ORAL
  Filled 2016-11-17: qty 1

## 2016-11-17 MED ORDER — BISACODYL 10 MG RE SUPP: 10.0000 mg | Freq: Every day | RECTAL | Status: DC | PRN

## 2016-11-17 MED ORDER — HYDROMORPHONE HCL 1 MG/ML IJ SOLN
1.0000 mg | INTRAMUSCULAR | Status: DC | PRN
  Administered 2016-11-17 – 2016-11-18 (×3): 1 mg via INTRAVENOUS
  Filled 2016-11-17 (×3): qty 1

## 2016-11-17 MED ORDER — SENNOSIDES-DOCUSATE SODIUM 8.6-50 MG PO TABS: 1.0000 | ORAL_TABLET | Freq: Every evening | ORAL | Status: DC | PRN

## 2016-11-17 MED ORDER — ONDANSETRON HCL 4 MG/2ML IJ SOLN: 4.0000 mg | Freq: Once | INTRAMUSCULAR | Status: DC

## 2016-11-17 MED ORDER — SUCRALFATE 1 GM/10ML PO SUSP: 1.0000 g | Freq: Three times a day (TID) | ORAL | Status: DC

## 2016-11-17 MED ORDER — PNEUMOCOCCAL VAC POLYVALENT 25 MCG/0.5ML IJ INJ
0.5000 mL | INJECTION | INTRAMUSCULAR | Status: AC
  Administered 2016-11-18: 0.5 mL via INTRAMUSCULAR
  Filled 2016-11-17: qty 0.5

## 2016-11-17 MED ORDER — POTASSIUM CHLORIDE 10 MEQ/100ML IV SOLN
10.0000 meq | INTRAVENOUS | Status: AC
  Administered 2016-11-17 (×2): 10 meq via INTRAVENOUS
  Filled 2016-11-17 (×4): qty 100

## 2016-11-17 MED ORDER — METRONIDAZOLE IN NACL 5-0.79 MG/ML-% IV SOLN
500.0000 mg | Freq: Three times a day (TID) | INTRAVENOUS | Status: DC
  Administered 2016-11-17 – 2016-11-18 (×2): 500 mg via INTRAVENOUS
  Filled 2016-11-17 (×2): qty 100

## 2016-11-17 MED ORDER — SODIUM CHLORIDE 0.9 % IV BOLUS (SEPSIS): 1000.0000 mL | Freq: Once | INTRAVENOUS | Status: DC

## 2016-11-17 MED ORDER — POTASSIUM CHLORIDE 10 MEQ/100ML IV SOLN
10.0000 meq | Freq: Once | INTRAVENOUS | Status: AC
  Administered 2016-11-17: 10 meq via INTRAVENOUS
  Filled 2016-11-17: qty 100

## 2016-11-17 MED ORDER — ONDANSETRON HCL 4 MG/2ML IJ SOLN: 4.0000 mg | Freq: Four times a day (QID) | INTRAMUSCULAR | Status: DC | PRN

## 2016-11-17 MED ORDER — KETOROLAC TROMETHAMINE 30 MG/ML IJ SOLN
30.0000 mg | Freq: Three times a day (TID) | INTRAMUSCULAR | Status: AC | PRN
  Administered 2016-11-17 – 2016-11-18 (×2): 30 mg via INTRAVENOUS
  Filled 2016-11-17 (×3): qty 1

## 2016-11-17 MED ORDER — SODIUM CHLORIDE 0.9 % IV BOLUS (SEPSIS)
1000.0000 mL | Freq: Once | INTRAVENOUS | Status: AC
  Administered 2016-11-17: 1000 mL via INTRAVENOUS

## 2016-11-17 MED ORDER — HYDROMORPHONE HCL 1 MG/ML IJ SOLN
1.0000 mg | Freq: Once | INTRAMUSCULAR | Status: AC
  Administered 2016-11-17: 1 mg via INTRAVENOUS
  Filled 2016-11-17: qty 1

## 2016-11-17 MED ORDER — POTASSIUM CHLORIDE 10 MEQ/100ML IV SOLN
10.0000 meq | INTRAVENOUS | Status: AC
  Administered 2016-11-17 – 2016-11-18 (×3): 10 meq via INTRAVENOUS
  Filled 2016-11-17: qty 100

## 2016-11-17 MED ORDER — ACETAMINOPHEN 325 MG PO TABS
650.0000 mg | ORAL_TABLET | Freq: Four times a day (QID) | ORAL | Status: DC | PRN
  Administered 2016-11-18 – 2016-11-19 (×2): 650 mg via ORAL
  Filled 2016-11-17 (×2): qty 2

## 2016-11-17 MED ORDER — ENOXAPARIN SODIUM 40 MG/0.4ML ~~LOC~~ SOLN
40.0000 mg | SUBCUTANEOUS | Status: DC
  Filled 2016-11-17: qty 0.4

## 2016-11-17 MED ORDER — PANTOPRAZOLE SODIUM 40 MG IV SOLR: 40.0000 mg | Freq: Once | INTRAVENOUS | Status: DC

## 2016-11-17 MED ORDER — PROCHLORPERAZINE 25 MG RE SUPP
25.0000 mg | Freq: Two times a day (BID) | RECTAL | Status: DC | PRN
  Filled 2016-11-17: qty 1

## 2016-11-17 MED ORDER — SODIUM CHLORIDE 0.9 % IV SOLN
INTRAVENOUS | Status: DC
  Administered 2016-11-17 (×2): via INTRAVENOUS

## 2016-11-17 MED ORDER — ONDANSETRON HCL 4 MG PO TABS: 4.0000 mg | ORAL_TABLET | Freq: Four times a day (QID) | ORAL | Status: DC | PRN

## 2016-11-17 NOTE — ED Triage Notes (Signed)
Pt arrives from home via [facility]. Pt states she has been having abdominal pain rt uterine fibroids for fourteen days. Pt has been to several different places for this same complaint.

## 2016-11-17 NOTE — H&P (Signed)
History and Physical    Carrie Guerra BUL:[id number] DOB: May 08, 1986 DOA: 11/17/2016   PCP: Ob/Gyn, [facility]   Patient coming from:  Home    Chief Complaint: pelvic pain   HPI: Carrie Guerra is a 31 y.o. female with medical history significant for cannabis hyperemesis, last marijuana taken 5 days ago, history of fibroids, recently at the emergency department with same, no presenting with lower pelvic pain, right greater than left, acute, accompanied by white frothy vaginal discharge. Patient denies any fever or chills or night sweats. She tried pain medications, and antiemetics, without improvement. The patient has vomited several times prior to presenting to the emergency department. Denies shortness of breath and chest pain. Denies any recent long distance travel. Denies any sick contacts. She has my diarrhea after taking enema but no bowel movements were seen at the emergency department. She denies any other areas of [place]. No confusion is reported. No leg swelling.  ED Course:  BP 107/69   Pulse 74   Temp 97.6 F (36.4 C) (Oral)   Resp 15   LMP 11/09/2016 (Exact Date)   SpO2 99%    potassium 2.5. EKG sinus rhythm without ACS received 1 L of IV fluids received KCl times 1 run CBC and CMET  are otherwise unremarkable UA essentially unremarkable UDS positive for cannabis.  Review of Systems:  As per HPI otherwise all other systems reviewed and are negative  Past Medical History:  Diagnosis Date  . Asthma   . Cannabis hyperemesis syndrome concurrent with and due to cannabis abuse ([place]) 11/09/2016  . Fibroids   . Medical history non-contributory     Past Surgical History:  Procedure Laterality Date  . NO PAST SURGERIES      Social History Social History   Social History  . Marital status: Single    Spouse name: N/A  . Number of children: N/A  . Years of education: N/A   Occupational History  . Not on file.   Social History Main Topics  . Smoking status:  Current Every Day Smoker    Types: Cigarettes  . Smokeless tobacco: Never Used  . Alcohol use Yes     Comment: socially  . Drug use: Yes    Types: Marijuana  . Sexual activity: Yes    Birth control/ protection: None   Other Topics Concern  . Not on file   Social History Narrative  . No narrative on file     Allergies  Allergen Reactions  . Shrimp [Shellfish Allergy] Anaphylaxis  . Cabbage Hives    Pt had to be taken to ER for this reaction     Family History  Problem Relation Age of Onset  . Diabetes Mother   . Diabetes Other       Prior to Admission medications   Medication Sig Start Date End Date Taking? Authorizing Provider  IBU 800 MG tablet Take 800 mg by mouth every 8 (eight) hours. 11/11/16  Yes [provider]  meclizine (ANTIVERT) 25 MG tablet Take 50 mg by mouth daily as needed for nausea.   Yes [provider]  methocarbamol (ROBAXIN) 500 MG tablet Take 1,000 mg by mouth 4 (four) times daily. 11/11/16  Yes [provider]  ondansetron (ZOFRAN ODT) 8 MG disintegrating tablet Take 1 tablet (8 mg total) by mouth every 8 (eight) hours as needed for nausea or vomiting. 11/09/16  Yes Laury Deep, CNM  oxyCODONE-acetaminophen (PERCOCET/ROXICET) 5-325 MG tablet Take 1 tablet by mouth every 4 (  four) hours as needed for severe pain. 10/09/16  Yes Virgel Manifold, MD  prochlorperazine (COMPAZINE) 25 MG suppository Place 1 suppository (25 mg total) rectally every 12 (twelve) hours as needed for nausea or vomiting. 11/08/16  Yes Lorin Glass, PA-C  sucralfate (CARAFATE) 1 GM/10ML suspension Take 10 mLs (1 g total) by mouth 4 (four) times daily -  with meals and at bedtime. Patient not taking: Reported on 11/17/2016 11/10/16   Randal Buba, April, MD    Physical Exam:  Vitals:   11/17/16 1301 11/17/16 1400 11/17/16 1430 11/17/16 1500  BP: 136/85 125/79 111/71 107/69  Pulse: 93 92 80 74  Resp: 18 (!) 28 15 15   Temp:      TempSrc:      SpO2:  100% 99% 99% 99%   Constitutional: very uncomfortable, rolling side to side due to significant pain  Eyes: PERRL, lids and conjunctivae normal ENMT: Mucous membranes are moist, without exudate or lesions  Neck: normal, supple, no masses, no thyromegaly Respiratory: clear to auscultation bilaterally, no wheezing, no crackles. Normal respiratory effort  Cardiovascular: Regular rate and rhythm, no murmurs, rubs or gallops. No extremity edema. 2+ pedal pulses. No carotid bruits.  Abdomen: Soft, very tender at the pelvic area, R>L with guarding , no masses palpable No hepatosplenomegaly. Bowel sounds positive.  Musculoskeletal: no clubbing / cyanosis. Moves all extremities Skin: no jaundice, No lesions. Multiple tattoos Neurologic: Sensation intact  Strength equal in all extremities Psychiatric:   Alert and oriented x 3. Anxious mood.     Labs on Admission: I have personally reviewed following labs and imaging studies  CBC:  Recent Labs Lab 11/17/16 1018  WBC 6.3  NEUTROABS 4.2  HGB 11.6*  HCT 35.2*  MCV 85.9  PLT 854    Basic Metabolic Panel:  Recent Labs Lab 11/17/16 1018  NA 136  K 2.5*  CL 98*  CO2 26  GLUCOSE 99  BUN 8  CREATININE 0.97  CALCIUM 9.2  MG 2.0    GFR: Estimated Creatinine Clearance: 82.3 mL/min (by C-G formula based on SCr of 0.97 mg/dL).  Liver Function Tests:  Recent Labs Lab 11/17/16 1018  AST 27  ALT 22  ALKPHOS 41  BILITOT 1.1  PROT 7.9  ALBUMIN 4.3   No results for input(s): LIPASE, AMYLASE in the last 168 hours. No results for input(s): AMMONIA in the last 168 hours.  Coagulation Profile: No results for input(s): INR, PROTIME in the last 168 hours.  Cardiac Enzymes: No results for input(s): CKTOTAL, CKMB, CKMBINDEX, TROPONINI in the last 168 hours.  BNP (last 3 results) No results for input(s): PROBNP in the last 8760 hours.  HbA1C: No results for input(s): HGBA1C in the last 72 hours.  CBG: No results for input(s):  GLUCAP in the last 168 hours.  Lipid Profile: No results for input(s): CHOL, HDL, LDLCALC, TRIG, CHOLHDL, LDLDIRECT in the last 72 hours.  Thyroid Function Tests: No results for input(s): TSH, T4TOTAL, FREET4, T3FREE, THYROIDAB in the last 72 hours.  Anemia Panel: No results for input(s): VITAMINB12, FOLATE, FERRITIN, TIBC, IRON, RETICCTPCT in the last 72 hours.  Urine analysis:    Component Value Date/Time   COLORURINE AMBER (A) 11/17/2016 1002   APPEARANCEUR HAZY (A) 11/17/2016 1002   LABSPEC 1.033 (H) 11/17/2016 1002   PHURINE 5.0 11/17/2016 1002   GLUCOSEU NEGATIVE 11/17/2016 1002   HGBUR NEGATIVE 11/17/2016 1002   BILIRUBINUR SMALL (A) 11/17/2016 1002   KETONESUR 80 (A) 11/17/2016 1002   PROTEINUR 100 (  A) 11/17/2016 1002   UROBILINOGEN 1.0 03/09/2015 1105   NITRITE NEGATIVE 11/17/2016 1002   LEUKOCYTESUR NEGATIVE 11/17/2016 1002    Sepsis Labs: @LABRCNTIP (procalcitonin:4,lacticidven:4) ) Recent Results (from the past 240 hour(s))  Wet prep, genital     Status: Abnormal   Collection Time: 11/17/16 10:47 AM  Result Value Ref Range Status   Yeast Wet Prep HPF POC NONE SEEN NONE SEEN Final   Trich, Wet Prep NONE SEEN NONE SEEN Final   Clue Cells Wet Prep HPF POC FEW (A) NONE SEEN Final   WBC, Wet Prep HPF POC NONE SEEN NONE SEEN Final   Sperm NONE SEEN  Final     Radiological Exams on Admission: US Transvaginal Non-ob  Result Date: 11/17/2016 CLINICAL DATA:  Pelvic pain for 14 days.  No known injury. EXAM: TRANSABDOMINAL AND TRANSVAGINAL ULTRASOUND OF PELVIS DOPPLER ULTRASOUND OF OVARIES TECHNIQUE: Both transabdominal and transvaginal ultrasound examinations of the pelvis were performed. Transabdominal technique was performed for global imaging of the pelvis including uterus, ovaries, adnexal regions, and pelvic cul-de-sac. It was necessary to proceed with endovaginal exam following the transabdominal exam to visualize the endometrium and adnexa. Color and duplex Doppler  ultrasound was utilized to evaluate blood flow to the ovaries. COMPARISON:  CT abdomen and pelvis 11/14/2016. Pelvic ultrasound 10/12/2016. FINDINGS: Uterus Measurements: 7.7 x 4.5 x 5.4 cm. Three fibroids are identified. The largest measures 3.6 cm in diameter with a second measuring 3.5 cm and a third measuring 1.9 cm. Endometrium Thickness: 11 mm.  No focal abnormality visualized. Right ovary Measurements: 2.2 x 3.2 x 2.0 cm. Normal appearance/no adnexal mass. Left ovary Measurements: 2.3 x 1.7 x 3.7 cm. Normal appearance/no adnexal mass. Pulsed Doppler evaluation of both ovaries demonstrates normal low-resistance arterial and venous waveforms. Other findings No abnormal free fluid. IMPRESSION: No acute abnormality.  Negative for ovarian torsion. 3 uterine fibroids as seen on the prior ultrasound. Electronically Signed   By: Inge Rise M.D.   On: 11/17/2016 13:20   US Pelvis Complete  Result Date: 11/17/2016 CLINICAL DATA:  Pelvic pain for 14 days.  No known injury. EXAM: TRANSABDOMINAL AND TRANSVAGINAL ULTRASOUND OF PELVIS DOPPLER ULTRASOUND OF OVARIES TECHNIQUE: Both transabdominal and transvaginal ultrasound examinations of the pelvis were performed. Transabdominal technique was performed for global imaging of the pelvis including uterus, ovaries, adnexal regions, and pelvic cul-de-sac. It was necessary to proceed with endovaginal exam following the transabdominal exam to visualize the endometrium and adnexa. Color and duplex Doppler ultrasound was utilized to evaluate blood flow to the ovaries. COMPARISON:  CT abdomen and pelvis 11/14/2016. Pelvic ultrasound 10/12/2016. FINDINGS: Uterus Measurements: 7.7 x 4.5 x 5.4 cm. Three fibroids are identified. The largest measures 3.6 cm in diameter with a second measuring 3.5 cm and a third measuring 1.9 cm. Endometrium Thickness: 11 mm.  No focal abnormality visualized. Right ovary Measurements: 2.2 x 3.2 x 2.0 cm. Normal appearance/no adnexal mass. Left  ovary Measurements: 2.3 x 1.7 x 3.7 cm. Normal appearance/no adnexal mass. Pulsed Doppler evaluation of both ovaries demonstrates normal low-resistance arterial and venous waveforms. Other findings No abnormal free fluid. IMPRESSION: No acute abnormality.  Negative for ovarian torsion. 3 uterine fibroids as seen on the prior ultrasound. Electronically Signed   By: Inge Rise M.D.   On: 11/17/2016 13:20   [place] Art/ven Flow Abd Pelv Doppler  Result Date: 11/17/2016 CLINICAL DATA:  Pelvic pain for 14 days.  No known injury. EXAM: TRANSABDOMINAL AND TRANSVAGINAL ULTRASOUND OF PELVIS DOPPLER ULTRASOUND OF OVARIES TECHNIQUE:  Both transabdominal and transvaginal ultrasound examinations of the pelvis were performed. Transabdominal technique was performed for global imaging of the pelvis including uterus, ovaries, adnexal regions, and pelvic cul-de-sac. It was necessary to proceed with endovaginal exam following the transabdominal exam to visualize the endometrium and adnexa. Color and duplex Doppler ultrasound was utilized to evaluate blood flow to the ovaries. COMPARISON:  CT abdomen and pelvis 11/14/2016. Pelvic ultrasound 10/12/2016. FINDINGS: Uterus Measurements: 7.7 x 4.5 x 5.4 cm. Three fibroids are identified. The largest measures 3.6 cm in diameter with a second measuring 3.5 cm and a third measuring 1.9 cm. Endometrium Thickness: 11 mm.  No focal abnormality visualized. Right ovary Measurements: 2.2 x 3.2 x 2.0 cm. Normal appearance/no adnexal mass. Left ovary Measurements: 2.3 x 1.7 x 3.7 cm. Normal appearance/no adnexal mass. Pulsed Doppler evaluation of both ovaries demonstrates normal low-resistance arterial and venous waveforms. Other findings No abnormal free fluid. IMPRESSION: No acute abnormality.  Negative for ovarian torsion. 3 uterine fibroids as seen on the prior ultrasound. Electronically Signed   By: Inge Rise M.D.   On: 11/17/2016 13:20    EKG: Independently reviewed.    Assessment/Plan Active Problems:   Cannabis hyperemesis syndrome concurrent with and due to cannabis abuse ([facility])   Vomiting   Hypokalemia    Pelvic Pain with frequent vomiting in a patient with a history of cannabis hyperemesis syndrome. Patient has intermittent diarrhea, after using enema couple of days prior. Ultrasound of the pelvis shows no acute abnormalities, no ovarian torsion History of uterine fibroids, without worsening findings.   CBC and CMET unremarkable except for hypokalemia due to volume loss. Received 1 L IV NS. UDS positive for Canabis. Wet prep positive for clue cells. preg nest pending Admit to observation telemetry Clear liquids is tolerated Pain control with IV Dilaudid and Toradol   IV hydration at 100 cc/h  IV antiemetics Will await results of  chlamydia and gonorrhea tests Start metronidazole IV biod for Bacterial vaginosis   Hypokalemia, may be due to volume loss Current K  2.5, received 10 meq at the ED  KCL x 4 runs IV  Repeat CMET in am    DVT prophylaxis: Lovenox   Code Status:   Full    Family Communication:  Discussed with patient Disposition Plan: Expect patient to be discharged to home after condition improves Consults called:  none   Admission status:Tele Obs    Tavarius Grewe E, PA-C [facility]   11/17/2016, 4:29 PM

## 2016-11-17 NOTE — ED Notes (Signed)
US at bedside

## 2016-11-17 NOTE — Progress Notes (Signed)
CRITICAL VALUE ALERT  Critical Value:  2.7  Date & Time Notied:  11/17/16 at 2307  Provider Notified: [email or web address]  Orders Received/Actions taken: potassium 10 meq in 100 ml ivpb

## 2016-11-17 NOTE — ED Notes (Signed)
Got patient undress on the monitor waiting for provider 

## 2016-11-17 NOTE — ED Provider Notes (Signed)
[facility] DEPT Provider Note   CSN: [id number] Arrival date & time: 11/17/16  2637     History   Chief Complaint Chief Complaint  Patient presents with  . Abdominal Pain    HPI Carrie Guerra is a 31 y.o. female.Patient brought by EMS complaining of lower crampy abdominal pain intermittent for the past 2 weeks. Last bowel movement proximal me 6 days ago after treatment with an enema. No fever. No urinary symptoms. Last normal menstrual period 8 days ago. Nothing makes symptoms better or worse symptoms severe. No nausea presently,, however she did vomit this morning. She says she's vomited multiple times since last night. Been treated with Percocet, ibuprofen and Zofran without relief  HPI  Past Medical History:  Diagnosis Date  . Asthma   . Cannabis hyperemesis syndrome concurrent with and due to cannabis abuse ([place]) 11/09/2016  . Fibroids   . Medical history non-contributory     Patient Active Problem List   Diagnosis Date Noted  . Cannabis hyperemesis syndrome concurrent with and due to cannabis abuse ([place]) 11/09/2016    Past Surgical History:  Procedure Laterality Date  . NO PAST SURGERIES      OB History    Gravida Para Term Preterm AB Living   0 0 0 0 0 0   SAB TAB Ectopic Multiple Live Births   0 0 0 0 0       Home Medications    Prior to Admission medications   Medication Sig Start Date End Date Taking? Authorizing Provider  ondansetron (ZOFRAN ODT) 8 MG disintegrating tablet Take 1 tablet (8 mg total) by mouth every 8 (eight) hours as needed for nausea or vomiting. 11/09/16   Laury Deep, CNM  oxyCODONE-acetaminophen (PERCOCET/ROXICET) 5-325 MG tablet Take 1 tablet by mouth every 4 (four) hours as needed for severe pain. 10/09/16   Virgel Manifold, MD  prochlorperazine (COMPAZINE) 25 MG suppository Place 1 suppository (25 mg total) rectally every 12 (twelve) hours as needed for nausea or vomiting. 11/08/16   Lorin Glass, PA-C  sucralfate  (CARAFATE) 1 GM/10ML suspension Take 10 mLs (1 g total) by mouth 4 (four) times daily -  with meals and at bedtime. 11/10/16   Palumbo, April, MD    Family History Family History  Problem Relation Age of Onset  . Diabetes Mother   . Diabetes Other     Social History Social History  Substance Use Topics  . Smoking status: Current Every Day Smoker    Types: Cigarettes  . Smokeless tobacco: Never Used  . Alcohol use Yes     Comment: socially   Positive marijuana use last time 3 weeks ago  Allergies   Shrimp [shellfish allergy] and Cabbage   Review of Systems Review of Systems  Constitutional: Negative.   HENT: Negative.   Respiratory: Negative.   Cardiovascular: Negative.   Gastrointestinal: Positive for abdominal pain, nausea and vomiting.       No nausea at present  Musculoskeletal: Negative.   Skin: Negative.   Neurological: Negative.   Psychiatric/Behavioral: Negative.   All other systems reviewed and are negative.    Physical Exam Updated Vital Signs BP 121/88 (BP Location: Right Arm)   Pulse 88   Temp 97.6 F (36.4 C) (Oral)   Resp 20   LMP 11/09/2016 (Exact Date)   SpO2 99%   Physical Exam  Constitutional: She appears well-developed and well-nourished. She appears distressed.  Appears uncomfortable, rocking back and forth on the bed  HENT:  Head: Normocephalic and atraumatic.  Eyes: Conjunctivae are normal. Pupils are equal, round, and reactive to light.  Neck: Neck supple. No tracheal deviation present. No thyromegaly present.  Cardiovascular: Normal rate and regular rhythm.   No murmur heard. Pulmonary/Chest: Effort normal and breath sounds normal.  Abdominal: Soft. Bowel sounds are normal. She exhibits no distension. There is no tenderness.  Genitourinary:  Genitourinary Comments: Pelvic exam no external lesion cervical os closed. No cervical motion tenderness positive uterine fundus tender at this  Musculoskeletal: Normal range of motion. She  exhibits no edema or tenderness.  Neurological: She is alert. Coordination normal.  Skin: Skin is warm and dry. No rash noted.  Psychiatric: She has a normal mood and affect.  Nursing note and vitals reviewed.  Results for orders placed or performed during the hospital encounter of 11/17/16  Wet prep, genital  Result Value Ref Range   Yeast Wet Prep HPF POC NONE SEEN NONE SEEN   Trich, Wet Prep NONE SEEN NONE SEEN   Clue Cells Wet Prep HPF POC FEW (A) NONE SEEN   WBC, Wet Prep HPF POC NONE SEEN NONE SEEN   Sperm NONE SEEN   Comprehensive metabolic panel  Result Value Ref Range   Sodium 136 135 - 145 mmol/L   Potassium 2.5 (LL) 3.5 - 5.1 mmol/L   Chloride 98 (L) 101 - 111 mmol/L   CO2 26 22 - 32 mmol/L   Glucose, Bld 99 65 - 99 mg/dL   BUN 8 6 - 20 mg/dL   Creatinine, Ser 0.97 0.44 - 1.00 mg/dL   Calcium 9.2 8.9 - 10.3 mg/dL   Total Protein 7.9 6.5 - 8.1 g/dL   Albumin 4.3 3.5 - 5.0 g/dL   AST 27 15 - 41 U/L   ALT 22 14 - 54 U/L   Alkaline Phosphatase 41 38 - 126 U/L   Total Bilirubin 1.1 0.3 - 1.2 mg/dL   GFR calc non Af Amer >60 >60 mL/min   GFR calc Af Amer >60 >60 mL/min   Anion gap 12 5 - 15  CBC with Differential/Platelet  Result Value Ref Range   WBC 6.3 4.0 - 10.5 K/uL   RBC 4.10 3.87 - 5.11 MIL/uL   Hemoglobin 11.6 (L) 12.0 - 15.0 g/dL   HCT 35.2 (L) 36.0 - 46.0 %   MCV 85.9 78.0 - 100.0 fL   MCH 28.3 26.0 - 34.0 pg   MCHC 33.0 30.0 - 36.0 g/dL   RDW 12.4 11.5 - 15.5 %   Platelets 363 150 - 400 K/uL   Neutrophils Relative % 65 %   Neutro Abs 4.2 1.7 - 7.7 K/uL   Lymphocytes Relative 22 %   Lymphs Abs 1.4 0.7 - 4.0 K/uL   Monocytes Relative 11 %   Monocytes Absolute 0.7 0.1 - 1.0 K/uL   Eosinophils Relative 1 %   Eosinophils Absolute 0.0 0.0 - 0.7 K/uL   Basophils Relative 1 %   Basophils Absolute 0.0 0.0 - 0.1 K/uL  Urinalysis, Routine w reflex microscopic  Result Value Ref Range   Color, Urine AMBER (A) YELLOW   APPearance HAZY (A) CLEAR   Specific  Gravity, Urine 1.033 (H) 1.005 - 1.030   pH 5.0 5.0 - 8.0   Glucose, UA NEGATIVE NEGATIVE mg/dL   Hgb urine dipstick NEGATIVE NEGATIVE   Bilirubin Urine SMALL (A) NEGATIVE   Ketones, ur 80 (A) NEGATIVE mg/dL   Protein, ur 100 (A) NEGATIVE mg/dL   Nitrite NEGATIVE NEGATIVE  Leukocytes, UA NEGATIVE NEGATIVE   RBC / HPF 0-5 0 - 5 RBC/hpf   WBC, UA 6-30 0 - 5 WBC/hpf   Bacteria, UA MANY (A) NONE SEEN   Squamous Epithelial / LPF 0-5 (A) NONE SEEN   Mucous PRESENT    Hyaline Casts, UA PRESENT   Rapid urine drug screen (hospital performed)  Result Value Ref Range   Opiates NONE DETECTED NONE DETECTED   Cocaine NONE DETECTED NONE DETECTED   Benzodiazepines NONE DETECTED NONE DETECTED   Amphetamines NONE DETECTED NONE DETECTED   Tetrahydrocannabinol POSITIVE (A) NONE DETECTED   Barbiturates NONE DETECTED NONE DETECTED  Pregnancy, urine  Result Value Ref Range   Preg Test, Ur NEGATIVE NEGATIVE   Dg Chest 2 View  Result Date: 11/08/2016 CLINICAL DATA:  Left mid chest and abdominal pain for 3 days EXAM: CHEST  2 VIEW COMPARISON:  10/05/2016 FINDINGS: Heart and mediastinal contours are within normal limits. No focal opacities or effusions. No acute bony abnormality. IMPRESSION: No active cardiopulmonary disease. Electronically Signed   By: Rolm Baptise M.D.   On: 11/08/2016 08:49   Ct Abdomen Pelvis W Contrast  Result Date: 11/08/2016 CLINICAL DATA:  Left upper chest pain, left lower quadrant pain. EXAM: CT ABDOMEN AND PELVIS WITH CONTRAST TECHNIQUE: Multidetector CT imaging of the abdomen and pelvis was performed using the standard protocol following bolus administration of intravenous contrast. CONTRAST:  162mL ISOVUE-300 IOPAMIDOL (ISOVUE-300) INJECTION 61% COMPARISON:  CT 10/09/2016 FINDINGS: Lower chest: Circumferential wall thickening in the visualized lower esophagus. Lungs are clear. No effusions. Hepatobiliary: No focal hepatic abnormality. Gallbladder unremarkable. Pancreas: No focal  abnormality or ductal dilatation. Spleen: No focal abnormality.  Normal size. Adrenals/Urinary Tract: No adrenal abnormality. No focal renal abnormality. No stones or hydronephrosis. Urinary bladder is unremarkable. Stomach/Bowel: Appendix is normal. Stomach, large and small bowel grossly unremarkable. Vascular/Lymphatic: No evidence of aneurysm or adenopathy. Reproductive: Enlarged fibroid uterus.  No adnexal masses. Other: Trace free fluid in the pelvis.  No free air. Musculoskeletal: No acute bony abnormality. IMPRESSION: Circumferential wall thickening in the distal esophagus compatible with esophagitis. Fibroid uterus. Normal appendix. No acute findings in the abdomen or pelvis. Electronically Signed   By: Rolm Baptise M.D.   On: 11/08/2016 09:44    ED Treatments / Results  Labs (all labs ordered are listed, but only abnormal results are displayed) Labs Reviewed  COMPREHENSIVE METABOLIC PANEL  CBC WITH DIFFERENTIAL/PLATELET  URINALYSIS, ROUTINE W REFLEX MICROSCOPIC  POC URINE PREG, ED    EKG  EKG Interpretation None      Results for orders placed or performed during the hospital encounter of 11/17/16  Wet prep, genital  Result Value Ref Range   Yeast Wet Prep HPF POC NONE SEEN NONE SEEN   Trich, Wet Prep NONE SEEN NONE SEEN   Clue Cells Wet Prep HPF POC FEW (A) NONE SEEN   WBC, Wet Prep HPF POC NONE SEEN NONE SEEN   Sperm NONE SEEN   Comprehensive metabolic panel  Result Value Ref Range   Sodium 136 135 - 145 mmol/L   Potassium 2.5 (LL) 3.5 - 5.1 mmol/L   Chloride 98 (L) 101 - 111 mmol/L   CO2 26 22 - 32 mmol/L   Glucose, Bld 99 65 - 99 mg/dL   BUN 8 6 - 20 mg/dL   Creatinine, Ser 0.97 0.44 - 1.00 mg/dL   Calcium 9.2 8.9 - 10.3 mg/dL   Total Protein 7.9 6.5 - 8.1 g/dL   Albumin 4.3 3.5 -  5.0 g/dL   AST 27 15 - 41 U/L   ALT 22 14 - 54 U/L   Alkaline Phosphatase 41 38 - 126 U/L   Total Bilirubin 1.1 0.3 - 1.2 mg/dL   GFR calc non Af Amer >60 >60 mL/min   GFR calc Af  Amer >60 >60 mL/min   Anion gap 12 5 - 15  CBC with Differential/Platelet  Result Value Ref Range   WBC 6.3 4.0 - 10.5 K/uL   RBC 4.10 3.87 - 5.11 MIL/uL   Hemoglobin 11.6 (L) 12.0 - 15.0 g/dL   HCT 35.2 (L) 36.0 - 46.0 %   MCV 85.9 78.0 - 100.0 fL   MCH 28.3 26.0 - 34.0 pg   MCHC 33.0 30.0 - 36.0 g/dL   RDW 12.4 11.5 - 15.5 %   Platelets 363 150 - 400 K/uL   Neutrophils Relative % 65 %   Neutro Abs 4.2 1.7 - 7.7 K/uL   Lymphocytes Relative 22 %   Lymphs Abs 1.4 0.7 - 4.0 K/uL   Monocytes Relative 11 %   Monocytes Absolute 0.7 0.1 - 1.0 K/uL   Eosinophils Relative 1 %   Eosinophils Absolute 0.0 0.0 - 0.7 K/uL   Basophils Relative 1 %   Basophils Absolute 0.0 0.0 - 0.1 K/uL  Urinalysis, Routine w reflex microscopic  Result Value Ref Range   Color, Urine AMBER (A) YELLOW   APPearance HAZY (A) CLEAR   Specific Gravity, Urine 1.033 (H) 1.005 - 1.030   pH 5.0 5.0 - 8.0   Glucose, UA NEGATIVE NEGATIVE mg/dL   Hgb urine dipstick NEGATIVE NEGATIVE   Bilirubin Urine SMALL (A) NEGATIVE   Ketones, ur 80 (A) NEGATIVE mg/dL   Protein, ur 100 (A) NEGATIVE mg/dL   Nitrite NEGATIVE NEGATIVE   Leukocytes, UA NEGATIVE NEGATIVE   RBC / HPF 0-5 0 - 5 RBC/hpf   WBC, UA 6-30 0 - 5 WBC/hpf   Bacteria, UA MANY (A) NONE SEEN   Squamous Epithelial / LPF 0-5 (A) NONE SEEN   Mucous PRESENT    Hyaline Casts, UA PRESENT   Rapid urine drug screen (hospital performed)  Result Value Ref Range   Opiates NONE DETECTED NONE DETECTED   Cocaine NONE DETECTED NONE DETECTED   Benzodiazepines NONE DETECTED NONE DETECTED   Amphetamines NONE DETECTED NONE DETECTED   Tetrahydrocannabinol POSITIVE (A) NONE DETECTED   Barbiturates NONE DETECTED NONE DETECTED  Pregnancy, urine  Result Value Ref Range   Preg Test, Ur NEGATIVE NEGATIVE  Magnesium  Result Value Ref Range   Magnesium 2.0 1.7 - 2.4 mg/dL   Dg Chest 2 View  Result Date: 11/08/2016 CLINICAL DATA:  Left mid chest and abdominal pain for 3 days  EXAM: CHEST  2 VIEW COMPARISON:  10/05/2016 FINDINGS: Heart and mediastinal contours are within normal limits. No focal opacities or effusions. No acute bony abnormality. IMPRESSION: No active cardiopulmonary disease. Electronically Signed   By: Rolm Baptise M.D.   On: 11/08/2016 08:49   US Transvaginal Non-ob  Result Date: 11/17/2016 CLINICAL DATA:  Pelvic pain for 14 days.  No known injury. EXAM: TRANSABDOMINAL AND TRANSVAGINAL ULTRASOUND OF PELVIS DOPPLER ULTRASOUND OF OVARIES TECHNIQUE: Both transabdominal and transvaginal ultrasound examinations of the pelvis were performed. Transabdominal technique was performed for global imaging of the pelvis including uterus, ovaries, adnexal regions, and pelvic cul-de-sac. It was necessary to proceed with endovaginal exam following the transabdominal exam to visualize the endometrium and adnexa. Color and duplex Doppler ultrasound was utilized  to evaluate blood flow to the ovaries. COMPARISON:  CT abdomen and pelvis 11/14/2016. Pelvic ultrasound 10/12/2016. FINDINGS: Uterus Measurements: 7.7 x 4.5 x 5.4 cm. Three fibroids are identified. The largest measures 3.6 cm in diameter with a second measuring 3.5 cm and a third measuring 1.9 cm. Endometrium Thickness: 11 mm.  No focal abnormality visualized. Right ovary Measurements: 2.2 x 3.2 x 2.0 cm. Normal appearance/no adnexal mass. Left ovary Measurements: 2.3 x 1.7 x 3.7 cm. Normal appearance/no adnexal mass. Pulsed Doppler evaluation of both ovaries demonstrates normal low-resistance arterial and venous waveforms. Other findings No abnormal free fluid. IMPRESSION: No acute abnormality.  Negative for ovarian torsion. 3 uterine fibroids as seen on the prior ultrasound. Electronically Signed   By: Inge Rise M.D.   On: 11/17/2016 13:20   US Pelvis Complete  Result Date: 11/17/2016 CLINICAL DATA:  Pelvic pain for 14 days.  No known injury. EXAM: TRANSABDOMINAL AND TRANSVAGINAL ULTRASOUND OF PELVIS DOPPLER  ULTRASOUND OF OVARIES TECHNIQUE: Both transabdominal and transvaginal ultrasound examinations of the pelvis were performed. Transabdominal technique was performed for global imaging of the pelvis including uterus, ovaries, adnexal regions, and pelvic cul-de-sac. It was necessary to proceed with endovaginal exam following the transabdominal exam to visualize the endometrium and adnexa. Color and duplex Doppler ultrasound was utilized to evaluate blood flow to the ovaries. COMPARISON:  CT abdomen and pelvis 11/14/2016. Pelvic ultrasound 10/12/2016. FINDINGS: Uterus Measurements: 7.7 x 4.5 x 5.4 cm. Three fibroids are identified. The largest measures 3.6 cm in diameter with a second measuring 3.5 cm and a third measuring 1.9 cm. Endometrium Thickness: 11 mm.  No focal abnormality visualized. Right ovary Measurements: 2.2 x 3.2 x 2.0 cm. Normal appearance/no adnexal mass. Left ovary Measurements: 2.3 x 1.7 x 3.7 cm. Normal appearance/no adnexal mass. Pulsed Doppler evaluation of both ovaries demonstrates normal low-resistance arterial and venous waveforms. Other findings No abnormal free fluid. IMPRESSION: No acute abnormality.  Negative for ovarian torsion. 3 uterine fibroids as seen on the prior ultrasound. Electronically Signed   By: Inge Rise M.D.   On: 11/17/2016 13:20   Ct Abdomen Pelvis W Contrast  Result Date: 11/08/2016 CLINICAL DATA:  Left upper chest pain, left lower quadrant pain. EXAM: CT ABDOMEN AND PELVIS WITH CONTRAST TECHNIQUE: Multidetector CT imaging of the abdomen and pelvis was performed using the standard protocol following bolus administration of intravenous contrast. CONTRAST:  170mL ISOVUE-300 IOPAMIDOL (ISOVUE-300) INJECTION 61% COMPARISON:  CT 10/09/2016 FINDINGS: Lower chest: Circumferential wall thickening in the visualized lower esophagus. Lungs are clear. No effusions. Hepatobiliary: No focal hepatic abnormality. Gallbladder unremarkable. Pancreas: No focal abnormality or ductal  dilatation. Spleen: No focal abnormality.  Normal size. Adrenals/Urinary Tract: No adrenal abnormality. No focal renal abnormality. No stones or hydronephrosis. Urinary bladder is unremarkable. Stomach/Bowel: Appendix is normal. Stomach, large and small bowel grossly unremarkable. Vascular/Lymphatic: No evidence of aneurysm or adenopathy. Reproductive: Enlarged fibroid uterus.  No adnexal masses. Other: Trace free fluid in the pelvis.  No free air. Musculoskeletal: No acute bony abnormality. IMPRESSION: Circumferential wall thickening in the distal esophagus compatible with esophagitis. Fibroid uterus. Normal appendix. No acute findings in the abdomen or pelvis. Electronically Signed   By: Rolm Baptise M.D.   On: 11/08/2016 09:44   [place] Art/ven Flow Abd Pelv Doppler  Result Date: 11/17/2016 CLINICAL DATA:  Pelvic pain for 14 days.  No known injury. EXAM: TRANSABDOMINAL AND TRANSVAGINAL ULTRASOUND OF PELVIS DOPPLER ULTRASOUND OF OVARIES TECHNIQUE: Both transabdominal and transvaginal ultrasound examinations of the pelvis were  performed. Transabdominal technique was performed for global imaging of the pelvis including uterus, ovaries, adnexal regions, and pelvic cul-de-sac. It was necessary to proceed with endovaginal exam following the transabdominal exam to visualize the endometrium and adnexa. Color and duplex Doppler ultrasound was utilized to evaluate blood flow to the ovaries. COMPARISON:  CT abdomen and pelvis 11/14/2016. Pelvic ultrasound 10/12/2016. FINDINGS: Uterus Measurements: 7.7 x 4.5 x 5.4 cm. Three fibroids are identified. The largest measures 3.6 cm in diameter with a second measuring 3.5 cm and a third measuring 1.9 cm. Endometrium Thickness: 11 mm.  No focal abnormality visualized. Right ovary Measurements: 2.2 x 3.2 x 2.0 cm. Normal appearance/no adnexal mass. Left ovary Measurements: 2.3 x 1.7 x 3.7 cm. Normal appearance/no adnexal mass. Pulsed Doppler evaluation of both ovaries demonstrates  normal low-resistance arterial and venous waveforms. Other findings No abnormal free fluid. IMPRESSION: No acute abnormality.  Negative for ovarian torsion. 3 uterine fibroids as seen on the prior ultrasound. Electronically Signed   By: Inge Rise M.D.   On: 11/17/2016 13:20   Radiology No results found.  Procedures Procedures (including critical care time)  Medications Ordered in ED Medications  sodium chloride 0.9 % bolus 1,000 mL (not administered)    10:55 AM pain improved after treatment with intravenous hydromorphone Initial Impression / Assessment and Plan / ED Course  I have reviewed the triage vital signs and the nursing notes.  Pertinent labs & imaging results that were available during my care of the patient were reviewed by me and considered in my medical decision making (see chart for details).     2:35 PM pain is improved after treatment with intravenous Toradol. I discussed case with Dr. Philis Pique from gynecology. She feels that is unlikely patient would have pain this significant from fibroids. Vomiting is likely secondary to cannabis abuse. She is  hypokalemic likely secondary to vomiting . IV potassium supplementation ordered. Hospitalist service consulted will see patient in ED and arrange for 23 hour observation to telemetry  Final Clinical Impressions(s) / ED Diagnoses  Diagnosis #1 hypokalemia #2 nausea and vomiting #3 low abdominal pain Final diagnoses:  None    New Prescriptions New Prescriptions   No medications on file     Orlie Dakin, MD 11/17/16 1440

## 2016-11-18 DIAGNOSIS — G43A0 Cyclical vomiting, not intractable: Secondary | ICD-10-CM

## 2016-11-18 LAB — CBC
HCT: 30.5 % — ABNORMAL LOW (ref 36.0–46.0)
Hemoglobin: 9.9 g/dL — ABNORMAL LOW (ref 12.0–15.0)
MCH: 28.6 pg (ref 26.0–34.0)
MCHC: 32.5 g/dL (ref 30.0–36.0)
MCV: 88.2 fL (ref 78.0–100.0)
PLATELETS: 310 10*3/uL (ref 150–400)
RBC: 3.46 MIL/uL — AB (ref 3.87–5.11)
RDW: 12.5 % (ref 11.5–15.5)
WBC: 4.4 10*3/uL (ref 4.0–10.5)

## 2016-11-18 LAB — COMPREHENSIVE METABOLIC PANEL
ALK PHOS: 32 U/L — AB (ref 38–126)
ALT: 18 U/L (ref 14–54)
AST: 19 U/L (ref 15–41)
Albumin: 3.3 g/dL — ABNORMAL LOW (ref 3.5–5.0)
Anion gap: 7 (ref 5–15)
BILIRUBIN TOTAL: 0.9 mg/dL (ref 0.3–1.2)
CALCIUM: 8.3 mg/dL — AB (ref 8.9–10.3)
CHLORIDE: 105 mmol/L (ref 101–111)
CO2: 23 mmol/L (ref 22–32)
CREATININE: 0.69 mg/dL (ref 0.44–1.00)
Glucose, Bld: 79 mg/dL (ref 65–99)
Potassium: 3.1 mmol/L — ABNORMAL LOW (ref 3.5–5.1)
Sodium: 135 mmol/L (ref 135–145)
TOTAL PROTEIN: 6 g/dL — AB (ref 6.5–8.1)

## 2016-11-18 LAB — RPR: RPR: NONREACTIVE

## 2016-11-18 LAB — HIV ANTIBODY (ROUTINE TESTING W REFLEX): HIV Screen 4th Generation wRfx: NONREACTIVE

## 2016-11-18 LAB — URINE CULTURE: Culture: <10000 — AB

## 2016-11-18 MED ORDER — METRONIDAZOLE 500 MG PO TABS
500.0000 mg | ORAL_TABLET | Freq: Two times a day (BID) | ORAL | Status: DC
  Administered 2016-11-18 – 2016-11-19 (×2): 500 mg via ORAL
  Filled 2016-11-18 (×2): qty 1

## 2016-11-18 MED ORDER — GI COCKTAIL ~~LOC~~
30.0000 mL | Freq: Three times a day (TID) | ORAL | Status: DC | PRN
  Filled 2016-11-18: qty 30

## 2016-11-18 MED ORDER — PANTOPRAZOLE SODIUM 40 MG PO TBEC
40.0000 mg | DELAYED_RELEASE_TABLET | Freq: Two times a day (BID) | ORAL | Status: DC
  Administered 2016-11-18 – 2016-11-19 (×3): 40 mg via ORAL
  Filled 2016-11-18 (×3): qty 1

## 2016-11-18 MED ORDER — SUCRALFATE 1 GM/10ML PO SUSP
1.0000 g | Freq: Three times a day (TID) | ORAL | Status: DC
  Administered 2016-11-18 – 2016-11-19 (×5): 1 g via ORAL
  Filled 2016-11-18 (×5): qty 10

## 2016-11-18 MED ORDER — METHOCARBAMOL 500 MG PO TABS
500.0000 mg | ORAL_TABLET | Freq: Three times a day (TID) | ORAL | Status: DC
  Administered 2016-11-18 – 2016-11-19 (×3): 500 mg via ORAL
  Filled 2016-11-18 (×3): qty 1

## 2016-11-18 MED ORDER — NAPROXEN 250 MG PO TABS
500.0000 mg | ORAL_TABLET | Freq: Three times a day (TID) | ORAL | Status: DC
  Administered 2016-11-18 – 2016-11-19 (×3): 500 mg via ORAL
  Filled 2016-11-18 (×3): qty 2

## 2016-11-18 MED ORDER — POTASSIUM CHLORIDE CRYS ER 20 MEQ PO TBCR
40.0000 meq | EXTENDED_RELEASE_TABLET | Freq: Once | ORAL | Status: AC
  Administered 2016-11-18: 40 meq via ORAL
  Filled 2016-11-18: qty 2

## 2016-11-18 NOTE — Progress Notes (Signed)
Nutrition Brief Note  Patient identified on the Malnutrition Screening Tool (MST) Report  Wt Readings from Last 15 Encounters:  11/18/16 164 lb 3.2 oz (74.5 kg)  11/10/16 185 lb (83.9 kg)  11/09/16 185 lb (83.9 kg)  11/07/16 185 lb (83.9 kg)  03/24/16 155 lb (70.3 kg)  10/10/15 150 lb (68 kg)  03/09/15 145 lb (65.8 kg)   Carrie Guerra is a 31 y.o. female with a Past Medical History significant for cannabis related hyperemesis who presents with pelvic pain. Found to have BV.  Pt admitted with BV and cannabis related hyperemesis.   Toxicology screen positive for tetrahydrocannabinol.   Pt not in room at time of visit.   Reviewed wt hx; noted UBW between 145-150#. Admission wt 158#. Suspect wt of 185# is an outlier.   Staff report potential discharge home later today.  Body mass index is 32.07 kg/m. Patient meets criteria for obesity, class I based on current BMI.   Current diet order is soft, patient is consuming approximately n/a% of meals at this time. Labs and medications reviewed.   No nutrition interventions warranted at this time. If nutrition issues arise, please consult RD.   Cheila Wickstrom A. Jimmye Norman, RD, LDN, CDE Pager: [phone] After hours Pager: [phone]

## 2016-11-18 NOTE — Progress Notes (Signed)
[facility] Progress Note  Patient: Carrie Guerra HCW:[id number]   PCP: Patient, No Pcp Per DOB: 31-Dec-1985   DOA: 11/17/2016   DOS: 11/18/2016   Date of Service: the patient was seen and examined on 11/18/2016  Subjective: Continues to have pain in her right groin. Pain is worsened with movement. Rest and pain medication improves the pain. No fever. Also complains about burning urination associated with that. No diarrhea no constipation. Pain has been there since May 14 and persistent.  Brief hospital course: Pt. 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 Currently further plan is diet tolerance.  Assessment and Plan: 1. Nausea and vomiting. 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 Esophagitis seen on CT scan. Patient versus with complains of nausea and vomiting, poor oral intake. CT scan on 5:15 was showing evidence of esophageal thickening. We'll start the patient on Protonix every 12 hours, Carafate, GI cocktail. Monitor response. 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  2. Pelvic pain. Ultrasound negative for any acute abnormality or ovarian torsion. CT scan 11/08/2016 unremarkable as well. Urine unremarkable, wet prep unremarkable. Started on IV Flagyl, with severe pain and positive clue cell I would finish Flagyl course of 7 days.  3. Hypokalemia. Severe. Getting better. We'll continue replacement and recheck tomorrow.  4. Anemia. Likely dilutional. Neck supple abdomen drop from 11.6-9.9, no active bleeding reported. We'll recheck tomorrow.   Diet: Advance to a regular diet. DVT Prophylaxis: subcutaneous Heparin  Advance goals of care discussion: full code  Family Communication: family was present at bedside, at the time of interview. The pt provided  permission to discuss medical plan with the family. Opportunity was given to ask question and all questions were answered satisfactorily.   Disposition:  Discharge to home.  Consultants: none Procedures: none  Antibiotics: Anti-infectives    Start     Dose/Rate Route Frequency Ordered Stop   11/18/16 2200  metroNIDAZOLE (FLAGYL) tablet 500 mg     500 mg Oral 2 times daily 11/18/16 0938 11/25/16 0959   11/17/16 1500  metroNIDAZOLE (FLAGYL) IVPB 500 mg  Status:  Discontinued     500 mg 100 mL/hr over 60 Minutes Intravenous Every 8 hours 11/17/16 1448 11/18/16 0938       Objective: Physical Exam: Vitals:   11/17/16 1632 11/17/16 2208 11/18/16 0534 11/18/16 1430  BP: 130/86 118/67 (!) 105/58 115/64  Pulse: 84 78 72 82  Resp: 16 17 17    Temp: 98 F (36.7 C) 98.2 F (36.8 C) 97.9 F (36.6 C) 98 F (36.7 C)  TempSrc: Oral Oral  Oral  SpO2: 98% 100% 96% 100%  Weight: 71.7 kg (158 lb)  74.5 kg (164 lb 3.2 oz)   Height: 5' (1.524 m)       Intake/Output Summary (Last 24 hours) at 11/18/16 1740 Last data filed at 11/18/16 0700  Gross per 24 hour  Intake             1505 ml  Output             1400 ml  Net              105 ml   Filed Weights   11/17/16 1632 11/18/16 0534  Weight: 71.7 kg (158 lb) 74.5 kg (164 lb 3.2 oz)   General: Alert, Awake and  Oriented to Time, Place and Person. Appear in mild distress, affect appropriate Eyes: PERRL, Conjunctiva normal ENT: Oral Mucosa clear moist. Neck: no JVD, no Abnormal Mass Or lumps Cardiovascular: S1 and S2 Present, no Murmur, Respiratory: Bilateral Air entry equal and Decreased, no use of accessory muscle, Clear to Auscultation, no Crackles, no wheezes Abdomen: Bowel Sound present, Soft and epigastric tenderness Skin: no redness, no Rash, no induration Extremities: no Pedal edema, no calf tenderness Neurologic: Grossly no focal neuro deficit. Bilaterally Equal motor strength  Data Reviewed: CBC:  Recent Labs Lab  11/17/16 1018 11/18/16 0512  WBC 6.3 4.4  NEUTROABS 4.2  --   HGB 11.6* 9.9*  HCT 35.2* 30.5*  MCV 85.9 88.2  PLT 363 797   Basic Metabolic Panel:  Recent Labs Lab 11/17/16 1018 11/17/16 2158 11/18/16 0512  NA 136 138 135  K 2.5* 2.7* 3.1*  CL 98* 105 105  CO2 26 26 23   GLUCOSE 99 87 79  BUN 8 <5* <5*  CREATININE 0.97 0.73 0.69  CALCIUM 9.2 8.1* 8.3*  MG 2.0  --   --     Liver Function Tests:  Recent Labs Lab 11/17/16 1018 11/18/16 0512  AST 27 19  ALT 22 18  ALKPHOS 41 32*  BILITOT 1.1 0.9  PROT 7.9 6.0*  ALBUMIN 4.3 3.3*   No results for input(s): LIPASE, AMYLASE in the last 168 hours. No results for input(s): AMMONIA in the last 168 hours. Coagulation Profile: No results for input(s): INR, PROTIME in the last 168 hours. Cardiac Enzymes: No results for input(s): CKTOTAL, CKMB, CKMBINDEX, TROPONINI in the last 168 hours. BNP (last 3 results) No results for input(s): PROBNP in the last 8760 hours. CBG: No results for input(s): GLUCAP in the last 168 hours. Studies: No results found.  Scheduled Meds: . enoxaparin (LOVENOX) injection  40 mg Subcutaneous Q24H  . methocarbamol  500 mg Oral TID  . metroNIDAZOLE  500 mg Oral BID  . naproxen  500 mg Oral TID WC  . pantoprazole  40 mg Oral BID AC  . sucralfate  1 g Oral TID WC & HS   Continuous Infusions: PRN Meds: acetaminophen **OR** acetaminophen, bisacodyl, gi cocktail, ondansetron **OR** ondansetron (ZOFRAN) IV, prochlorperazine, senna-docusate  Time spent: 35 minutes  Author: Berle Mull, MD [facility] Pager: [phone] 11/18/2016 5:40 PM  If 7PM-7AM, please contact night-coverage at [email or web address], password [facility]

## 2016-11-19 DIAGNOSIS — F12188 Cannabis abuse with other cannabis-induced disorder: Secondary | ICD-10-CM

## 2016-11-19 MED ORDER — PANTOPRAZOLE SODIUM 40 MG PO TBEC
40.0000 mg | DELAYED_RELEASE_TABLET | Freq: Two times a day (BID) | ORAL | 0 refills | Status: DC
Start: 2016-11-19 — End: 2019-06-22

## 2016-11-19 MED ORDER — ACETAMINOPHEN 325 MG PO TABS: 650.0000 mg | ORAL_TABLET | Freq: Four times a day (QID) | ORAL | Status: DC | PRN

## 2016-11-19 MED ORDER — METRONIDAZOLE 500 MG PO TABS: 500.0000 mg | ORAL_TABLET | Freq: Two times a day (BID) | ORAL | 0 refills | Status: DC

## 2016-11-19 MED ORDER — ALUM & MAG HYDROXIDE-SIMETH 200-200-20 MG/5ML PO SUSP: 15.0000 mL | Freq: Four times a day (QID) | ORAL | 0 refills | Status: DC | PRN

## 2016-11-19 MED ORDER — SUCRALFATE 1 GM/10ML PO SUSP: 1.0000 g | Freq: Three times a day (TID) | ORAL | 0 refills | Status: DC

## 2016-11-19 MED ORDER — METHOCARBAMOL 500 MG PO TABS: 500.0000 mg | ORAL_TABLET | Freq: Three times a day (TID) | ORAL | 0 refills | Status: DC

## 2016-11-19 NOTE — Care Management Note (Signed)
Case Management Note  Patient Details  Name: Carrie Guerra MRN: [id number] Date of Birth: December 04, 1985  Subjective/Objective:                 Provided patient with GoodRx coupon for Flagyl. No other CM needs identified.    Action/Plan:   Expected Discharge Date:  11/19/16               Expected Discharge Plan:  Home/Self Care  In-House Referral:     Discharge planning Services  CM Consult  Post Acute Care Choice:    Choice offered to:     DME Arranged:    DME Agency:     HH Arranged:    HH Agency:     Status of Service:  Completed, signed off  If discussed at [employer] of Stay Meetings, dates discussed:    Additional Comments:  Carles Collet, RN 11/19/2016, 10:17 AM

## 2016-11-19 NOTE — Progress Notes (Signed)
NURSING PROGRESS NOTE  Carrie Guerra [id number] Discharge Data: 11/19/2016 11:05 AM Attending Provider: No att. providers found PQD:IYMEBRA, No Pcp Per   [name] to be D/C'd Home per MD order.    All IV's will be discontinued and monitored for bleeding.  All belongings will be returned to patient for patient to take home.  Last Documented Vital Signs:  Blood pressure 129/76, pulse 89, temperature 98 F (36.7 C), temperature source Oral, resp. rate 18, height 5' (1.524 m), weight 76.3 kg (168 lb 3.2 oz), last menstrual period 11/09/2016, SpO2 100 %.  Joslyn Hy, MSN, RN, [employer]

## 2016-11-19 NOTE — Discharge Instructions (Signed)
Esophagitis Esophagitis is inflammation of the esophagus. The esophagus is the tube that carries food and liquids from your mouth to your stomach. Esophagitis can cause soreness or pain in the esophagus. This condition can make it difficult and painful to swallow. What are the causes? Most causes of esophagitis are not serious. Common causes of this condition include:  Gastroesophageal reflux disease (GERD). This is when stomach contents move back up into the esophagus (reflux).  Repeated vomiting.  An allergic-type reaction, especially caused by food allergies (eosinophilic esophagitis).  Injury to the esophagus by swallowing large pills with or without water, or swallowing certain types of medicines.  Swallowing (ingesting) harmful chemicals, such as household cleaning products.  Heavy alcohol use.  An infection of the esophagus.This most often occurs in people who have a weakened immune system.  Radiation or chemotherapy treatment for cancer.  Certain diseases such as sarcoidosis, Crohn disease, and scleroderma. What are the signs or symptoms? Symptoms of this condition include:  Difficult or painful swallowing.  Pain with swallowing acidic liquids, such as citrus juices.  Pain with burping.  Chest pain.  Difficulty breathing.  Nausea.  Vomiting.  Pain in the abdomen.  Weight loss.  Ulcers in the mouth.  Patches of white material in the mouth (candidiasis).  Fever.  Coughing up blood or vomiting blood.  Stool that is black, tarry, or bright red. How is this diagnosed? Your health care provider will take a medical history and perform a physical exam. You may also have other tests, including:  An endoscopy to examine your stomach and esophagus with a small camera.  A test that measures the acidity level in your esophagus.  A test that measures how much pressure is on your esophagus.  A barium swallow or modified barium swallow to show the shape, size,  and functioning of your esophagus.  Allergy tests. How is this treated? Treatment for this condition depends on the cause of your esophagitis. In some cases, steroids or other medicines may be given to help relieve your symptoms or to treat the underlying cause of your condition. You may have to make some lifestyle changes, such as:  Avoiding alcohol.  Quitting smoking.  Changing your diet.  Exercising.  Changing your sleep habits and your sleep environment. Follow these instructions at home: Take these actions to decrease your discomfort and to help avoid complications. Diet   Follow a diet as recommended by your health care provider. This may involve avoiding foods and drinks such as:  Coffee and tea (with or without caffeine).  Drinks that contain alcohol.  Energy drinks and sports drinks.  Carbonated drinks or sodas.  Chocolate and cocoa.  Peppermint and mint flavorings.  Garlic and onions.  Horseradish.  Spicy and acidic foods, including peppers, chili powder, curry powder, vinegar, hot sauces, and barbecue sauce.  Citrus fruit juices and citrus fruits, such as oranges, lemons, and limes.  Tomato-based foods, such as red sauce, chili, salsa, and pizza with red sauce.  Fried and fatty foods, such as donuts, french fries, potato chips, and high-fat dressings.  High-fat meats, such as hot dogs and fatty cuts of red and white meats, such as rib eye steak, sausage, ham, and bacon.  High-fat dairy items, such as whole milk, butter, and cream cheese.  Eat small, frequent meals instead of large meals.  Avoid drinking large amounts of liquid with your meals.  Avoid eating meals during the 2-3 hours before bedtime.  Avoid lying down right after you eat.  Do not exercise right after you eat.  Avoid foods and drinks that seem to make your symptoms worse. General instructions   Pay attention to any changes in your symptoms.  Take over-the-counter and  prescription medicines only as told by your health care provider. Do not take aspirin, ibuprofen, or other NSAIDs unless your health care provider told you to do so.  If you have trouble taking pills, use a pill splitter to decrease the size of the pill. This will decrease the chance of the pill getting stuck or injuring your esophagus on the way down. Also, drink water after you take a pill.  Do not use any tobacco products, including cigarettes, chewing tobacco, and e-cigarettes. If you need help quitting, ask your health care provider.  Wear loose-fitting clothing. Do not wear anything tight around your waist that causes pressure on your abdomen.  Raise (elevate) the head of your bed about 6 inches (15 cm).  Try to reduce your stress, such as with yoga or meditation. If you need help reducing stress, ask your health care provider.  If you are overweight, reduce your weight to an amount that is healthy for you. Ask your health care provider for guidance about a safe weight loss goal.  Keep all follow-up visits as told by your health care provider. This is important. Contact a health care provider if:  You have new symptoms.  You have unexplained weight loss.  You have difficulty swallowing, or it hurts to swallow.  You have wheezing or a persistent cough.  Your symptoms do not improve with treatment.  You have frequent heartburn for more than two weeks. Get help right away if:  You have severe pain in your arms, neck, jaw, teeth, or back.  You feel sweaty, dizzy, or light-headed.  You have chest pain or shortness of breath.  You vomit and your vomit looks like blood or coffee grounds.  Your stool is bloody or black.  You have a fever.  You cannot swallow, drink, or eat. This information is not intended to replace advice given to you by your health care provider. Make sure you discuss any questions you have with your health care provider. Document Released: 07/21/2004  Document Revised: 11/19/2015 Document Reviewed: 10/08/2014 Elsevier Interactive Patient Education  2017 [employer].

## 2016-11-21 ENCOUNTER — Emergency Department (HOSPITAL_COMMUNITY)
Admission: EM | Admit: 2016-11-21 | Discharge: 2016-11-21 | Disposition: A | Discharge status: Home / Self Care | Payer: Self-pay | Attending: Emergency Medicine | Admitting: Emergency Medicine

## 2016-11-21 ENCOUNTER — Encounter (HOSPITAL_COMMUNITY): Payer: Self-pay | Admitting: *Deleted

## 2016-11-21 DIAGNOSIS — F17210 Nicotine dependence, cigarettes, uncomplicated: Secondary | ICD-10-CM | POA: Insufficient documentation

## 2016-11-21 DIAGNOSIS — J45909 Unspecified asthma, uncomplicated: Secondary | ICD-10-CM | POA: Insufficient documentation

## 2016-11-21 DIAGNOSIS — R109 Unspecified abdominal pain: Secondary | ICD-10-CM

## 2016-11-21 DIAGNOSIS — Z79899 Other long term (current) drug therapy: Secondary | ICD-10-CM | POA: Insufficient documentation

## 2016-11-21 DIAGNOSIS — R112 Nausea with vomiting, unspecified: Secondary | ICD-10-CM | POA: Insufficient documentation

## 2016-11-21 DIAGNOSIS — R197 Diarrhea, unspecified: Secondary | ICD-10-CM | POA: Insufficient documentation

## 2016-11-21 DIAGNOSIS — G8929 Other chronic pain: Secondary | ICD-10-CM

## 2016-11-21 DIAGNOSIS — E876 Hypokalemia: Secondary | ICD-10-CM | POA: Insufficient documentation

## 2016-11-21 LAB — URINALYSIS, ROUTINE W REFLEX MICROSCOPIC
Bilirubin Urine: NEGATIVE
Glucose, UA: NEGATIVE mg/dL
Hgb urine dipstick: NEGATIVE
Ketones, ur: 5 mg/dL — AB
Leukocytes, UA: NEGATIVE
NITRITE: NEGATIVE
Protein, ur: NEGATIVE mg/dL
SPECIFIC GRAVITY, URINE: 1.018 (ref 1.005–1.030)
pH: 9 — ABNORMAL HIGH (ref 5.0–8.0)

## 2016-11-21 LAB — COMPREHENSIVE METABOLIC PANEL
ALK PHOS: 38 U/L (ref 38–126)
ALT: 23 U/L (ref 14–54)
ANION GAP: 11 (ref 5–15)
AST: 30 U/L (ref 15–41)
Albumin: 4.3 g/dL (ref 3.5–5.0)
BUN: 5 mg/dL — ABNORMAL LOW (ref 6–20)
CALCIUM: 9.4 mg/dL (ref 8.9–10.3)
CO2: 21 mmol/L — AB (ref 22–32)
Chloride: 106 mmol/L (ref 101–111)
Creatinine, Ser: 0.84 mg/dL (ref 0.44–1.00)
GFR calc non Af Amer: >60 mL/min (ref >60)
Glucose, Bld: 101 mg/dL — ABNORMAL HIGH (ref 65–99)
Potassium: 3.2 mmol/L — ABNORMAL LOW (ref 3.5–5.1)
SODIUM: 138 mmol/L (ref 135–145)
Total Bilirubin: 0.7 mg/dL (ref 0.3–1.2)
Total Protein: 7.4 g/dL (ref 6.5–8.1)

## 2016-11-21 LAB — CBC
HCT: 33.8 % — ABNORMAL LOW (ref 36.0–46.0)
HEMOGLOBIN: 10.9 g/dL — AB (ref 12.0–15.0)
MCH: 28.5 pg (ref 26.0–34.0)
MCHC: 32.2 g/dL (ref 30.0–36.0)
MCV: 88.3 fL (ref 78.0–100.0)
Platelets: 378 10*3/uL (ref 150–400)
RBC: 3.83 MIL/uL — AB (ref 3.87–5.11)
RDW: 12.9 % (ref 11.5–15.5)
WBC: 6.4 10*3/uL (ref 4.0–10.5)

## 2016-11-21 LAB — LIPASE, BLOOD: LIPASE: 28 U/L (ref 11–51)

## 2016-11-21 MED ORDER — POTASSIUM CHLORIDE 10 MEQ/100ML IV SOLN
10.0000 meq | Freq: Once | INTRAVENOUS | Status: AC
  Administered 2016-11-21: 10 meq via INTRAVENOUS
  Filled 2016-11-21: qty 100

## 2016-11-21 MED ORDER — SODIUM CHLORIDE 0.9 % IV BOLUS (SEPSIS)
1000.0000 mL | Freq: Once | INTRAVENOUS | Status: AC
  Administered 2016-11-21: 1000 mL via INTRAVENOUS

## 2016-11-21 MED ORDER — KETOROLAC TROMETHAMINE 30 MG/ML IJ SOLN
30.0000 mg | Freq: Once | INTRAMUSCULAR | Status: AC
  Administered 2016-11-21: 30 mg via INTRAVENOUS
  Filled 2016-11-21: qty 1

## 2016-11-21 MED ORDER — PROMETHAZINE HCL 25 MG PO TABS
25.0000 mg | ORAL_TABLET | ORAL | Status: AC
  Administered 2016-11-21: 25 mg via ORAL
  Filled 2016-11-21: qty 1

## 2016-11-21 MED ORDER — PANTOPRAZOLE SODIUM 40 MG IV SOLR
40.0000 mg | Freq: Once | INTRAVENOUS | Status: AC
  Administered 2016-11-21: 40 mg via INTRAVENOUS
  Filled 2016-11-21: qty 40

## 2016-11-21 MED ORDER — MORPHINE SULFATE (PF) 4 MG/ML IV SOLN
4.0000 mg | Freq: Once | INTRAVENOUS | Status: AC
  Administered 2016-11-21: 4 mg via INTRAVENOUS
  Filled 2016-11-21: qty 1

## 2016-11-21 MED ORDER — PANTOPRAZOLE SODIUM 40 MG PO TBEC
40.0000 mg | DELAYED_RELEASE_TABLET | Freq: Once | ORAL | Status: AC
  Administered 2016-11-21: 40 mg via ORAL
  Filled 2016-11-21: qty 1

## 2016-11-21 MED ORDER — PROMETHAZINE HCL 25 MG/ML IJ SOLN
25.0000 mg | Freq: Once | INTRAMUSCULAR | Status: AC
  Administered 2016-11-21: 25 mg via INTRAVENOUS
  Filled 2016-11-21: qty 1

## 2016-11-21 MED ORDER — ONDANSETRON HCL 4 MG/2ML IJ SOLN: 4.0000 mg | Freq: Once | INTRAMUSCULAR | Status: DC

## 2016-11-21 NOTE — ED Triage Notes (Signed)
Pt reports being admitted here for 2 days and released from the hospital Saturday, pt reports being treated for gastritis & pain related to fibroids, pt tearful & yelling in triage, pt reports continuous vomiting and diarrhea onset today, A&O x4

## 2016-11-21 NOTE — ED Provider Notes (Signed)
[facility] DEPT Provider Note   CSN: [id number] Arrival date & time: 11/21/16  1811     History   Chief Complaint Chief Complaint  Patient presents with  . Abdominal Pain    HPI Carrie Guerra is a 31 y.o. female.  31 year old female with a history of cannabis hyperemesis, esophagitis, and uterine fibroids presents to the emergency department for evaluation of abdominal pain. She states that her pain is hurting "all over" she describes it as sharp. She states that her pain is similar to prior exacerbations of abdominal pain, but "today feels worse". She reports nausea and vomiting. This started this morning. She has had approximately 10 episodes of emesis. She has had one episode of frank hematemesis as well. Patient complaining of diarrhea. She has had no melena or hematochezia. She has not taken any home medications today as she did not have these prescriptions filled following discharge from the hospital on 11/18/2016. She has not had any fevers, urinary symptoms. No history of abdominal surgeries. She denies smoking marijuana since discharge.   The history is provided by the patient. No language interpreter was used.  Abdominal Pain      Past Medical History:  Diagnosis Date  . Asthma   . Cannabis hyperemesis syndrome concurrent with and due to cannabis abuse ([place]) 11/09/2016  . Fibroids     Patient Active Problem List   Diagnosis Date Noted  . Vomiting 11/17/2016  . Hypokalemia 11/17/2016  . Cannabis hyperemesis syndrome concurrent with and due to cannabis abuse ([place]) 11/09/2016    Past Surgical History:  Procedure Laterality Date  . NO PAST SURGERIES      OB History    Gravida Para Term Preterm AB Living   0 0 0 0 0 0   SAB TAB Ectopic Multiple Live Births   0 0 0 0 0       Home Medications    Prior to Admission medications   Medication Sig Start Date End Date Taking? Authorizing Provider  ondansetron (ZOFRAN ODT) 8 MG disintegrating tablet Take 1  tablet (8 mg total) by mouth every 8 (eight) hours as needed for nausea or vomiting. 11/09/16  Yes Laury Deep, CNM  prochlorperazine (COMPAZINE) 25 MG suppository Place 1 suppository (25 mg total) rectally every 12 (twelve) hours as needed for nausea or vomiting. 11/08/16  Yes Lorin Glass, PA-C  acetaminophen (TYLENOL) 325 MG tablet Take 2 tablets (650 mg total) by mouth every 6 (six) hours as needed for mild pain (or Fever >/= 101). 11/19/16   Lavina Hamman, MD  alum & mag hydroxide-simeth (MAALOX/MYLANTA) 200-200-20 MG/5ML suspension Take 15 mLs by mouth every 6 (six) hours as needed for indigestion or heartburn. 11/19/16   Lavina Hamman, MD  meclizine (ANTIVERT) 25 MG tablet Take 50 mg by mouth daily as needed for nausea.    [provider]  methocarbamol (ROBAXIN) 500 MG tablet Take 1 tablet (500 mg total) by mouth 3 (three) times daily. 11/19/16   Lavina Hamman, MD  metroNIDAZOLE (FLAGYL) 500 MG tablet Take 1 tablet (500 mg total) by mouth 2 (two) times daily. 11/19/16   Lavina Hamman, MD  pantoprazole (PROTONIX) 40 MG tablet Take 1 tablet (40 mg total) by mouth 2 (two) times daily before a meal. 11/19/16 12/02/16  Lavina Hamman, MD  sucralfate (CARAFATE) 1 GM/10ML suspension Take 10 mLs (1 g total) by mouth 4 (four) times daily -  with meals and at bedtime. 11/19/16   Posey Pronto,  Josetta Huddle, MD    Family History Family History  Problem Relation Age of Onset  . Diabetes Mother   . Diabetes Other     Social History Social History  Substance Use Topics  . Smoking status: Former Smoker    Types: Cigarettes    Quit date: 10/30/2016  . Smokeless tobacco: Never Used     Comment: 11/17/2016 "someday smoker when I did smoke"  . Alcohol use Yes     Comment: 11/17/2016 "might drink 2-3 days/month"     Allergies   Shrimp [shellfish allergy] and Cabbage   Review of Systems Review of Systems  Gastrointestinal: Positive for abdominal pain.  Ten systems reviewed and are  negative for acute change, except as noted in the HPI.    Physical Exam Updated Vital Signs BP 104/66   Pulse 97   Temp 98.3 F (36.8 C) (Oral)   Resp 16   LMP 11/09/2016 (Exact Date)   SpO2 100%   Physical Exam  Constitutional: She is oriented to person, place, and time. She appears well-developed and well-nourished. No distress.  Nontoxic and in NAD. Rocking in bed during initial encounter, but distractible during exam.   HENT:  Head: Normocephalic and atraumatic.  Eyes: Conjunctivae and EOM are normal. No scleral icterus.  Neck: Normal range of motion.  Cardiovascular: Normal rate, regular rhythm and intact distal pulses.   Pulmonary/Chest: Effort normal. No respiratory distress. She has no wheezes. She has no rales.  Respirations even and unlabored. Lungs CTAB.  Abdominal: Soft. She exhibits no mass. There is no guarding.  Soft, obese abdomen. No focal TTP. No peritoneal signs or guarding.  Musculoskeletal: Normal range of motion.  Neurological: She is alert and oriented to person, place, and time. She exhibits normal muscle tone. Coordination normal.  GCS 15. Patient moving all extremities.  Skin: Skin is warm and dry. No rash noted. She is not diaphoretic. No erythema. No pallor.  Psychiatric: She has a normal mood and affect. Her behavior is normal.  Nursing note and vitals reviewed.    ED Treatments / Results  Labs (all labs ordered are listed, but only abnormal results are displayed) Labs Reviewed  COMPREHENSIVE METABOLIC PANEL - Abnormal; Notable for the following:       Result Value   Potassium 3.2 (*)    CO2 21 (*)    Glucose, Bld 101 (*)    BUN 5 (*)    All other components within normal limits  CBC - Abnormal; Notable for the following:    RBC 3.83 (*)    Hemoglobin 10.9 (*)    HCT 33.8 (*)    All other components within normal limits  URINALYSIS, ROUTINE W REFLEX MICROSCOPIC - Abnormal; Notable for the following:    pH 9.0 (*)    Ketones, ur 5 (*)      All other components within normal limits  LIPASE, BLOOD    EKG  EKG Interpretation None       Radiology No results found.  Procedures Procedures (including critical care time)  Medications Ordered in ED Medications  sodium chloride 0.9 % bolus 1,000 mL (0 mLs Intravenous Stopped 11/21/16 2200)  morphine 4 MG/ML injection 4 mg (4 mg Intravenous Given 11/21/16 1945)  promethazine (PHENERGAN) injection 25 mg (25 mg Intravenous Given 11/21/16 1945)  pantoprazole (PROTONIX) injection 40 mg (40 mg Intravenous Given 11/21/16 2050)  ketorolac (TORADOL) 30 MG/ML injection 30 mg (30 mg Intravenous Given 11/21/16 2048)  potassium chloride 10 mEq in  100 mL IVPB (0 mEq Intravenous Stopped 11/21/16 2154)  promethazine (PHENERGAN) tablet 25 mg (25 mg Oral Given 11/21/16 2332)  pantoprazole (PROTONIX) EC tablet 40 mg (40 mg Oral Given 11/21/16 2332)     Initial Impression / Assessment and Plan / ED Course  I have reviewed the triage vital signs and the nursing notes.  Pertinent labs & imaging results that were available during my care of the patient were reviewed by me and considered in my medical decision making (see chart for details).     31 year old female presents to the emergency department for evaluation of acute on chronic abdominal pain with nausea, vomiting, and diarrhea. Patient has been seen in the emergency department numerous times for similar complaints. She was recently admitted for this on 11/17/2016. She states that she has not been able to get her prescriptions filled for outpatient management. She is planning to retrieve these medications tomorrow.  Patient without signs of acute surgical abdomen. Laboratory workup is at baseline. IV potassium given for hypokalemia. On repeat assessment, patient states that she is feeling much better. She has ambulated to the bathroom without difficulty. No complaints of nausea or vomiting. Patient instructed to continue with outpatient  medications. I have advised that she avoid smoking marijuana as this has been thought to induce her episodes of vomiting. Return precautions discussed and provided. Patient discharged in stable condition with no unaddressed concerns.   Final Clinical Impressions(s) / ED Diagnoses   Final diagnoses:  Chronic abdominal pain  Nausea vomiting and diarrhea  Hypokalemia    New Prescriptions New Prescriptions   No medications on file     Antonietta Breach, Hershal Coria 11/21/16 2341    Drenda Freeze, MD 11/25/16 2258

## 2016-11-21 NOTE — ED Notes (Signed)
Pt departed in NAD, refused use of wheelchair.  

## 2016-11-22 ENCOUNTER — Emergency Department
Admission: EM | Admit: 2016-11-22 | Discharge: 2016-11-22 | Disposition: A | Discharge status: Home / Self Care | Payer: Self-pay | Attending: Emergency Medicine | Admitting: Emergency Medicine

## 2016-11-22 ENCOUNTER — Encounter: Payer: Self-pay | Admitting: Emergency Medicine

## 2016-11-22 DIAGNOSIS — R197 Diarrhea, unspecified: Secondary | ICD-10-CM | POA: Insufficient documentation

## 2016-11-22 DIAGNOSIS — R1084 Generalized abdominal pain: Secondary | ICD-10-CM | POA: Insufficient documentation

## 2016-11-22 DIAGNOSIS — J45909 Unspecified asthma, uncomplicated: Secondary | ICD-10-CM | POA: Insufficient documentation

## 2016-11-22 DIAGNOSIS — R112 Nausea with vomiting, unspecified: Secondary | ICD-10-CM | POA: Insufficient documentation

## 2016-11-22 DIAGNOSIS — Z87891 Personal history of nicotine dependence: Secondary | ICD-10-CM | POA: Insufficient documentation

## 2016-11-22 LAB — COMPREHENSIVE METABOLIC PANEL
ALBUMIN: 3.9 g/dL (ref 3.5–5.0)
ALT: 26 U/L (ref 14–54)
AST: 36 U/L (ref 15–41)
Alkaline Phosphatase: 38 U/L (ref 38–126)
Anion gap: 6 (ref 5–15)
BILIRUBIN TOTAL: 0.9 mg/dL (ref 0.3–1.2)
BUN: 6 mg/dL (ref 6–20)
CHLORIDE: 106 mmol/L (ref 101–111)
CO2: 26 mmol/L (ref 22–32)
Calcium: 8.9 mg/dL (ref 8.9–10.3)
Creatinine, Ser: 0.77 mg/dL (ref 0.44–1.00)
GFR calc Af Amer: >60 mL/min (ref >60)
GFR calc non Af Amer: >60 mL/min (ref >60)
GLUCOSE: 107 mg/dL — AB (ref 65–99)
POTASSIUM: 3 mmol/L — AB (ref 3.5–5.1)
Sodium: 138 mmol/L (ref 135–145)
TOTAL PROTEIN: 7 g/dL (ref 6.5–8.1)

## 2016-11-22 LAB — CBC
HEMATOCRIT: 30.5 % — AB (ref 35.0–47.0)
Hemoglobin: 10.3 g/dL — ABNORMAL LOW (ref 12.0–16.0)
MCH: 29.8 pg (ref 26.0–34.0)
MCHC: 33.8 g/dL (ref 32.0–36.0)
MCV: 88.2 fL (ref 80.0–100.0)
Platelets: 293 10*3/uL (ref 150–440)
RBC: 3.46 MIL/uL — ABNORMAL LOW (ref 3.80–5.20)
RDW: 13.3 % (ref 11.5–14.5)
WBC: 4.4 10*3/uL (ref 3.6–11.0)

## 2016-11-22 LAB — LIPASE, BLOOD: Lipase: 29 U/L (ref 11–51)

## 2016-11-22 LAB — GC/CHLAMYDIA PROBE AMP (~~LOC~~) NOT AT ARMC
CHLAMYDIA, DNA PROBE: NEGATIVE
Neisseria Gonorrhea: NEGATIVE

## 2016-11-22 NOTE — Discharge Summary (Signed)
[facility] Discharge Summary   Patient: Carrie Guerra IRC:[id number]   PCP: Patient, No Pcp Per DOB: Feb 18, 1986   Date of admission: 11/17/2016   Date of discharge: 11/19/2016    Discharge Diagnoses:  Active Problems:   Cannabis hyperemesis syndrome concurrent with and due to cannabis abuse (HCC)   Vomiting   Hypokalemia  Admitted From: home Disposition:  home  Recommendations for Outpatient Follow-up:  1. Please follow up with PCP in 1 week  Follow-up [place]. Call.   Why:  to establish PCP Contact information: [place] [address] [phone]         Diet recommendation: regular diet  Activity: The patient is advised to gradually reintroduce usual activities.  Discharge Condition: good  Code Status: full code  History of present illness: As per the H and P dictated on admission, "Carrie Guerra is a 31 y.o. female with medical history significant for cannabis hyperemesis, last marijuana taken 5 days ago, history of fibroids, recently at the emergency department with same, no presenting with lower pelvic pain, right greater than left, acute, accompanied by white frothy vaginal discharge. Patient denies any fever or chills or night sweats. She tried pain medications, and antiemetics, without improvement. The patient has vomited several times prior to presenting to the emergency department. Denies shortness of breath and chest pain. Denies any recent long distance travel. Denies any sick contacts. She has my diarrhea after taking enema but no bowel movements were seen at the emergency department. She denies any other areas of [place]. No confusion is reported. No leg swelling."  Hospital Course:  Summary of her active problems in the hospital is as following. 1. Nausea and vomiting. 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 Esophagitis seen on CT scan. Patient with complains of  nausea and vomiting, poor oral intake. CT scan on 5/15 was showing evidence of esophageal thickening. We'll start the patient on Protonix every 12 hours, Carafate, GI cocktail. 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 Tolerated oral diet  2. Pelvic pain. Ultrasound negative for any acute abnormality or ovarian torsion. CT scan 11/08/2016 unremarkable as well. Urine unremarkable, wet prep unremarkable. Started on IV Flagyl, with severe pain and positive clue cell I would finish Flagyl course of 7 days.  3. Hypokalemia. Severe. Resolved now. No vomiting and improved oral intake, so hopefully would remain stable on discharge.   4. Anemia.  Likely dilutional.  All other chronic medical condition were stable during the hospitalization.  Patient was ambulatory without any assistance. On the day of the discharge the patient's vitals were stable, and no other acute medical condition were reported by patient. the patient was felt safe to be discharge at home with family.  Procedures and Results:  none   Consultations:  none  DISCHARGE MEDICATION: Discharge Medication List as of 11/19/2016  9:42 AM    START taking these medications   Details  acetaminophen (TYLENOL) 325 MG tablet Take 2 tablets (650 mg total) by mouth every 6 (six) hours as needed for mild pain (or Fever >/= 101)., Starting Sat 11/19/2016, No Print    alum & mag hydroxide-simeth (MAALOX/MYLANTA) 200-200-20 MG/5ML suspension Take 15 mLs by mouth every 6 (six) hours as needed for indigestion or heartburn., Starting Sat 11/19/2016, Normal    metroNIDAZOLE (FLAGYL) 500 MG tablet Take 1 tablet (500 mg total) by mouth 2 (two) times daily., Starting Sat 11/19/2016, Normal  pantoprazole (PROTONIX) 40 MG tablet Take 1 tablet (40 mg total) by mouth 2 (two) times daily before a meal., Starting Sat 11/19/2016, Until Fri 12/02/2016, Normal      CONTINUE these medications which have  CHANGED   Details  methocarbamol (ROBAXIN) 500 MG tablet Take 1 tablet (500 mg total) by mouth 3 (three) times daily., Starting Sat 11/19/2016, Normal    sucralfate (CARAFATE) 1 GM/10ML suspension Take 10 mLs (1 g total) by mouth 4 (four) times daily -  with meals and at bedtime., Starting Sat 11/19/2016, Print      CONTINUE these medications which have NOT CHANGED   Details  meclizine (ANTIVERT) 25 MG tablet Take 50 mg by mouth daily as needed for nausea., Historical Med    ondansetron (ZOFRAN ODT) 8 MG disintegrating tablet Take 1 tablet (8 mg total) by mouth every 8 (eight) hours as needed for nausea or vomiting., Starting Wed 11/09/2016, Normal    prochlorperazine (COMPAZINE) 25 MG suppository Place 1 suppository (25 mg total) rectally every 12 (twelve) hours as needed for nausea or vomiting., Starting Tue 11/08/2016, Print    IBU 800 MG tablet Take 800 mg by mouth every 8 (eight) hours., Starting Fri 11/11/2016, Historical Med      STOP taking these medications     oxyCODONE-acetaminophen (PERCOCET/ROXICET) 5-325 MG tablet        Allergies  Allergen Reactions  . Shrimp [Shellfish Allergy] Anaphylaxis  . Cabbage Hives    Pt had to be taken to ER for this reaction    Discharge Instructions    Diet general    Complete by:  As directed    Increase activity slowly    Complete by:  As directed      Discharge Exam: Filed Weights   11/17/16 1632 11/18/16 0534 11/19/16 6644  Weight: 71.7 kg (158 lb) 74.5 kg (164 lb 3.2 oz) 76.3 kg (168 lb 3.2 oz)   Vitals:   11/18/16 2110 11/19/16 0628  BP: 119/71 129/76  Pulse: 77 89  Resp: 18 18  Temp: 98.4 F (36.9 C) 98 F (36.7 C)   General: Appear in no distress, no Rash; Oral Mucosa moist. Cardiovascular: S1 and S2 Present, no Murmur, no JVD Respiratory: Bilateral Air entry present and Clear to Auscultation, no Crackles, no wheezes Abdomen: Bowel Sound present, Soft and no tenderness Extremities: no Pedal edema, no calf  tenderness Neurology: Grossly no focal neuro deficit.  The results of significant diagnostics from this hospitalization (including imaging, microbiology, ancillary and laboratory) are listed below for reference.    Significant Diagnostic Studies: Dg Chest 2 View  Result Date: 11/08/2016 CLINICAL DATA:  Left mid chest and abdominal pain for 3 days EXAM: CHEST  2 VIEW COMPARISON:  10/05/2016 FINDINGS: Heart and mediastinal contours are within normal limits. No focal opacities or effusions. No acute bony abnormality. IMPRESSION: No active cardiopulmonary disease. Electronically Signed   By: Rolm Baptise M.D.   On: 11/08/2016 08:49   US Transvaginal Non-ob  Result Date: 11/17/2016 CLINICAL DATA:  Pelvic pain for 14 days.  No known injury. EXAM: TRANSABDOMINAL AND TRANSVAGINAL ULTRASOUND OF PELVIS DOPPLER ULTRASOUND OF OVARIES TECHNIQUE: Both transabdominal and transvaginal ultrasound examinations of the pelvis were performed. Transabdominal technique was performed for global imaging of the pelvis including uterus, ovaries, adnexal regions, and pelvic cul-de-sac. It was necessary to proceed with endovaginal exam following the transabdominal exam to visualize the endometrium and adnexa. Color and duplex Doppler ultrasound was utilized to evaluate blood flow to the  ovaries. COMPARISON:  CT abdomen and pelvis 11/14/2016. Pelvic ultrasound 10/12/2016. FINDINGS: Uterus Measurements: 7.7 x 4.5 x 5.4 cm. Three fibroids are identified. The largest measures 3.6 cm in diameter with a second measuring 3.5 cm and a third measuring 1.9 cm. Endometrium Thickness: 11 mm.  No focal abnormality visualized. Right ovary Measurements: 2.2 x 3.2 x 2.0 cm. Normal appearance/no adnexal mass. Left ovary Measurements: 2.3 x 1.7 x 3.7 cm. Normal appearance/no adnexal mass. Pulsed Doppler evaluation of both ovaries demonstrates normal low-resistance arterial and venous waveforms. Other findings No abnormal free fluid. IMPRESSION: No  acute abnormality.  Negative for ovarian torsion. 3 uterine fibroids as seen on the prior ultrasound. Electronically Signed   By: Inge Rise M.D.   On: 11/17/2016 13:20   US Pelvis Complete  Result Date: 11/17/2016 CLINICAL DATA:  Pelvic pain for 14 days.  No known injury. EXAM: TRANSABDOMINAL AND TRANSVAGINAL ULTRASOUND OF PELVIS DOPPLER ULTRASOUND OF OVARIES TECHNIQUE: Both transabdominal and transvaginal ultrasound examinations of the pelvis were performed. Transabdominal technique was performed for global imaging of the pelvis including uterus, ovaries, adnexal regions, and pelvic cul-de-sac. It was necessary to proceed with endovaginal exam following the transabdominal exam to visualize the endometrium and adnexa. Color and duplex Doppler ultrasound was utilized to evaluate blood flow to the ovaries. COMPARISON:  CT abdomen and pelvis 11/14/2016. Pelvic ultrasound 10/12/2016. FINDINGS: Uterus Measurements: 7.7 x 4.5 x 5.4 cm. Three fibroids are identified. The largest measures 3.6 cm in diameter with a second measuring 3.5 cm and a third measuring 1.9 cm. Endometrium Thickness: 11 mm.  No focal abnormality visualized. Right ovary Measurements: 2.2 x 3.2 x 2.0 cm. Normal appearance/no adnexal mass. Left ovary Measurements: 2.3 x 1.7 x 3.7 cm. Normal appearance/no adnexal mass. Pulsed Doppler evaluation of both ovaries demonstrates normal low-resistance arterial and venous waveforms. Other findings No abnormal free fluid. IMPRESSION: No acute abnormality.  Negative for ovarian torsion. 3 uterine fibroids as seen on the prior ultrasound. Electronically Signed   By: Inge Rise M.D.   On: 11/17/2016 13:20   Ct Abdomen Pelvis W Contrast  Result Date: 11/08/2016 CLINICAL DATA:  Left upper chest pain, left lower quadrant pain. EXAM: CT ABDOMEN AND PELVIS WITH CONTRAST TECHNIQUE: Multidetector CT imaging of the abdomen and pelvis was performed using the standard protocol following bolus  administration of intravenous contrast. CONTRAST:  168mL ISOVUE-300 IOPAMIDOL (ISOVUE-300) INJECTION 61% COMPARISON:  CT 10/09/2016 FINDINGS: Lower chest: Circumferential wall thickening in the visualized lower esophagus. Lungs are clear. No effusions. Hepatobiliary: No focal hepatic abnormality. Gallbladder unremarkable. Pancreas: No focal abnormality or ductal dilatation. Spleen: No focal abnormality.  Normal size. Adrenals/Urinary Tract: No adrenal abnormality. No focal renal abnormality. No stones or hydronephrosis. Urinary bladder is unremarkable. Stomach/Bowel: Appendix is normal. Stomach, large and small bowel grossly unremarkable. Vascular/Lymphatic: No evidence of aneurysm or adenopathy. Reproductive: Enlarged fibroid uterus.  No adnexal masses. Other: Trace free fluid in the pelvis.  No free air. Musculoskeletal: No acute bony abnormality. IMPRESSION: Circumferential wall thickening in the distal esophagus compatible with esophagitis. Fibroid uterus. Normal appendix. No acute findings in the abdomen or pelvis. Electronically Signed   By: Rolm Baptise M.D.   On: 11/08/2016 09:44   [place] Art/ven Flow Abd Pelv Doppler  Result Date: 11/17/2016 CLINICAL DATA:  Pelvic pain for 14 days.  No known injury. EXAM: TRANSABDOMINAL AND TRANSVAGINAL ULTRASOUND OF PELVIS DOPPLER ULTRASOUND OF OVARIES TECHNIQUE: Both transabdominal and transvaginal ultrasound examinations of the pelvis were performed. Transabdominal technique was performed for  global imaging of the pelvis including uterus, ovaries, adnexal regions, and pelvic cul-de-sac. It was necessary to proceed with endovaginal exam following the transabdominal exam to visualize the endometrium and adnexa. Color and duplex Doppler ultrasound was utilized to evaluate blood flow to the ovaries. COMPARISON:  CT abdomen and pelvis 11/14/2016. Pelvic ultrasound 10/12/2016. FINDINGS: Uterus Measurements: 7.7 x 4.5 x 5.4 cm. Three fibroids are identified. The largest  measures 3.6 cm in diameter with a second measuring 3.5 cm and a third measuring 1.9 cm. Endometrium Thickness: 11 mm.  No focal abnormality visualized. Right ovary Measurements: 2.2 x 3.2 x 2.0 cm. Normal appearance/no adnexal mass. Left ovary Measurements: 2.3 x 1.7 x 3.7 cm. Normal appearance/no adnexal mass. Pulsed Doppler evaluation of both ovaries demonstrates normal low-resistance arterial and venous waveforms. Other findings No abnormal free fluid. IMPRESSION: No acute abnormality.  Negative for ovarian torsion. 3 uterine fibroids as seen on the prior ultrasound. Electronically Signed   By: Inge Rise M.D.   On: 11/17/2016 13:20    Microbiology: Recent Results (from the past 240 hour(s))  Wet prep, genital     Status: Abnormal   Collection Time: 11/17/16 10:47 AM  Result Value Ref Range Status   Yeast Wet Prep HPF POC NONE SEEN NONE SEEN Final   Trich, Wet Prep NONE SEEN NONE SEEN Final   Clue Cells Wet Prep HPF POC FEW (A) NONE SEEN Final   WBC, Wet Prep HPF POC NONE SEEN NONE SEEN Final   Sperm NONE SEEN  Final  Urine culture     Status: Abnormal   Collection Time: 11/17/16 10:47 AM  Result Value Ref Range Status   Specimen Description URINE, RANDOM  Final   Special Requests NONE  Final   Culture <10,000 COLONIES/mL INSIGNIFICANT GROWTH (A)  Final   Report Status 11/18/2016 FINAL  Final     Labs: CBC:  Recent Labs Lab 11/17/16 1018 11/18/16 0512 11/21/16 1829  WBC 6.3 4.4 6.4  NEUTROABS 4.2  --   --   HGB 11.6* 9.9* 10.9*  HCT 35.2* 30.5* 33.8*  MCV 85.9 88.2 88.3  PLT 363 310 161   Basic Metabolic Panel:  Recent Labs Lab 11/17/16 1018 11/17/16 2158 11/18/16 0512 11/21/16 1829  NA 136 138 135 138  K 2.5* 2.7* 3.1* 3.2*  CL 98* 105 105 106  CO2 26 26 23  21*  GLUCOSE 99 87 79 101*  BUN 8 <5* <5* 5*  CREATININE 0.97 0.73 0.69 0.84  CALCIUM 9.2 8.1* 8.3* 9.4  MG 2.0  --   --   --    Liver Function Tests:  Recent Labs Lab 11/17/16 1018  11/18/16 0512 11/21/16 1829  AST 27 19 30   ALT 22 18 23   ALKPHOS 41 32* 38  BILITOT 1.1 0.9 0.7  PROT 7.9 6.0* 7.4  ALBUMIN 4.3 3.3* 4.3    Recent Labs Lab 11/21/16 1829  LIPASE 28   Time spent: 35 minutes  Signed:  Leiyah Maultsby  [facility] 11/19/2016 , 7:47 AM

## 2016-11-22 NOTE — ED Triage Notes (Signed)
C/O generalized abdominal pain, Nausea, Vomiting, Diarrhea. Onset of symptoms today at 1300.

## 2016-11-22 NOTE — ED Notes (Signed)
Recently treated at [facility] for inflammation of stomach and fibroids.  Seen again through [facility] ED and released at 2200.

## 2016-11-22 NOTE — ED Triage Notes (Signed)
Pt here from home via [facility] with c/o abdominal pain, N/V/D. Pt given 4mg  zofran and 12mcg fentanyl.

## 2017-04-04 ENCOUNTER — Emergency Department (HOSPITAL_COMMUNITY)
Admission: EM | Admit: 2017-04-04 | Discharge: 2017-04-04 | Disposition: A | Discharge status: Home / Self Care | Payer: Self-pay | Attending: Emergency Medicine | Admitting: Emergency Medicine

## 2017-04-04 ENCOUNTER — Encounter (HOSPITAL_COMMUNITY): Payer: Self-pay | Admitting: *Deleted

## 2017-04-04 DIAGNOSIS — H1032 Unspecified acute conjunctivitis, left eye: Secondary | ICD-10-CM | POA: Insufficient documentation

## 2017-04-04 DIAGNOSIS — Z79899 Other long term (current) drug therapy: Secondary | ICD-10-CM | POA: Insufficient documentation

## 2017-04-04 DIAGNOSIS — Z87891 Personal history of nicotine dependence: Secondary | ICD-10-CM | POA: Insufficient documentation

## 2017-04-04 DIAGNOSIS — J45909 Unspecified asthma, uncomplicated: Secondary | ICD-10-CM | POA: Insufficient documentation

## 2017-04-04 MED ORDER — SULFACETAMIDE SODIUM 10 % OP SOLN: 1.0000 [drp] | OPHTHALMIC | 0 refills | Status: DC

## 2017-04-04 MED ORDER — KETOROLAC TROMETHAMINE 15 MG/ML IJ SOLN
15.0000 mg | Freq: Once | INTRAMUSCULAR | Status: AC
  Administered 2017-04-04: 15 mg via INTRAMUSCULAR
  Filled 2017-04-04: qty 1

## 2017-04-04 NOTE — Discharge Instructions (Signed)
Please read attached information regarding your condition. Take sulfacetamide drops in left eye as directed. Follow-up with PCP or [place] and wellness for further evaluation and management of your high blood pressure. Take ibuprofen or Tylenol as needed for headache and pain. Return to ED for worsening symptoms, increased swelling, increased drainage from the eye, fevers, trouble opening eye or pain with eye movements.

## 2017-04-04 NOTE — ED Provider Notes (Signed)
[facility] DEPT Provider Note   CSN: [id number] Arrival date & time: 04/04/17  0701     History   Chief Complaint Chief Complaint  Patient presents with  . Eye Problem    HPI Carrie Guerra is a 31 y.o. female.  HPI  Patient, with a past medical history of asthma, presents for evaluation of left eye swelling and discharge since yesterday. She woke up yesterday with her left eyelids matted together. She then had to use a warm compress to be able to open the eye. She also reports associated migraine. She does have a history of migraines and states that this feels similar. She did have improvement in symptoms with the exception which she took yesterday. She denies any fever, cough, trouble breathing, trouble swallowing, rhinorrhea or sick contacts with similar symptoms.  Past Medical History:  Diagnosis Date  . Asthma   . 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 Fibroids     Patient Active Problem List   Diagnosis Date Noted  . Vomiting 11/17/2016  . Hypokalemia 11/17/2016  . Cannabis hyperemesis syndrome concurrent with and due to cannabis abuse ([place]) 11/09/2016    Past Surgical History:  Procedure Laterality Date  . NO PAST SURGERIES      OB History    Gravida Para Term Preterm AB Living   0 0 0 0 0 0   SAB TAB Ectopic Multiple Live Births   0 0 0 0 0       Home Medications    Prior to Admission medications   Medication Sig Start Date End Date Taking? Authorizing Provider  aspirin-acetaminophen-caffeine (EXCEDRIN MIGRAINE) [phone] MG tablet Take 2 tablets by mouth once as needed for headache or migraine.   Yes [provider]  acetaminophen (TYLENOL) 325 MG tablet Take 2 tablets (650 mg total) by mouth every 6 (six) hours as needed for mild pain (or Fever >/= 101). 11/19/16   Lavina Hamman, MD  alum & mag hydroxide-simeth (MAALOX/MYLANTA) 200-200-20 MG/5ML suspension Take 15 mLs by mouth every 6  (six) hours as needed for indigestion or heartburn. 11/19/16   Lavina Hamman, MD  methocarbamol (ROBAXIN) 500 MG tablet Take 1 tablet (500 mg total) by mouth 3 (three) times daily. Patient not taking: Reported on 04/04/2017 11/19/16   Lavina Hamman, MD  metroNIDAZOLE (FLAGYL) 500 MG tablet Take 1 tablet (500 mg total) by mouth 2 (two) times daily. Patient not taking: Reported on 04/04/2017 11/19/16   Lavina Hamman, MD  ondansetron (ZOFRAN ODT) 8 MG disintegrating tablet Take 1 tablet (8 mg total) by mouth every 8 (eight) hours as needed for nausea or vomiting. Patient not taking: Reported on 04/04/2017 11/09/16   Laury Deep, CNM  pantoprazole (PROTONIX) 40 MG tablet Take 1 tablet (40 mg total) by mouth 2 (two) times daily before a meal. 11/19/16 12/02/16  Lavina Hamman, MD  prochlorperazine (COMPAZINE) 25 MG suppository Place 1 suppository (25 mg total) rectally every 12 (twelve) hours as needed for nausea or vomiting. Patient not taking: Reported on 04/04/2017 11/08/16   Lorin Glass, PA-C  sucralfate (CARAFATE) 1 GM/10ML suspension Take 10 mLs (1 g total) by mouth 4 (four) times daily -  with meals and at bedtime. Patient not taking: Reported on 04/04/2017 11/19/16   Lavina Hamman, MD  sulfacetamide (BLEPH-10) 10 % ophthalmic solution Place 1-2 drops into the left eye every 4 (four) hours. 04/04/17   Delia Heady, PA-C  Family History Family History  Problem Relation Age of Onset  . Diabetes Mother   . Diabetes Other     Social History Social History  Substance Use Topics  . Smoking status: Former Smoker    Types: Cigarettes    Quit date: 10/30/2016  . Smokeless tobacco: Never Used     Comment: 11/17/2016 "someday smoker when I did smoke"  . Alcohol use Yes     Comment: 11/17/2016 "might drink 2-3 days/month"     Allergies   Shrimp [shellfish allergy] and Cabbage   Review of Systems Review of Systems  Constitutional: Negative for chills and fever.  HENT: Negative for  congestion, ear discharge, ear pain, facial swelling, hearing loss, rhinorrhea, sinus pain, sinus pressure and sore throat.   Eyes: Positive for photophobia, discharge, redness and itching. Negative for pain and visual disturbance.  Respiratory: Negative for cough.   Skin: Negative for rash.  Neurological: Positive for headaches.     Physical Exam Updated Vital Signs BP (!) 145/106 (BP Location: Right Arm)   Pulse 79   Temp 98.4 F (36.9 C) (Oral)   Resp 16   LMP 03/21/2017   SpO2 100%   Physical Exam  Constitutional: She is oriented to person, place, and time. She appears well-developed and well-nourished. No distress.  HENT:  Head: Normocephalic and atraumatic.  Eyes: Pupils are equal, round, and reactive to light. EOM are normal. Right eye exhibits no discharge. Left eye exhibits discharge. Left conjunctiva is injected. No scleral icterus.  Mildly edematous left upper eyelid. Pupils equal and reactive. Left conjunctiva with mild injection. No pain with extraocular motions. Discharge noted at the corner of the left thigh.  Neck: Normal range of motion.  Pulmonary/Chest: Effort normal. No respiratory distress.  Neurological: She is alert and oriented to person, place, and time. No cranial nerve deficit or sensory deficit. She exhibits normal muscle tone. Coordination normal.  Pupils reactive. No facial asymmetry noted. Cranial nerves appear grossly intact. Sensation intact to light touch on face, BUE and BLE.  Skin: No rash noted. She is not diaphoretic.  Psychiatric: She has a normal mood and affect.  Nursing note and vitals reviewed.    ED Treatments / Results  Labs (all labs ordered are listed, but only abnormal results are displayed) Labs Reviewed - No data to display  EKG  EKG Interpretation None       Radiology No results found.  Procedures Procedures (including critical care time)  Medications Ordered in ED Medications  ketorolac (TORADOL) 15 MG/ML  injection 15 mg (15 mg Intramuscular Given 04/04/17 0952)     Initial Impression / Assessment and Plan / ED Course  I have reviewed the triage vital signs and the nursing notes.  Pertinent labs & imaging results that were available during my care of the patient were reviewed by me and considered in my medical decision making (see chart for details).     Patient presents to ED for evaluation of left eye swelling and discharge that began yesterday. She reports that when she woke up her eyes were matted together and that she has to use a warm compress to be able to open them. She reports improvement in symptoms with warm compresses. She reports similar symptoms in the past when she had pink eye. Physical exam patient is overall well-appearing. She has complaint of a migraine headache. She does have mildly edematous left upper eyelid with associated discharge and conjunctival injection noted. She has no pain with extraocular movements.  She is afebrile with no history of fever. She is hypertensive here in the ED and she states that she does not take any medications for high blood pressure was never told she was suffering from hypertension. Patient given Toradol here in the ED and will discharge with sulfacetamide drops to be taken as needed. Advised to continue ibuprofen or Aleve as needed for welling and inflammation and to continue warm compresses as directed. I have low suspicion for orbital cellulitis being the cause of her eye pain based on the discharge and physical exam finding. Her hypertension is most likely secondary to discomfort and she declines any further workup of this at that time. I advised her to follow-up with [facility] and wellness for further evaluation and management. Patient appears stable for discharge at this time. Strict return precautions given.  Final Clinical Impressions(s) / ED Diagnoses   Final diagnoses:  Acute bacterial conjunctivitis of left eye    New  Prescriptions New Prescriptions   SULFACETAMIDE (BLEPH-10) 10 % OPHTHALMIC SOLUTION    Place 1-2 drops into the left eye every 4 (four) hours.     Delia Heady, PA-C 04/04/17 1010    Quintella Reichert, MD 04/05/17 [phone]

## 2017-04-04 NOTE — ED Triage Notes (Signed)
Pt reports left eye pain, swelling, discharge, itching x 2 days.

## 2017-06-14 ENCOUNTER — Encounter (HOSPITAL_COMMUNITY): Payer: Self-pay | Admitting: Nurse Practitioner

## 2017-06-14 ENCOUNTER — Emergency Department (HOSPITAL_COMMUNITY)
Admission: EM | Admit: 2017-06-14 | Discharge: 2017-06-14 | Discharge status: Against Medical Advice | Payer: Self-pay | Attending: Emergency Medicine | Admitting: Emergency Medicine

## 2017-06-14 ENCOUNTER — Other Ambulatory Visit: Payer: Self-pay

## 2017-06-14 DIAGNOSIS — Z5321 Procedure and treatment not carried out due to patient leaving prior to being seen by health care provider: Secondary | ICD-10-CM | POA: Insufficient documentation

## 2017-06-14 DIAGNOSIS — R51 Headache: Secondary | ICD-10-CM | POA: Insufficient documentation

## 2017-06-14 NOTE — ED Triage Notes (Signed)
Patient reports she has been dealing with migraines for the past 3 days since cycle. Reports usually excedrin migraine works however it is not helping. States she also took a robaxin around 0530 with little relief. Denies visual changes but some pain over eyes with light sensitivity. Denies chest pain, dizziness, or numbness. Patient is ambulatory and A&O x4

## 2017-06-14 NOTE — ED Notes (Signed)
No answer from lobby  

## 2017-07-19 ENCOUNTER — Emergency Department (HOSPITAL_COMMUNITY)
Admission: EM | Admit: 2017-07-19 | Discharge: 2017-07-19 | Disposition: A | Discharge status: Home / Self Care | Payer: Self-pay | Attending: Emergency Medicine | Admitting: Emergency Medicine

## 2017-07-19 ENCOUNTER — Emergency Department (HOSPITAL_COMMUNITY): Payer: Self-pay

## 2017-07-19 ENCOUNTER — Encounter (HOSPITAL_COMMUNITY): Payer: Self-pay | Admitting: Emergency Medicine

## 2017-07-19 DIAGNOSIS — R102 Pelvic and perineal pain: Secondary | ICD-10-CM | POA: Insufficient documentation

## 2017-07-19 DIAGNOSIS — R1110 Vomiting, unspecified: Secondary | ICD-10-CM | POA: Insufficient documentation

## 2017-07-19 DIAGNOSIS — R197 Diarrhea, unspecified: Secondary | ICD-10-CM | POA: Insufficient documentation

## 2017-07-19 DIAGNOSIS — Z87891 Personal history of nicotine dependence: Secondary | ICD-10-CM | POA: Insufficient documentation

## 2017-07-19 LAB — I-STAT BETA HCG BLOOD, ED (MC, WL, AP ONLY)

## 2017-07-19 LAB — COMPREHENSIVE METABOLIC PANEL
ALK PHOS: 56 U/L (ref 38–126)
ALT: 19 U/L (ref 14–54)
ANION GAP: 9 (ref 5–15)
AST: 29 U/L (ref 15–41)
Albumin: 4.4 g/dL (ref 3.5–5.0)
BUN: 10 mg/dL (ref 6–20)
CALCIUM: 9.2 mg/dL (ref 8.9–10.3)
CO2: 22 mmol/L (ref 22–32)
Chloride: 108 mmol/L (ref 101–111)
Creatinine, Ser: 0.87 mg/dL (ref 0.44–1.00)
GFR calc non Af Amer: >60 mL/min (ref >60)
GLUCOSE: 112 mg/dL — AB (ref 65–99)
POTASSIUM: 3.8 mmol/L (ref 3.5–5.1)
SODIUM: 139 mmol/L (ref 135–145)
Total Bilirubin: 0.5 mg/dL (ref 0.3–1.2)
Total Protein: 8.6 g/dL — ABNORMAL HIGH (ref 6.5–8.1)

## 2017-07-19 LAB — URINALYSIS, ROUTINE W REFLEX MICROSCOPIC
Bilirubin Urine: NEGATIVE
Glucose, UA: NEGATIVE mg/dL
Hgb urine dipstick: NEGATIVE
Ketones, ur: 5 mg/dL — AB
LEUKOCYTES UA: NEGATIVE
NITRITE: NEGATIVE
PH: 7 (ref 5.0–8.0)
Protein, ur: NEGATIVE mg/dL
SPECIFIC GRAVITY, URINE: 1.016 (ref 1.005–1.030)

## 2017-07-19 LAB — CBC
HEMATOCRIT: 35.1 % — AB (ref 36.0–46.0)
HEMOGLOBIN: 11.7 g/dL — AB (ref 12.0–15.0)
MCH: 29 pg (ref 26.0–34.0)
MCHC: 33.3 g/dL (ref 30.0–36.0)
MCV: 87.1 fL (ref 78.0–100.0)
Platelets: 434 10*3/uL — ABNORMAL HIGH (ref 150–400)
RBC: 4.03 MIL/uL (ref 3.87–5.11)
RDW: 12.7 % (ref 11.5–15.5)
WBC: 8.9 10*3/uL (ref 4.0–10.5)

## 2017-07-19 LAB — WET PREP, GENITAL
CLUE CELLS WET PREP: NONE SEEN
Sperm: NONE SEEN
TRICH WET PREP: NONE SEEN
YEAST WET PREP: NONE SEEN

## 2017-07-19 LAB — LIPASE, BLOOD: Lipase: 32 U/L (ref 11–51)

## 2017-07-19 MED ORDER — KETOROLAC TROMETHAMINE 15 MG/ML IJ SOLN
15.0000 mg | Freq: Once | INTRAMUSCULAR | Status: AC
  Administered 2017-07-19: 15 mg via INTRAVENOUS
  Filled 2017-07-19: qty 1

## 2017-07-19 MED ORDER — SODIUM CHLORIDE 0.9 % IV BOLUS (SEPSIS)
1000.0000 mL | Freq: Once | INTRAVENOUS | Status: AC
  Administered 2017-07-19: 1000 mL via INTRAVENOUS

## 2017-07-19 MED ORDER — DOXYCYCLINE HYCLATE 100 MG PO CAPS: 100.0000 mg | ORAL_CAPSULE | Freq: Two times a day (BID) | ORAL | 0 refills | Status: DC

## 2017-07-19 MED ORDER — MORPHINE SULFATE (PF) 4 MG/ML IV SOLN
4.0000 mg | Freq: Once | INTRAVENOUS | Status: DC
  Filled 2017-07-19: qty 1

## 2017-07-19 MED ORDER — MORPHINE SULFATE (PF) 4 MG/ML IV SOLN
4.0000 mg | Freq: Once | INTRAVENOUS | Status: AC
  Administered 2017-07-19: 4 mg via INTRAVENOUS
  Filled 2017-07-19: qty 1

## 2017-07-19 MED ORDER — PROMETHAZINE HCL 25 MG PO TABS: 25.0000 mg | ORAL_TABLET | Freq: Four times a day (QID) | ORAL | 0 refills | Status: DC | PRN

## 2017-07-19 MED ORDER — PANTOPRAZOLE SODIUM 40 MG IV SOLR
40.0000 mg | Freq: Once | INTRAVENOUS | Status: AC
  Administered 2017-07-19: 40 mg via INTRAVENOUS
  Filled 2017-07-19: qty 40

## 2017-07-19 MED ORDER — LIDOCAINE HCL (PF) 1 % IJ SOLN
INTRAMUSCULAR | Status: AC
  Filled 2017-07-19: qty 5

## 2017-07-19 MED ORDER — NAPROXEN 375 MG PO TABS: 375.0000 mg | ORAL_TABLET | Freq: Two times a day (BID) | ORAL | 0 refills | Status: DC

## 2017-07-19 MED ORDER — CEFTRIAXONE SODIUM 250 MG IJ SOLR
250.0000 mg | Freq: Once | INTRAMUSCULAR | Status: AC
  Administered 2017-07-19: 250 mg via INTRAMUSCULAR
  Filled 2017-07-19: qty 250

## 2017-07-19 MED ORDER — ONDANSETRON HCL 4 MG/2ML IJ SOLN
4.0000 mg | Freq: Once | INTRAMUSCULAR | Status: AC
  Administered 2017-07-19: 4 mg via INTRAVENOUS
  Filled 2017-07-19: qty 2

## 2017-07-19 MED ORDER — ONDANSETRON 4 MG PO TBDP
4.0000 mg | ORAL_TABLET | Freq: Once | ORAL | Status: AC | PRN
  Administered 2017-07-19: 4 mg via ORAL
  Filled 2017-07-19: qty 1

## 2017-07-19 NOTE — ED Provider Notes (Signed)
[facility] DEPT Provider Note   CSN: [id number] Arrival date & time: 07/19/17  0920     History   Chief Complaint Chief Complaint  Patient presents with  . Abdominal Pain  . Emesis  . Diarrhea    HPI Carrie Guerra is a 32 y.o. female.  HPI 32 year old African-American female past medical history significant for cannabis hyperemesis, asthma, uterine fibroids that presents to the emergency department today with complaints of right lower pelvic pain along with nausea, vomiting, diarrhea.  Patient states that earlier this morning she had onset of several episodes of nonbloody bilious emesis.  She also reports several episodes of loose stool.  Patient also complains of some pain in her right lower pelvis.  Patient describes the pain as cramping in nature.  It is constant.  Does not radiate.  She reports some white discharge that is new.  Patient is sexually active with women.  No concern for an STD.  Patient states that she has had this pain several times in the past.  They state is related to her uterine fibroids.  Patient states that she just finished her menstrual cycle.  States that usually several days of her menstrual cycle she will have this pain but it will resolve up to 3 days.  Patient states however the pain was more severe today which is why she came to the ED.  Patient does report dyspareunia and heavy menstrual periods and cramps.  Patient denies any associated vaginal bleeding at this time.  She denies any blood in her stool.  Denies any associated fevers or chills.  Patient has not taking for pain prior to arrival.  Nothing makes better or worse.  Pt denies any fever, chill, ha, vision changes, lightheadedness, dizziness, congestion, neck pain, cp, sob, cough, urinary symptoms, change in bowel habits, melena, hematochezia, lower extremity paresthesias.  Past Medical History:  Diagnosis Date  . Asthma   . Cannabis hyperemesis syndrome concurrent  with and due to cannabis abuse ([place]) 11/09/2016  . Fibroids     Patient Active Problem List   Diagnosis Date Noted  . Vomiting 11/17/2016  . Hypokalemia 11/17/2016  . Cannabis hyperemesis syndrome concurrent with and due to cannabis abuse ([place]) 11/09/2016    Past Surgical History:  Procedure Laterality Date  . NO PAST SURGERIES      OB History    Gravida Para Term Preterm AB Living   0 0 0 0 0 0   SAB TAB Ectopic Multiple Live Births   0 0 0 0 0       Home Medications    Prior to Admission medications   Medication Sig Start Date End Date Taking? Authorizing Provider  ibuprofen (ADVIL,MOTRIN) 200 MG tablet Take 400 mg by mouth daily as needed for moderate pain.   Yes [provider]  acetaminophen (TYLENOL) 325 MG tablet Take 2 tablets (650 mg total) by mouth every 6 (six) hours as needed for mild pain (or Fever >/= 101). Patient not taking: Reported on 07/19/2017 11/19/16   Lavina Hamman, MD  alum & mag hydroxide-simeth (MAALOX/MYLANTA) 200-200-20 MG/5ML suspension Take 15 mLs by mouth every 6 (six) hours as needed for indigestion or heartburn. Patient not taking: Reported on 07/19/2017 11/19/16   Lavina Hamman, MD  aspirin-acetaminophen-caffeine [facility] MIGRAINE) [phone] MG tablet Take 2 tablets by mouth once as needed for headache or migraine.    [provider]  methocarbamol (ROBAXIN) 500 MG tablet Take 1 tablet (500 mg total)  by mouth 3 (three) times daily. Patient not taking: Reported on 04/04/2017 11/19/16   Lavina Hamman, MD  metroNIDAZOLE (FLAGYL) 500 MG tablet Take 1 tablet (500 mg total) by mouth 2 (two) times daily. Patient not taking: Reported on 04/04/2017 11/19/16   Lavina Hamman, MD  naproxen (NAPROSYN) 375 MG tablet Take 1 tablet (375 mg total) by mouth 2 (two) times daily. 07/19/17   Ocie Cornfield T, PA-C  ondansetron (ZOFRAN ODT) 8 MG disintegrating tablet Take 1 tablet (8 mg total) by mouth every 8 (eight) hours as needed for nausea  or vomiting. Patient not taking: Reported on 04/04/2017 11/09/16   Laury Deep, CNM  pantoprazole (PROTONIX) 40 MG tablet Take 1 tablet (40 mg total) by mouth 2 (two) times daily before a meal. 11/19/16 12/02/16  Lavina Hamman, MD  prochlorperazine (COMPAZINE) 25 MG suppository Place 1 suppository (25 mg total) rectally every 12 (twelve) hours as needed for nausea or vomiting. Patient not taking: Reported on 04/04/2017 11/08/16   Lorin Glass, PA-C  promethazine (PHENERGAN) 25 MG tablet Take 1 tablet (25 mg total) by mouth every 6 (six) hours as needed for nausea or vomiting. 07/19/17   Ocie Cornfield T, PA-C  sucralfate (CARAFATE) 1 GM/10ML suspension Take 10 mLs (1 g total) by mouth 4 (four) times daily -  with meals and at bedtime. Patient not taking: Reported on 04/04/2017 11/19/16   Lavina Hamman, MD  sulfacetamide (BLEPH-10) 10 % ophthalmic solution Place 1-2 drops into the left eye every 4 (four) hours. Patient not taking: Reported on 07/19/2017 04/04/17   Delia Heady, PA-C    Family History Family History  Problem Relation Age of Onset  . Diabetes Mother   . Diabetes Other     Social History Social History   Tobacco Use  . Smoking status: Former Smoker    Types: Cigarettes    Last attempt to quit: 10/30/2016    Years since quitting: 0.7  . Smokeless tobacco: Never Used  . Tobacco comment: 11/17/2016 "someday smoker when I did smoke"  Substance Use Topics  . Alcohol use: Yes    Comment: 11/17/2016 "might drink 2-3 days/month"  . Drug use: Yes    Types: Marijuana     Allergies   Shrimp [shellfish allergy] and Cabbage   Review of Systems Review of Systems  Constitutional: Negative for chills and fever.  HENT: Negative for congestion.   Eyes: Negative for visual disturbance.  Respiratory: Negative for cough and shortness of breath.   Cardiovascular: Negative for chest pain.  Gastrointestinal: Positive for abdominal pain, diarrhea, nausea and vomiting. Negative  for blood in stool.  Genitourinary: Positive for menstrual problem, pelvic pain and vaginal discharge. Negative for dysuria, flank pain, frequency, hematuria, urgency and vaginal bleeding.  Musculoskeletal: Negative for arthralgias and myalgias.  Skin: Negative for rash.  Neurological: Negative for dizziness, syncope, weakness, light-headedness, numbness and headaches.  Psychiatric/Behavioral: Negative for sleep disturbance. The patient is not nervous/anxious.      Physical Exam Updated Vital Signs BP 130/88   Pulse 71   Temp 97.6 F (36.4 C) (Oral)   Resp 16   LMP 07/12/2017   SpO2 99%   Physical Exam  Constitutional: She is oriented to person, place, and time. She appears well-developed and well-nourished.  Non-toxic appearance. No distress.  HENT:  Head: Normocephalic and atraumatic.  Nose: Nose normal.  Mouth/Throat: Oropharynx is clear and moist.  Eyes: Conjunctivae are normal. Pupils are equal, round, and reactive to light.  Right eye exhibits no discharge. Left eye exhibits no discharge.  Neck: Normal range of motion. Neck supple.  Cardiovascular: Normal rate, regular rhythm, normal heart sounds and intact distal pulses.  Pulmonary/Chest: Effort normal and breath sounds normal. No respiratory distress. She exhibits no tenderness.  Abdominal: Soft. Bowel sounds are normal. There is no tenderness. There is no rigidity, no rebound, no guarding, no CVA tenderness, no tenderness at McBurney's point and negative Murphy's sign.  Patient's tenderness is more right lower pelvic region.  She does not have any focal right sided lower quadrant tenderness or McBurney's point tenderness.  No signs of rebound.  Negative Rovsing sign.  No pain with obturator or psoas test.  Genitourinary:  Genitourinary Comments: Chaperone present for exam. No external lesions, swelling, erythema, or rash of the labia. No erythema, bleeding, or lesions noted in the vaginal vault.  Patient does have some  tenderness with speculum exam and digital exam.  There is no obvious cervical motion tenderness however unable to distinguish.No, bleeding or friability. No adnexal tenderness, mass or fullness bilaterally. No inguinal adenopathy or hernia.    Musculoskeletal: Normal range of motion. She exhibits no tenderness.  Lymphadenopathy:    She has no cervical adenopathy.  Neurological: She is alert and oriented to person, place, and time.  Skin: Skin is warm and dry. Capillary refill takes less than 2 seconds.  Psychiatric: Her behavior is normal. Judgment and thought content normal.  Nursing note and vitals reviewed.    ED Treatments / Results  Labs (all labs ordered are listed, but only abnormal results are displayed) Labs Reviewed  WET PREP, GENITAL - Abnormal; Notable for the following components:      Result Value   WBC, Wet Prep HPF POC MANY (*)    All other components within normal limits  COMPREHENSIVE METABOLIC PANEL - Abnormal; Notable for the following components:   Glucose, Bld 112 (*)    Total Protein 8.6 (*)    All other components within normal limits  CBC - Abnormal; Notable for the following components:   Hemoglobin 11.7 (*)    HCT 35.1 (*)    Platelets 434 (*)    All other components within normal limits  URINALYSIS, ROUTINE W REFLEX MICROSCOPIC - Abnormal; Notable for the following components:   Ketones, ur 5 (*)    All other components within normal limits  LIPASE, BLOOD  I-STAT BETA HCG BLOOD, ED ([facility], [facility], [facility] ONLY)  GC/CHLAMYDIA PROBE AMP ([place]) NOT AT [facility]    EKG  EKG Interpretation None       Radiology US Transvaginal Non-ob  Result Date: 07/19/2017 CLINICAL DATA:  Acute right lower quadrant pain.  LMP 06/28/2017 EXAM: TRANSABDOMINAL AND TRANSVAGINAL ULTRASOUND OF PELVIS DOPPLER ULTRASOUND OF OVARIES TECHNIQUE: Both transabdominal and transvaginal ultrasound examinations of the pelvis were performed. Transabdominal technique was performed for global  imaging of the pelvis including uterus, ovaries, adnexal regions, and pelvic cul-de-sac. It was necessary to proceed with endovaginal exam following the transabdominal exam to visualize the uterus and ovaries. Color and duplex Doppler ultrasound was utilized to evaluate blood flow to the ovaries. COMPARISON:  None. FINDINGS: Uterus Measurements: 8.0 x 5.6 x 5.4 cm. There are multiple uterine fibroids. Fibroid at the left uterine fundus measures 4.3 x 3.4 x 3.5 cm. Right uterine fundal fibroid measures 3.6 x 2.7 x 3.1 cm. Left mid uterine body fibroid measures 1.7 x 1.7 x 1.5 cm. Endometrium Thickness: 15 mm.  No focal abnormality visualized. Right ovary  Measurements: 3.5 x 1.7 x 2.4 cm. Normal appearance/no adnexal mass. Left ovary The left ovary could not be visualized due to overlying bowel gas. Pulsed Doppler evaluation of the right ovary demonstrates normal low-resistance arterial and venous waveforms. Other findings No abnormal free fluid. IMPRESSION: 1. Multiple uterine fibroids. 2. Normal right ovary with normal blood flow. 3. Nonvisualization of left ovary due to bowel gas. Electronically Signed   By: Ulyses Jarred M.D.   On: 07/19/2017 15:34   US Pelvis Complete  Result Date: 07/19/2017 CLINICAL DATA:  Acute right lower quadrant pain.  LMP 06/28/2017 EXAM: TRANSABDOMINAL AND TRANSVAGINAL ULTRASOUND OF PELVIS DOPPLER ULTRASOUND OF OVARIES TECHNIQUE: Both transabdominal and transvaginal ultrasound examinations of the pelvis were performed. Transabdominal technique was performed for global imaging of the pelvis including uterus, ovaries, adnexal regions, and pelvic cul-de-sac. It was necessary to proceed with endovaginal exam following the transabdominal exam to visualize the uterus and ovaries. Color and duplex Doppler ultrasound was utilized to evaluate blood flow to the ovaries. COMPARISON:  None. FINDINGS: Uterus Measurements: 8.0 x 5.6 x 5.4 cm. There are multiple uterine fibroids. Fibroid at the left  uterine fundus measures 4.3 x 3.4 x 3.5 cm. Right uterine fundal fibroid measures 3.6 x 2.7 x 3.1 cm. Left mid uterine body fibroid measures 1.7 x 1.7 x 1.5 cm. Endometrium Thickness: 15 mm.  No focal abnormality visualized. Right ovary Measurements: 3.5 x 1.7 x 2.4 cm. Normal appearance/no adnexal mass. Left ovary The left ovary could not be visualized due to overlying bowel gas. Pulsed Doppler evaluation of the right ovary demonstrates normal low-resistance arterial and venous waveforms. Other findings No abnormal free fluid. IMPRESSION: 1. Multiple uterine fibroids. 2. Normal right ovary with normal blood flow. 3. Nonvisualization of left ovary due to bowel gas. Electronically Signed   By: Ulyses Jarred M.D.   On: 07/19/2017 15:34   [place] Art/ven Flow Abd Pelv Doppler  Result Date: 07/19/2017 CLINICAL DATA:  Acute right lower quadrant pain.  LMP 06/28/2017 EXAM: TRANSABDOMINAL AND TRANSVAGINAL ULTRASOUND OF PELVIS DOPPLER ULTRASOUND OF OVARIES TECHNIQUE: Both transabdominal and transvaginal ultrasound examinations of the pelvis were performed. Transabdominal technique was performed for global imaging of the pelvis including uterus, ovaries, adnexal regions, and pelvic cul-de-sac. It was necessary to proceed with endovaginal exam following the transabdominal exam to visualize the uterus and ovaries. Color and duplex Doppler ultrasound was utilized to evaluate blood flow to the ovaries. COMPARISON:  None. FINDINGS: Uterus Measurements: 8.0 x 5.6 x 5.4 cm. There are multiple uterine fibroids. Fibroid at the left uterine fundus measures 4.3 x 3.4 x 3.5 cm. Right uterine fundal fibroid measures 3.6 x 2.7 x 3.1 cm. Left mid uterine body fibroid measures 1.7 x 1.7 x 1.5 cm. Endometrium Thickness: 15 mm.  No focal abnormality visualized. Right ovary Measurements: 3.5 x 1.7 x 2.4 cm. Normal appearance/no adnexal mass. Left ovary The left ovary could not be visualized due to overlying bowel gas. Pulsed Doppler evaluation  of the right ovary demonstrates normal low-resistance arterial and venous waveforms. Other findings No abnormal free fluid. IMPRESSION: 1. Multiple uterine fibroids. 2. Normal right ovary with normal blood flow. 3. Nonvisualization of left ovary due to bowel gas. Electronically Signed   By: Ulyses Jarred M.D.   On: 07/19/2017 15:34    Procedures Procedures (including critical care time)  Medications Ordered in ED Medications  morphine 4 MG/ML injection 4 mg (4 mg Intravenous Refused 07/19/17 1518)  ketorolac (TORADOL) 15 MG/ML injection 15 mg (not administered)  ondansetron (ZOFRAN-ODT) disintegrating tablet 4 mg (4 mg Oral Given 07/19/17 0937)  sodium chloride 0.9 % bolus 1,000 mL (1,000 mLs Intravenous New Bag/Given 07/19/17 1309)  ondansetron (ZOFRAN) injection 4 mg (4 mg Intravenous Given 07/19/17 1309)  morphine 4 MG/ML injection 4 mg (4 mg Intravenous Given 07/19/17 1309)  pantoprazole (PROTONIX) injection 40 mg (40 mg Intravenous Given 07/19/17 1309)     Initial Impression / Assessment and Plan / ED Course  I have reviewed the triage vital signs and the nursing notes.  Pertinent labs & imaging results that were available during my care of the patient were reviewed by me and considered in my medical decision making (see chart for details).     Patient presents to the ED with complaints of nausea, vomiting, diarrhea and lower pelvic pain.  Reports history of fibroids and states that this pain has been similar in the past.  Patient recently finished her menstrual cycle.  She also reports some loose stool and vomiting this morning.  Denies any associated fever, chills, blood in her stool or urinary symptoms.  On exam patient does appear to be uncomfortable in the bed holding her right pelvic region.  Patient is overall nontoxic or septic appearing.  Her vital signs are reassuring.  She is afebrile, no tachycardia or hypotension is noted.  Patient does not have any specific abdominal  tenderness to palpation.  Patient has no signs of peritonitis.  Patient does not have any rebound, McBurney's point tenderness, Rovsing sign that would be concerning for an appendicitis.  Pelvic exam does reveal significant tenderness with speculum exam unable to determine the cervical motion tenderness or due to her fibroids.  Patient does have a mild white discharge noted.  Patient's lab work is overall with reassuring.  No leukocytosis noted.  Hemoglobin appears at baseline.  Kidney function is normal.  Normal liver functions and lipase.  Her UA does not show any signs of infection.  Wet prep reveals WBCs but no other abnormalities noted.  Gonorrhea and Chlamydia cultures are pending at this time.  Patient history of fibroids and possibly ovarian cyst will obtain ultrasound to rule out ovarian torsion.  At this time I have low suspicion for an appendicitis givenfocal right lower quadrant tenderness, afebrile with no leukocytosis.  Ultrasound does reveal several fibroids of the uterus.  It does show the right ovary with good blood flow and no signs of torsion.  Unable to visualize left ovary due to bowel gas pattern.  Patient's pain is been managed in the ED.  Repeat abdominal exam without any signs of tenderness.  Patient is sitting in the bed at this time talking on her phone and with her friend.  States her pain is better and she feels stable for discharge at this time.  I did discuss with the patient about further imaging with CT scan to rule out appendicitis however have low suspicion at this time.  Patient states that she would like to avoid at this time if possible.  I am agreed with this plan.  Have given patient very strict return precautions.  Patient's pain seems likely related to her fibroids.  However given the discharge with severe tenderness with a pelvic exam could be concern for possible PID.  Will treat with IM Rocephin and start patient on 14 days of doxycycline.  Have encouraged patient  to take NSAIDs and heating pad at home.  She will also need close GYN follow-up.  Clinical presentation does not seem consistent with  bowel obstruction, diverticulitis, ectopic pregnancy, appendicitis.  Pt is hemodynamically stable, in NAD, & able to ambulate in the ED. Evaluation does not show pathology that would require ongoing emergent intervention or inpatient treatment. I explained the diagnosis to the patient. Pain has been managed & has no complaints prior to dc. Pt is comfortable with above plan and is stable for discharge at this time. All questions were answered prior to disposition. Strict return precautions for f/u to the ED were discussed. Encouraged follow up with PCP.   Final Clinical Impressions(s) / ED Diagnoses   Final diagnoses:  Pelvic pain    ED Discharge Orders        Ordered    naproxen (NAPROSYN) 375 MG tablet  2 times daily     07/19/17 1600    promethazine (PHENERGAN) 25 MG tablet  Every 6 hours PRN     07/19/17 1603       Doristine Devoid, PA-C 07/19/17 1637    Gareth [place], MD 07/20/17 1934

## 2017-07-19 NOTE — Discharge Instructions (Signed)
Your blood work has been reassuring.  Your ultrasound does show uterine fibroids.  Make sure you follow-up with your GYN doctor.  Please take the Naproxen as prescribed for pain. Do not take any additional NSAIDs including Motrin, Aleve, Ibuprofen, Advil.  Warm heating pad to the affected area.  We will give a short course of nausea medication.  Drink plenty of fluids.  Clear liquid diet for 24-48 hours.  Return to ED with any worsening symptoms including fever, worsening pain, blood in stool or worsening vomiting.

## 2017-07-19 NOTE — ED Triage Notes (Signed)
Patient c/o abd pain with n/v/d that started today.

## 2017-07-20 LAB — GC/CHLAMYDIA PROBE AMP (~~LOC~~) NOT AT ARMC
CHLAMYDIA, DNA PROBE: NEGATIVE
Neisseria Gonorrhea: NEGATIVE

## 2018-01-02 ENCOUNTER — Other Ambulatory Visit: Payer: Self-pay

## 2018-01-02 ENCOUNTER — Encounter (HOSPITAL_COMMUNITY): Payer: Self-pay | Admitting: Emergency Medicine

## 2018-01-02 ENCOUNTER — Emergency Department (HOSPITAL_COMMUNITY)
Admission: EM | Admit: 2018-01-02 | Discharge: 2018-01-02 | Disposition: A | Discharge status: Home / Self Care | Payer: Self-pay | Attending: Emergency Medicine | Admitting: Emergency Medicine

## 2018-01-02 DIAGNOSIS — J45909 Unspecified asthma, uncomplicated: Secondary | ICD-10-CM | POA: Insufficient documentation

## 2018-01-02 DIAGNOSIS — Z87891 Personal history of nicotine dependence: Secondary | ICD-10-CM | POA: Insufficient documentation

## 2018-01-02 DIAGNOSIS — H0011 Chalazion right upper eyelid: Secondary | ICD-10-CM | POA: Insufficient documentation

## 2018-01-02 DIAGNOSIS — Z79899 Other long term (current) drug therapy: Secondary | ICD-10-CM | POA: Insufficient documentation

## 2018-01-02 NOTE — ED Triage Notes (Signed)
Pt. Stated, Donnald Garre had what looks like a sty has been there for a month , its getting bigger and hurts. I also feel like there's one doming up on my left side.

## 2018-01-02 NOTE — ED Provider Notes (Signed)
[facility] EMERGENCY DEPARTMENT Provider Note   CSN: [id number] Arrival date & time: 01/02/18  1409     History   Chief Complaint Chief Complaint  Patient presents with  . Eye Problem    HPI Kattaleya Mcclaine is a 32 y.o. female.  HPI Patient presents to the emergency department with swelling to the right upper eyelid.  She states this is been ongoing for the last 2 months.  Patient states that she also has a small area developing on the left upper eyelid.  Patient states the one on the right is gotten very large and is causing some irritation to the eye.  Patient states she has had no other symptoms.  She states that is not painful.  Patient states she has been using warm compresses on the area. Past Medical History:  Diagnosis Date  . Asthma   . Cannabis hyperemesis syndrome concurrent with and due to cannabis abuse ([place]) 11/09/2016  . Fibroids     Patient Active Problem List   Diagnosis Date Noted  . Vomiting 11/17/2016  . Hypokalemia 11/17/2016  . Cannabis hyperemesis syndrome concurrent with and due to cannabis abuse ([place]) 11/09/2016    Past Surgical History:  Procedure Laterality Date  . NO PAST SURGERIES       OB History    Gravida  0   Para  0   Term  0   Preterm  0   AB  0   Living  0     SAB  0   TAB  0   Ectopic  0   Multiple  0   Live Births  0            Home Medications    Prior to Admission medications   Medication Sig Start Date End Date Taking? Authorizing Provider  acetaminophen (TYLENOL) 325 MG tablet Take 2 tablets (650 mg total) by mouth every 6 (six) hours as needed for mild pain (or Fever >/= 101). Patient not taking: Reported on 07/19/2017 11/19/16   Lavina Hamman, MD  alum & mag hydroxide-simeth (MAALOX/MYLANTA) 200-200-20 MG/5ML suspension Take 15 mLs by mouth every 6 (six) hours as needed for indigestion or heartburn. Patient not taking: Reported on 07/19/2017 11/19/16   Lavina Hamman, MD    aspirin-acetaminophen-caffeine [facility] MIGRAINE) [phone] MG tablet Take 2 tablets by mouth once as needed for headache or migraine.    [provider]  doxycycline (VIBRAMYCIN) 100 MG capsule Take 1 capsule (100 mg total) by mouth 2 (two) times daily. 07/19/17   Doristine Devoid, PA-C  ibuprofen (ADVIL,MOTRIN) 200 MG tablet Take 400 mg by mouth daily as needed for moderate pain.    [provider]  methocarbamol (ROBAXIN) 500 MG tablet Take 1 tablet (500 mg total) by mouth 3 (three) times daily. Patient not taking: Reported on 04/04/2017 11/19/16   Lavina Hamman, MD  metroNIDAZOLE (FLAGYL) 500 MG tablet Take 1 tablet (500 mg total) by mouth 2 (two) times daily. Patient not taking: Reported on 04/04/2017 11/19/16   Lavina Hamman, MD  naproxen (NAPROSYN) 375 MG tablet Take 1 tablet (375 mg total) by mouth 2 (two) times daily. 07/19/17   Ocie Cornfield T, PA-C  ondansetron (ZOFRAN ODT) 8 MG disintegrating tablet Take 1 tablet (8 mg total) by mouth every 8 (eight) hours as needed for nausea or vomiting. Patient not taking: Reported on 04/04/2017 11/09/16   Laury Deep, CNM  pantoprazole (PROTONIX) 40 MG tablet Take 1  tablet (40 mg total) by mouth 2 (two) times daily before a meal. 11/19/16 12/02/16  Lavina Hamman, MD  prochlorperazine (COMPAZINE) 25 MG suppository Place 1 suppository (25 mg total) rectally every 12 (twelve) hours as needed for nausea or vomiting. Patient not taking: Reported on 04/04/2017 11/08/16   Lorin Glass, PA-C  promethazine (PHENERGAN) 25 MG tablet Take 1 tablet (25 mg total) by mouth every 6 (six) hours as needed for nausea or vomiting. 07/19/17   Ocie Cornfield T, PA-C  sucralfate (CARAFATE) 1 GM/10ML suspension Take 10 mLs (1 g total) by mouth 4 (four) times daily -  with meals and at bedtime. Patient not taking: Reported on 04/04/2017 11/19/16   Lavina Hamman, MD  sulfacetamide (BLEPH-10) 10 % ophthalmic solution Place 1-2 drops into the  left eye every 4 (four) hours. Patient not taking: Reported on 07/19/2017 04/04/17   Delia Heady, PA-C    Family History Family History  Problem Relation Age of Onset  . Diabetes Mother   . Diabetes Other     Social History Social History   Tobacco Use  . Smoking status: Former Smoker    Types: Cigarettes    Last attempt to quit: 10/30/2016    Years since quitting: 1.1  . Smokeless tobacco: Never Used  . Tobacco comment: 11/17/2016 "someday smoker when I did smoke"  Substance Use Topics  . Alcohol use: Yes    Comment: 11/17/2016 "might drink 2-3 days/month"  . Drug use: Yes    Types: Marijuana     Allergies   Shrimp [shellfish allergy] and Cabbage   Review of Systems Review of Systems All other systems negative except as documented in the HPI. All pertinent positives and negatives as reviewed in the HPI.  Physical Exam Updated Vital Signs BP 132/89 (BP Location: Right Arm)   Pulse 78   Temp 98.3 F (36.8 C) (Oral)   Resp 20   Ht 5' (1.524 m)   Wt 68 kg (150 lb)   LMP 12/24/2017   SpO2 100%   BMI 29.29 kg/m   Physical Exam  Constitutional: She is oriented to person, place, and time. She appears well-developed and well-nourished. No distress.  HENT:  Head: Normocephalic and atraumatic.  Eyes: Pupils are equal, round, and reactive to light.    Pulmonary/Chest: Effort normal.  Neurological: She is alert and oriented to person, place, and time.  Skin: Skin is warm and dry.  Psychiatric: She has a normal mood and affect.  Nursing note and vitals reviewed.    ED Treatments / Results  Labs (all labs ordered are listed, but only abnormal results are displayed) Labs Reviewed - No data to display  EKG None  Radiology No results found.  Procedures Procedures (including critical care time)  Medications Ordered in ED Medications - No data to display   Initial Impression / Assessment and Plan / ED Course  I have reviewed the triage vital signs and the  nursing notes.  Pertinent labs & imaging results that were available during my care of the patient were reviewed by me and considered in my medical decision making (see chart for details).     Patient will be referred to ophthalmology due to the fact that it is fairly large and will most likely need further intervention other than the conservative treatments.  Patient is advised the plan and all questions were answered.  I did ask her to continue using warm compresses and natural tears for lubrication.  Final Clinical  Impressions(s) / ED Diagnoses   Final diagnoses:  None    ED Discharge Orders    None       Dalia Heading, PA-C 01/02/18 [place], Wenda Overland, MD 01/03/18 1244

## 2018-01-02 NOTE — ED Provider Notes (Signed)
Patient placed in Quick Look pathway, seen and evaluated   Chief Complaint: knot to R upper eyelid  HPI:   Mildly discomforting knot to R upper eye lid x 2 months, now new smaller knot to L upper eyelid  ROS: no vision changes, no drainage, no trauma (one)  Physical Exam:   Gen: No distress  Neuro: Awake and Alert  Skin: Warm    Focused Exam: R upper eyelid (chalazion noted), L upper eyelid (small developing chalazion)   Initiation of care has begun. The patient has been counseled on the process, plan, and necessity for staying for the completion/evaluation, and the remainder of the medical screening examination    Domenic Moras, Hershal Coria 01/02/18 [place], MD 01/03/18 [phone]

## 2018-01-02 NOTE — Discharge Instructions (Addendum)
Return here as needed.  Continue using warm compresses 15 minutes at a time at least 4 times a day.  I would also get some natural tears to lubricate that eyes so it is not as irritating.  Call the ophthalmologist as soon as possible for an appointment.

## 2018-05-23 ENCOUNTER — Emergency Department (HOSPITAL_COMMUNITY)
Admission: EM | Admit: 2018-05-23 | Discharge: 2018-05-23 | Disposition: A | Discharge status: Home / Self Care | Payer: Self-pay | Attending: Emergency Medicine | Admitting: Emergency Medicine

## 2018-05-23 ENCOUNTER — Encounter (HOSPITAL_COMMUNITY): Payer: Self-pay | Admitting: Emergency Medicine

## 2018-05-23 ENCOUNTER — Other Ambulatory Visit: Payer: Self-pay

## 2018-05-23 DIAGNOSIS — Z87891 Personal history of nicotine dependence: Secondary | ICD-10-CM | POA: Insufficient documentation

## 2018-05-23 DIAGNOSIS — Z79899 Other long term (current) drug therapy: Secondary | ICD-10-CM | POA: Insufficient documentation

## 2018-05-23 DIAGNOSIS — F1290 Cannabis use, unspecified, uncomplicated: Secondary | ICD-10-CM | POA: Insufficient documentation

## 2018-05-23 DIAGNOSIS — J020 Streptococcal pharyngitis: Secondary | ICD-10-CM | POA: Insufficient documentation

## 2018-05-23 DIAGNOSIS — R0981 Nasal congestion: Secondary | ICD-10-CM | POA: Insufficient documentation

## 2018-05-23 DIAGNOSIS — R590 Localized enlarged lymph nodes: Secondary | ICD-10-CM | POA: Insufficient documentation

## 2018-05-23 LAB — GROUP A STREP BY PCR: GROUP A STREP BY PCR: NOT DETECTED

## 2018-05-23 MED ORDER — ACETAMINOPHEN 325 MG PO TABS
650.0000 mg | ORAL_TABLET | Freq: Once | ORAL | Status: AC | PRN
  Administered 2018-05-23: 650 mg via ORAL
  Filled 2018-05-23: qty 2

## 2018-05-23 MED ORDER — AMOXICILLIN 500 MG PO CAPS: 500.0000 mg | ORAL_CAPSULE | Freq: Two times a day (BID) | ORAL | 0 refills | Status: AC

## 2018-05-23 NOTE — Discharge Instructions (Signed)
Take antibiotics as directed. Please take all of your antibiotics until finished.  You can take Tylenol or Ibuprofen as directed for pain. You can alternate Tylenol and Ibuprofen every 4 hours. If you take Tylenol at 1pm, then you can take Ibuprofen at 5pm. Then you can take Tylenol again at 9pm.   Make sure you are drinking plenty of fluids and staying hydrated.   Return the emergency department for any worsening pain, fever despite medications, difficulty swallowing your saliva, drooling, vomiting or any other worsening or concerning symptoms.

## 2018-05-23 NOTE — ED Provider Notes (Signed)
[facility] DEPT Provider Note   CSN: [id number] Arrival date & time: 05/23/18  1316     History   Chief Complaint Chief Complaint  Patient presents with  . Sore Throat    HPI Carrie Guerra is a 32 y.o. female past medical history of asthma who presents for evaluation of 2 days of sore throat.  She reports associated nasal congestion, rhinorrhea.  She states that she had subjective fever yesterday but never measured a temperature.  She has been able to swallow her secretions and p.o. but does report worsening pain with attempting to swallow.  She took ibuprofen yesterday with minimal improvement in pain.  Patient does report she has had a cough that is productive of mucus.  Patient does report that she smokes but does not be.  She does have a history of asthma and has not had to increase her inhaler use.  The history is provided by the patient.    Past Medical History:  Diagnosis Date  . Asthma   . Cannabis hyperemesis syndrome concurrent with and due to cannabis abuse ([place]) 11/09/2016  . Fibroids     Patient Active Problem List   Diagnosis Date Noted  . Vomiting 11/17/2016  . Hypokalemia 11/17/2016  . Cannabis hyperemesis syndrome concurrent with and due to cannabis abuse ([place]) 11/09/2016    Past Surgical History:  Procedure Laterality Date  . NO PAST SURGERIES       OB History    Gravida  0   Para  0   Term  0   Preterm  0   AB  0   Living  0     SAB  0   TAB  0   Ectopic  0   Multiple  0   Live Births  0            Home Medications    Prior to Admission medications   Medication Sig Start Date End Date Taking? Authorizing Provider  acetaminophen (TYLENOL) 325 MG tablet Take 2 tablets (650 mg total) by mouth every 6 (six) hours as needed for mild pain (or Fever >/= 101). Patient not taking: Reported on 07/19/2017 11/19/16   Lavina Hamman, MD  alum & mag hydroxide-simeth (MAALOX/MYLANTA) 200-200-20 MG/5ML  suspension Take 15 mLs by mouth every 6 (six) hours as needed for indigestion or heartburn. Patient not taking: Reported on 07/19/2017 11/19/16   Lavina Hamman, MD  amoxicillin (AMOXIL) 500 MG capsule Take 1 capsule (500 mg total) by mouth 2 (two) times daily for 7 days. 05/23/18 05/30/18  Volanda Napoleon, PA-C  aspirin-acetaminophen-caffeine (EXCEDRIN MIGRAINE) [phone] MG tablet Take 2 tablets by mouth once as needed for headache or migraine.    [provider]  ibuprofen (ADVIL,MOTRIN) 200 MG tablet Take 400 mg by mouth daily as needed for moderate pain.    [provider]  methocarbamol (ROBAXIN) 500 MG tablet Take 1 tablet (500 mg total) by mouth 3 (three) times daily. Patient not taking: Reported on 04/04/2017 11/19/16   Lavina Hamman, MD  metroNIDAZOLE (FLAGYL) 500 MG tablet Take 1 tablet (500 mg total) by mouth 2 (two) times daily. Patient not taking: Reported on 04/04/2017 11/19/16   Lavina Hamman, MD  naproxen (NAPROSYN) 375 MG tablet Take 1 tablet (375 mg total) by mouth 2 (two) times daily. 07/19/17   Ocie Cornfield T, PA-C  ondansetron (ZOFRAN ODT) 8 MG disintegrating tablet Take 1 tablet (8 mg total) by mouth every  8 (eight) hours as needed for nausea or vomiting. Patient not taking: Reported on 04/04/2017 11/09/16   Laury Deep, CNM  pantoprazole (PROTONIX) 40 MG tablet Take 1 tablet (40 mg total) by mouth 2 (two) times daily before a meal. 11/19/16 12/02/16  Lavina Hamman, MD  prochlorperazine (COMPAZINE) 25 MG suppository Place 1 suppository (25 mg total) rectally every 12 (twelve) hours as needed for nausea or vomiting. Patient not taking: Reported on 04/04/2017 11/08/16   Lorin Glass, PA-C  promethazine (PHENERGAN) 25 MG tablet Take 1 tablet (25 mg total) by mouth every 6 (six) hours as needed for nausea or vomiting. 07/19/17   Ocie Cornfield T, PA-C  sucralfate (CARAFATE) 1 GM/10ML suspension Take 10 mLs (1 g total) by mouth 4 (four) times daily -   with meals and at bedtime. Patient not taking: Reported on 04/04/2017 11/19/16   Lavina Hamman, MD  sulfacetamide (BLEPH-10) 10 % ophthalmic solution Place 1-2 drops into the left eye every 4 (four) hours. Patient not taking: Reported on 07/19/2017 04/04/17   Delia Heady, PA-C    Family History Family History  Problem Relation Age of Onset  . Diabetes Mother   . Diabetes Other     Social History Social History   Tobacco Use  . Smoking status: Former Smoker    Types: Cigarettes  . Smokeless tobacco: Never Used  . Tobacco comment: 11/17/2016 "someday smoker when I did smoke"  Substance Use Topics  . Alcohol use: Yes    Comment: 11/17/2016 "might drink 2-3 days/month"  . Drug use: Yes    Types: Marijuana     Allergies   Shrimp [shellfish allergy] and Cabbage   Review of Systems Review of Systems  Constitutional: Positive for fever (subjective).  HENT: Positive for congestion, rhinorrhea and sore throat. Negative for drooling and trouble swallowing.   Respiratory: Negative for cough and shortness of breath.   Gastrointestinal: Negative for vomiting.  All other systems reviewed and are negative.    Physical Exam Updated Vital Signs BP (!) 136/98 (BP Location: Right Arm)   Pulse 74   Temp 98.5 F (36.9 C) (Oral)   Resp 18   Wt 63.5 kg   LMP 04/21/2018 (Exact Date)   SpO2 100%   BMI 27.34 kg/m   Physical Exam  Constitutional: She appears well-developed and well-nourished.  HENT:  Head: Normocephalic and atraumatic.  Mouth/Throat: Uvula is midline and mucous membranes are normal. No trismus in the jaw. Posterior oropharyngeal edema and posterior oropharyngeal erythema present. Tonsillar exudate.  Airway is patent, phonation is intact.  No oral angioedema. Posterior oropharynx with erythema, edema.  Bilateral tonsillar exudates noted.  Uvula is midline.  No trismus.  Eyes: Conjunctivae and EOM are normal. Right eye exhibits no discharge. Left eye exhibits no  discharge. No scleral icterus.  Pulmonary/Chest: Effort normal and breath sounds normal.  Lungs clear to auscultation bilaterally.  Symmetric chest rise.  No wheezing, rales, rhonchi.  Lymphadenopathy:    She has cervical adenopathy.  Neurological: She is alert.  Skin: Skin is warm and dry.  Psychiatric: She has a normal mood and affect. Her speech is normal and behavior is normal.  Nursing note and vitals reviewed.    ED Treatments / Results  Labs (all labs ordered are listed, but only abnormal results are displayed) Labs Reviewed  GROUP A STREP BY PCR    EKG None  Radiology No results found.  Procedures Procedures (including critical care time)  Medications Ordered in ED  Medications  acetaminophen (TYLENOL) tablet 650 mg (650 mg Oral Given 05/23/18 1344)     Initial Impression / Assessment and Plan / ED Course  I have reviewed the triage vital signs and the nursing notes.  Pertinent labs & imaging results that were available during my care of the patient were reviewed by me and considered in my medical decision making (see chart for details).       32 y.o. F who presents with 2 days sore throat. Still able to tolerate PO/secretions but with worsening pain. Patient is afebrile, non-toxic appearing, sitting comfortably on examination table. Vital signs reviewed and stable. On exam, patient with posterior oropharynx erythema, edema.  Bilateral tonsillar exudates noted.  Uvula is midline.  No trismus.  Concern for pharyngitis.Marland Kitchen History/physical exam is not concerning for Ludwing's Angina or peritonsillar abscess. Strep ordered at triage.   Given patient's cervical lymphadenopathy, exudates, erythema, edema we will plan to treat as strep.  I discussed with patient that given her exam, regardless of results would plan to treat her for strep.  Patient is agreement.  Patient with no known drug allergies.  Plan to treat with abx. Encouraged at home supportive care measures.  Patient had ample opportunity for questions and discussion. All patient's questions were answered with full understanding. Strict return precautions discussed. Patient expresses understanding and agreement to plan.     Final Clinical Impressions(s) / ED Diagnoses   Final diagnoses:  Strep throat    ED Discharge Orders         Ordered    amoxicillin (AMOXIL) 500 MG capsule  2 times daily     05/23/18 1404           Volanda Napoleon, PA-C 05/23/18 1457    Charlesetta Shanks, MD 05/24/18 1237

## 2018-05-23 NOTE — ED Triage Notes (Signed)
Pt reports a 2 day hx of sore throat. Stated that she may have had a fever yesterday. Treated with OTC meds. Last motrin 2 days ago.Redness and swelling noted in posterior pharynx. Small white pustules noted.

## 2018-05-28 ENCOUNTER — Encounter (HOSPITAL_COMMUNITY): Payer: Self-pay | Admitting: Emergency Medicine

## 2018-05-28 ENCOUNTER — Emergency Department (HOSPITAL_COMMUNITY)
Admission: EM | Admit: 2018-05-28 | Discharge: 2018-05-28 | Disposition: A | Discharge status: Home / Self Care | Payer: Self-pay | Attending: Emergency Medicine | Admitting: Emergency Medicine

## 2018-05-28 ENCOUNTER — Other Ambulatory Visit: Payer: Self-pay

## 2018-05-28 DIAGNOSIS — Z87891 Personal history of nicotine dependence: Secondary | ICD-10-CM | POA: Insufficient documentation

## 2018-05-28 DIAGNOSIS — Z79899 Other long term (current) drug therapy: Secondary | ICD-10-CM | POA: Insufficient documentation

## 2018-05-28 DIAGNOSIS — R590 Localized enlarged lymph nodes: Secondary | ICD-10-CM | POA: Insufficient documentation

## 2018-05-28 DIAGNOSIS — J029 Acute pharyngitis, unspecified: Secondary | ICD-10-CM | POA: Insufficient documentation

## 2018-05-28 DIAGNOSIS — J45909 Unspecified asthma, uncomplicated: Secondary | ICD-10-CM | POA: Insufficient documentation

## 2018-05-28 DIAGNOSIS — K0889 Other specified disorders of teeth and supporting structures: Secondary | ICD-10-CM | POA: Insufficient documentation

## 2018-05-28 MED ORDER — DEXAMETHASONE 4 MG PO TABS
10.0000 mg | ORAL_TABLET | Freq: Once | ORAL | Status: AC
  Administered 2018-05-28: 13:00:00 10 mg via ORAL
  Filled 2018-05-28: qty 2

## 2018-05-28 MED ORDER — LIDOCAINE VISCOUS HCL 2 % MT SOLN: 15.0000 mL | OROMUCOSAL | 0 refills | Status: DC | PRN

## 2018-05-28 NOTE — ED Triage Notes (Signed)
Was seen here last week for sore throat and told to follow up if pain continued.

## 2018-05-28 NOTE — ED Provider Notes (Signed)
[facility] DEPT Provider Note   CSN: [id number] Arrival date & time: 05/28/18  1008     History   Chief Complaint Chief Complaint  Patient presents with  . Sore Throat    HPI Carrie Guerra is a 32 y.o. female with a medical history as below who presents emergency department today for sore throat.  Patient was seen here on 11/27 with a 2-day history of sore throat with associated nasal congestion, rhinorrhea, subjective fevers and dysphasia.  Patient was clinically diagnosed with bacterial tonsillitis and treated with amoxicillin.  Patient reports that she still has 3 pills left and has been taking as prescribed.  She notes that she is presenting for follow-up as she is continued to have pain and that is what she was told to do.  She has not establish care with a primary care provider.  The patient reports that she has been taking Motrin 800 mg every 12 hours for her symptoms with mild relief.  She also notes that she has been having pain where her wisdom tooth is coming in and is scheduled to see a dentist for this.  She denies any gingival swelling or drainage surrounding this.  Patient denies fever, chills, inability to control secretions, N/V, abdominal pain, voice change or trauma. She notes occasional cough in the morning from post nasal drip but otherwise denies cough. No CP or SOB.  HPI  Past Medical History:  Diagnosis Date  . Asthma   . Cannabis hyperemesis syndrome concurrent with and due to cannabis abuse ([place]) 11/09/2016  . Fibroids     Patient Active Problem List   Diagnosis Date Noted  . Vomiting 11/17/2016  . Hypokalemia 11/17/2016  . Cannabis hyperemesis syndrome concurrent with and due to cannabis abuse ([place]) 11/09/2016    Past Surgical History:  Procedure Laterality Date  . NO PAST SURGERIES       OB History    Gravida  0   Para  0   Term  0   Preterm  0   AB  0   Living  0     SAB  0   TAB  0   Ectopic  0   Multiple  0   Live Births  0            Home Medications    Prior to Admission medications   Medication Sig Start Date End Date Taking? Authorizing Provider  acetaminophen (TYLENOL) 325 MG tablet Take 2 tablets (650 mg total) by mouth every 6 (six) hours as needed for mild pain (or Fever >/= 101). Patient not taking: Reported on 07/19/2017 11/19/16   Lavina Hamman, MD  alum & mag hydroxide-simeth (MAALOX/MYLANTA) 200-200-20 MG/5ML suspension Take 15 mLs by mouth every 6 (six) hours as needed for indigestion or heartburn. Patient not taking: Reported on 07/19/2017 11/19/16   Lavina Hamman, MD  amoxicillin (AMOXIL) 500 MG capsule Take 1 capsule (500 mg total) by mouth 2 (two) times daily for 7 days. 05/23/18 05/30/18  Volanda Napoleon, PA-C  aspirin-acetaminophen-caffeine (EXCEDRIN MIGRAINE) [phone] MG tablet Take 2 tablets by mouth once as needed for headache or migraine.    [provider]  ibuprofen (ADVIL,MOTRIN) 200 MG tablet Take 400 mg by mouth daily as needed for moderate pain.    [provider]  methocarbamol (ROBAXIN) 500 MG tablet Take 1 tablet (500 mg total) by mouth 3 (three) times daily. Patient not taking: Reported on 04/04/2017 11/19/16   Posey Pronto,  Josetta Huddle, MD  metroNIDAZOLE (FLAGYL) 500 MG tablet Take 1 tablet (500 mg total) by mouth 2 (two) times daily. Patient not taking: Reported on 04/04/2017 11/19/16   Lavina Hamman, MD  naproxen (NAPROSYN) 375 MG tablet Take 1 tablet (375 mg total) by mouth 2 (two) times daily. 07/19/17   Ocie Cornfield T, PA-C  ondansetron (ZOFRAN ODT) 8 MG disintegrating tablet Take 1 tablet (8 mg total) by mouth every 8 (eight) hours as needed for nausea or vomiting. Patient not taking: Reported on 04/04/2017 11/09/16   Laury Deep, CNM  pantoprazole (PROTONIX) 40 MG tablet Take 1 tablet (40 mg total) by mouth 2 (two) times daily before a meal. 11/19/16 12/02/16  Lavina Hamman, MD  prochlorperazine (COMPAZINE) 25 MG  suppository Place 1 suppository (25 mg total) rectally every 12 (twelve) hours as needed for nausea or vomiting. Patient not taking: Reported on 04/04/2017 11/08/16   Lorin Glass, PA-C  promethazine (PHENERGAN) 25 MG tablet Take 1 tablet (25 mg total) by mouth every 6 (six) hours as needed for nausea or vomiting. 07/19/17   Ocie Cornfield T, PA-C  sucralfate (CARAFATE) 1 GM/10ML suspension Take 10 mLs (1 g total) by mouth 4 (four) times daily -  with meals and at bedtime. Patient not taking: Reported on 04/04/2017 11/19/16   Lavina Hamman, MD  sulfacetamide (BLEPH-10) 10 % ophthalmic solution Place 1-2 drops into the left eye every 4 (four) hours. Patient not taking: Reported on 07/19/2017 04/04/17   Delia Heady, PA-C    Family History Family History  Problem Relation Age of Onset  . Diabetes Mother   . Diabetes Other     Social History Social History   Tobacco Use  . Smoking status: Former Smoker    Types: Cigarettes  . Smokeless tobacco: Never Used  . Tobacco comment: 11/17/2016 "someday smoker when I did smoke"  Substance Use Topics  . Alcohol use: Yes    Comment: 11/17/2016 "might drink 2-3 days/month"  . Drug use: Yes    Types: Marijuana     Allergies   Shrimp [shellfish allergy] and Cabbage   Review of Systems Review of Systems  Constitutional: Negative for fever.  HENT: Positive for dental problem, postnasal drip and sore throat. Negative for ear pain, sinus pressure, sinus pain, trouble swallowing and voice change.   Respiratory: Negative for shortness of breath.   Cardiovascular: Negative for chest pain.  Gastrointestinal: Negative for nausea and vomiting.  Musculoskeletal: Negative for neck stiffness.  Skin: Negative for rash.  Neurological: Negative for headaches.  All other systems reviewed and are negative.    Physical Exam Updated Vital Signs BP (!) 137/94 (BP Location: Left Arm)   Pulse 77   Temp 98.9 F (37.2 C) (Oral)   Resp 18   SpO2  99%   Physical Exam  Constitutional: She appears well-developed and well-nourished. No distress.  HENT:  Head: Normocephalic and atraumatic.  Right Ear: Hearing, tympanic membrane, external ear and ear canal normal. No foreign bodies. No mastoid tenderness. Tympanic membrane is not injected, not perforated, not erythematous, not retracted and not bulging.  Left Ear: Hearing, tympanic membrane, external ear and ear canal normal. No foreign bodies. No mastoid tenderness. Tympanic membrane is not injected, not perforated, not erythematous, not retracted and not bulging.  Nose: Mucosal edema present. No sinus tenderness, septal deviation or nasal septal hematoma.  No foreign bodies. Right sinus exhibits no maxillary sinus tenderness and no frontal sinus tenderness. Left sinus exhibits no  maxillary sinus tenderness and no frontal sinus tenderness.  Mouth/Throat:    No mastoid erythema or edema.  The patient has normal phonation and is in control of secretions. No stridor.  Midline uvula without edema. Soft palate rises symmetrically.Mild tonsillar edema without erythema or exudates. No PTA. Tongue protrusion is normal. No trismus. No creptius on neck palpation and patient has good dentition. No gingival erythema or fluctuance noted.  No floor edema.  Tenderness palpation of her tooth as indicated in diagram.  No obvious cavity or abscess.  Mucus membranes moist.  Eyes: Pupils are equal, round, and reactive to light. Conjunctivae, EOM and lids are normal. Right eye exhibits no discharge. Left eye exhibits no discharge. Right conjunctiva is not injected. Left conjunctiva is not injected.  Neck: Trachea normal, normal range of motion, full passive range of motion without pain and phonation normal. Neck supple. No spinous process tenderness and no muscular tenderness present. No neck rigidity. No tracheal deviation and normal range of motion present.  No nuchal rigidity or meningismus.  No submandibular or  submental edema or lymphadenopathy. No neck crepitus.   Cardiovascular: Normal rate and regular rhythm.  Pulmonary/Chest: Effort normal and breath sounds normal. No stridor. She has no wheezes.  Abdominal: Soft.  Lymphadenopathy:       Head (right side): No submental, no submandibular, no tonsillar, no preauricular, no posterior auricular and no occipital adenopathy present.       Head (left side): No submental, no submandibular, no tonsillar, no preauricular, no posterior auricular and no occipital adenopathy present.    She has cervical adenopathy.  Neurological: She is alert.  Skin: Skin is warm and dry. No rash noted.  Psychiatric: She has a normal mood and affect.  Nursing note and vitals reviewed.    ED Treatments / Results  Labs (all labs ordered are listed, but only abnormal results are displayed) Labs Reviewed - No data to display  EKG None  Radiology No results found.  Procedures Procedures (including critical care time)  Medications Ordered in ED Medications - No data to display   Initial Impression / Assessment and Plan / ED Course  I have reviewed the triage vital signs and the nursing notes.  Pertinent labs & imaging results that were available during my care of the patient were reviewed by me and considered in my medical decision making (see chart for details).     32 y.o. female presenting with dental pain and sore throat.  Patient was seen here on 11/27 for sore throat and treated with amoxicillin.  She has been taking this as prescribed.  She notes she is returning because her symptoms have not resolved.  She still has several doses of the amoxicillin left.  Patient's vital signs are reassuring on presentation.  Patient is in control of her secretions.  No trismus.  Midline uvula without edema.  She does have mild tonsil edema without any erythema or exudates.  There is no evidence of PTA.  No concern for RPA.  No concerns for mastoiditis.  No meningeal signs  on exam.  Patient does have dental pain of her lower right wisdom tooth as indicated in diagram.  There is no evidence of dental infection at this time.  Patient does have follow-up appointment with her dentist in the near future.  She is already being treated with amoxicillin.  I recommended adding on Decadron to her treatment today and continuing her antibiotics and taking Motrin every 8 hours.  Will also give  viscous lidocaine.  Recommended following up with PCP. I will also give dental resources and recommended that patient follow up with her dentist. I have discussed reasons to return to the emergency department.   Patient appears safe for discharge.  Final Clinical Impressions(s) / ED Diagnoses   Final diagnoses:  Sore throat  Pain, dental    ED Discharge Orders         Ordered    lidocaine (XYLOCAINE) 2 % solution  As needed     05/28/18 1232           Lorelle Gibbs 05/28/18 1233    Tegeler, Gwenyth Allegra, MD 05/28/18 [phone]

## 2018-05-28 NOTE — Discharge Instructions (Addendum)
Please read and follow all provided instructions.  Your diagnoses today include:  1. Sore throat   2. Pain, dental     The exam and treatment you received today has been provided on an emergency basis only. This is not a substitute for complete medical or dental care. This problem will not resolve on its own without the care of a dentist.  Tests performed today include: 1. Vital signs. See below for your results today.   Medications prescribed:   Take any prescribed medications only as directed. Use ibuprofen or naproxen for pain. Use the viscous lidocaine for mouth pain. Swish with the lidocaine and spit it out. Do not swallow it.   Please take all of your antibiotics until finished (amoxicillin)!   You may develop abdominal discomfort or diarrhea from the antibiotic.  You may help offset this with probiotics which you can buy or get in yogurt. Do not eat or take the probiotics until 2 hours after your antibiotic. Do not take your medicine if develop an itchy rash, swelling in your mouth or lips, or difficulty breathing.   Home care instructions:  Follow any educational materials contained in this packet.  Follow-up instructions: Please follow-up with your dentist and pcp for further evaluation of your symptoms.   Dental Assistance: See handout for dental referrals  Return instructions:  1. Please return to the Emergency Department if you experience worsening symptoms. 2. Please return if you develop a fever, you develop more swelling in your face or neck, you have trouble breathing or swallowing food.  Your neck becomes stiff.  You drool or are unable to swallow liquids.  You cannot drink or take medicines without vomiting.  You have severe pain that does not go away, even after you take medicine.  You have trouble breathing, and it is not caused by a stuffy nose.  You have new pain and swelling in your joints such as the knees, ankles, wrists, or elbows. 3. Please return  if you have any other emergent concerns.  Additional Information:  Your vital signs today were: BP (!) 137/94 (BP Location: Left Arm)    Pulse 77    Temp 98.9 F (37.2 C) (Oral)    Resp 18    SpO2 99%  If your blood pressure (BP) was elevated above 135/85 this visit, please have this repeated by your doctor within one month. --------------

## 2018-10-31 ENCOUNTER — Encounter (HOSPITAL_COMMUNITY): Payer: Self-pay

## 2018-10-31 ENCOUNTER — Other Ambulatory Visit: Payer: Self-pay

## 2018-10-31 ENCOUNTER — Emergency Department (HOSPITAL_COMMUNITY)
Admission: EM | Admit: 2018-10-31 | Discharge: 2018-10-31 | Disposition: A | Discharge status: Home / Self Care | Payer: Self-pay | Attending: Emergency Medicine | Admitting: Emergency Medicine

## 2018-10-31 ENCOUNTER — Emergency Department (HOSPITAL_COMMUNITY): Payer: Self-pay

## 2018-10-31 DIAGNOSIS — F12288 Cannabis dependence with other cannabis-induced disorder: Secondary | ICD-10-CM | POA: Insufficient documentation

## 2018-10-31 DIAGNOSIS — R197 Diarrhea, unspecified: Principal | ICD-10-CM

## 2018-10-31 DIAGNOSIS — R112 Nausea with vomiting, unspecified: Secondary | ICD-10-CM

## 2018-10-31 DIAGNOSIS — Z87891 Personal history of nicotine dependence: Secondary | ICD-10-CM | POA: Insufficient documentation

## 2018-10-31 DIAGNOSIS — J45909 Unspecified asthma, uncomplicated: Secondary | ICD-10-CM | POA: Insufficient documentation

## 2018-10-31 DIAGNOSIS — D219 Benign neoplasm of connective and other soft tissue, unspecified: Secondary | ICD-10-CM | POA: Insufficient documentation

## 2018-10-31 LAB — URINALYSIS, ROUTINE W REFLEX MICROSCOPIC
Bilirubin Urine: NEGATIVE
Glucose, UA: NEGATIVE mg/dL
Hgb urine dipstick: NEGATIVE
Ketones, ur: 20 mg/dL — AB
Leukocytes,Ua: NEGATIVE
Nitrite: NEGATIVE
Protein, ur: NEGATIVE mg/dL
Specific Gravity, Urine: >1.046 — ABNORMAL HIGH (ref 1.005–1.030)
pH: 7 (ref 5.0–8.0)

## 2018-10-31 LAB — CBC WITH DIFFERENTIAL/PLATELET
Abs Immature Granulocytes: 0.04 10*3/uL (ref 0.00–0.07)
Basophils Absolute: 0 10*3/uL (ref 0.0–0.1)
Basophils Relative: 0 %
Eosinophils Absolute: 0 10*3/uL (ref 0.0–0.5)
Eosinophils Relative: 0 %
HCT: 33.1 % — ABNORMAL LOW (ref 36.0–46.0)
Hemoglobin: 10.7 g/dL — ABNORMAL LOW (ref 12.0–15.0)
Immature Granulocytes: 0 %
Lymphocytes Relative: 25 %
Lymphs Abs: 2.9 10*3/uL (ref 0.7–4.0)
MCH: 29.3 pg (ref 26.0–34.0)
MCHC: 32.3 g/dL (ref 30.0–36.0)
MCV: 90.7 fL (ref 80.0–100.0)
Monocytes Absolute: 1.1 10*3/uL — ABNORMAL HIGH (ref 0.1–1.0)
Monocytes Relative: 10 %
Neutro Abs: 7.5 10*3/uL (ref 1.7–7.7)
Neutrophils Relative %: 65 %
Platelets: 391 10*3/uL (ref 150–400)
RBC: 3.65 MIL/uL — ABNORMAL LOW (ref 3.87–5.11)
RDW: 12.6 % (ref 11.5–15.5)
WBC: 11.6 10*3/uL — ABNORMAL HIGH (ref 4.0–10.5)
nRBC: 0 % (ref 0.0–0.2)

## 2018-10-31 LAB — RAPID URINE DRUG SCREEN, HOSP PERFORMED
Amphetamines: NOT DETECTED
Barbiturates: NOT DETECTED
Benzodiazepines: NOT DETECTED
Cocaine: NOT DETECTED
Opiates: NOT DETECTED
Tetrahydrocannabinol: POSITIVE — AB

## 2018-10-31 LAB — COMPREHENSIVE METABOLIC PANEL
ALT: 16 U/L (ref 0–44)
AST: 20 U/L (ref 15–41)
Albumin: 4.4 g/dL (ref 3.5–5.0)
Alkaline Phosphatase: 52 U/L (ref 38–126)
Anion gap: 9 (ref 5–15)
BUN: 9 mg/dL (ref 6–20)
CO2: 22 mmol/L (ref 22–32)
Calcium: 9.3 mg/dL (ref 8.9–10.3)
Chloride: 106 mmol/L (ref 98–111)
Creatinine, Ser: 0.83 mg/dL (ref 0.44–1.00)
GFR calc Af Amer: >60 mL/min (ref >60)
GFR calc non Af Amer: >60 mL/min (ref >60)
Glucose, Bld: 95 mg/dL (ref 70–99)
Potassium: 3 mmol/L — ABNORMAL LOW (ref 3.5–5.1)
Sodium: 137 mmol/L (ref 135–145)
Total Bilirubin: 1 mg/dL (ref 0.3–1.2)
Total Protein: 8.4 g/dL — ABNORMAL HIGH (ref 6.5–8.1)

## 2018-10-31 LAB — I-STAT BETA HCG BLOOD, ED (MC, WL, AP ONLY): I-stat hCG, quantitative: <5 m[IU]/mL (ref <5)

## 2018-10-31 LAB — LIPASE, BLOOD: Lipase: 26 U/L (ref 11–51)

## 2018-10-31 MED ORDER — PROMETHAZINE HCL 25 MG PO TABS: 25.0000 mg | ORAL_TABLET | Freq: Four times a day (QID) | ORAL | 0 refills | Status: DC | PRN

## 2018-10-31 MED ORDER — LORAZEPAM 2 MG/ML IJ SOLN
1.0000 mg | Freq: Once | INTRAMUSCULAR | Status: AC
  Administered 2018-10-31: 19:00:00 1 mg via INTRAVENOUS
  Filled 2018-10-31: qty 1

## 2018-10-31 MED ORDER — SODIUM CHLORIDE 0.9 % IV BOLUS
1000.0000 mL | Freq: Once | INTRAVENOUS | Status: AC
  Administered 2018-10-31: 1000 mL via INTRAVENOUS

## 2018-10-31 MED ORDER — FENTANYL CITRATE (PF) 100 MCG/2ML IJ SOLN
50.0000 ug | Freq: Once | INTRAMUSCULAR | Status: AC
  Administered 2018-10-31: 50 ug via INTRAVENOUS
  Filled 2018-10-31: qty 2

## 2018-10-31 MED ORDER — POTASSIUM CHLORIDE CRYS ER 20 MEQ PO TBCR
40.0000 meq | EXTENDED_RELEASE_TABLET | Freq: Once | ORAL | Status: AC
  Administered 2018-10-31: 40 meq via ORAL
  Filled 2018-10-31: qty 2

## 2018-10-31 MED ORDER — PROMETHAZINE HCL 25 MG/ML IJ SOLN
12.5000 mg | Freq: Once | INTRAMUSCULAR | Status: AC
  Administered 2018-10-31: 12.5 mg via INTRAVENOUS
  Filled 2018-10-31: qty 1

## 2018-10-31 MED ORDER — ONDANSETRON HCL 4 MG/2ML IJ SOLN
4.0000 mg | Freq: Once | INTRAMUSCULAR | Status: AC
  Administered 2018-10-31: 16:00:00 4 mg via INTRAVENOUS

## 2018-10-31 MED ORDER — IOHEXOL 300 MG/ML  SOLN
100.0000 mL | Freq: Once | INTRAMUSCULAR | Status: AC | PRN
  Administered 2018-10-31: 20:00:00 100 mL via INTRAVENOUS

## 2018-10-31 MED ORDER — LOPERAMIDE HCL 2 MG PO CAPS
4.0000 mg | ORAL_CAPSULE | Freq: Once | ORAL | Status: AC
  Administered 2018-10-31: 4 mg via ORAL
  Filled 2018-10-31: qty 2

## 2018-10-31 NOTE — ED Provider Notes (Signed)
[facility] DEPT Provider Note   CSN: [id number] Arrival date & time: 10/31/18  1532    History   Chief Complaint Chief Complaint  Patient presents with  . Emesis    HPI Carrie Guerra is a 33 y.o. female hx of fibroids who presented with vomiting.  Patient states that she ate some pizza as well as some food from [facility] yesterday around lunchtime.  She states that since then, she has been vomiting.  She states that she is unable to keep anything down.  She also has several episodes of diarrhea as well.  She states that she has chills and subjective fevers.  She denies any recent travel or sick contacts.  Patient denies any history of abdominal surgeries or small bowel obstruction.  She did have a history of cannabis hyperemesis previously but denies any active marijuana use.      The history is provided by the patient.    Past Medical History:  Diagnosis Date  . Asthma   . Cannabis hyperemesis syndrome concurrent with and due to cannabis abuse ([place]) 11/09/2016  . Fibroids     Patient Active Problem List   Diagnosis Date Noted  . Vomiting 11/17/2016  . Hypokalemia 11/17/2016  . Cannabis hyperemesis syndrome concurrent with and due to cannabis abuse ([place]) 11/09/2016    Past Surgical History:  Procedure Laterality Date  . NO PAST SURGERIES       OB History    Gravida  0   Para  0   Term  0   Preterm  0   AB  0   Living  0     SAB  0   TAB  0   Ectopic  0   Multiple  0   Live Births  0            Home Medications    Prior to Admission medications   Medication Sig Start Date End Date Taking? Authorizing Provider  acetaminophen (TYLENOL) 325 MG tablet Take 2 tablets (650 mg total) by mouth every 6 (six) hours as needed for mild pain (or Fever >/= 101). Patient not taking: Reported on 07/19/2017 11/19/16   Lavina Hamman, MD  alum & mag hydroxide-simeth (MAALOX/MYLANTA) 200-200-20 MG/5ML suspension Take 15 mLs by mouth  every 6 (six) hours as needed for indigestion or heartburn. Patient not taking: Reported on 07/19/2017 11/19/16   Lavina Hamman, MD  aspirin-acetaminophen-caffeine [facility] MIGRAINE) [phone] MG tablet Take 2 tablets by mouth once as needed for headache or migraine.    [provider]  ibuprofen (ADVIL,MOTRIN) 200 MG tablet Take 400 mg by mouth daily as needed for moderate pain.    [provider]  lidocaine (XYLOCAINE) 2 % solution Use as directed 15 mLs in the mouth or throat as needed for mouth pain. 05/28/18   Maczis, Barth Kirks, PA-C  methocarbamol (ROBAXIN) 500 MG tablet Take 1 tablet (500 mg total) by mouth 3 (three) times daily. Patient not taking: Reported on 04/04/2017 11/19/16   Lavina Hamman, MD  metroNIDAZOLE (FLAGYL) 500 MG tablet Take 1 tablet (500 mg total) by mouth 2 (two) times daily. Patient not taking: Reported on 04/04/2017 11/19/16   Lavina Hamman, MD  naproxen (NAPROSYN) 375 MG tablet Take 1 tablet (375 mg total) by mouth 2 (two) times daily. 07/19/17   Ocie Cornfield T, PA-C  ondansetron (ZOFRAN ODT) 8 MG disintegrating tablet Take 1 tablet (8 mg total) by mouth every 8 (eight) hours  as needed for nausea or vomiting. Patient not taking: Reported on 04/04/2017 11/09/16   Laury Deep, CNM  pantoprazole (PROTONIX) 40 MG tablet Take 1 tablet (40 mg total) by mouth 2 (two) times daily before a meal. 11/19/16 12/02/16  Lavina Hamman, MD  prochlorperazine (COMPAZINE) 25 MG suppository Place 1 suppository (25 mg total) rectally every 12 (twelve) hours as needed for nausea or vomiting. Patient not taking: Reported on 04/04/2017 11/08/16   Lorin Glass, PA-C  promethazine (PHENERGAN) 25 MG tablet Take 1 tablet (25 mg total) by mouth every 6 (six) hours as needed for nausea or vomiting. 07/19/17   Ocie Cornfield T, PA-C  sucralfate (CARAFATE) 1 GM/10ML suspension Take 10 mLs (1 g total) by mouth 4 (four) times daily -  with meals and at bedtime. Patient not  taking: Reported on 04/04/2017 11/19/16   Lavina Hamman, MD  sulfacetamide (BLEPH-10) 10 % ophthalmic solution Place 1-2 drops into the left eye every 4 (four) hours. Patient not taking: Reported on 07/19/2017 04/04/17   Delia Heady, PA-C    Family History Family History  Problem Relation Age of Onset  . Diabetes Mother   . Diabetes Other     Social History Social History   Tobacco Use  . Smoking status: Former Smoker    Types: Cigarettes  . Smokeless tobacco: Never Used  . Tobacco comment: 11/17/2016 "someday smoker when I did smoke"  Substance Use Topics  . Alcohol use: Yes    Comment: 11/17/2016 "might drink 2-3 days/month"  . Drug use: Yes    Types: Marijuana     Allergies   Shrimp [shellfish allergy] and Cabbage   Review of Systems Review of Systems  Gastrointestinal: Positive for abdominal pain, diarrhea and vomiting.  All other systems reviewed and are negative.    Physical Exam Updated Vital Signs BP 108/69   Pulse 72   Temp (!) 97.3 F (36.3 C) (Oral)   Resp 18   Ht 5' (1.524 m)   Wt 66.7 kg   LMP 10/24/2018   SpO2 99%   BMI 28.71 kg/m   Physical Exam Vitals signs and nursing note reviewed.  HENT:     Left Ear: Tympanic membrane normal.     Nose: Nose normal.     Mouth/Throat:     Mouth: Mucous membranes are dry.  Eyes:     Extraocular Movements: Extraocular movements intact.     Pupils: Pupils are equal, round, and reactive to light.  Neck:     Musculoskeletal: Normal range of motion.  Cardiovascular:     Rate and Rhythm: Normal rate and regular rhythm.     Pulses: Normal pulses.  Pulmonary:     Effort: Pulmonary effort is normal.     Breath sounds: Normal breath sounds.  Abdominal:     General: Abdomen is flat.     Comments: Mild LUQ tenderness   Musculoskeletal: Normal range of motion.  Skin:    General: Skin is warm.     Capillary Refill: Capillary refill takes less than 2 seconds.  Neurological:     General: No focal deficit  present.     Mental Status: She is alert and oriented to person, place, and time.  Psychiatric:        Mood and Affect: Mood normal.        Behavior: Behavior normal.      ED Treatments / Results  Labs (all labs ordered are listed, but only abnormal results are displayed) Labs  Reviewed  CBC WITH DIFFERENTIAL/PLATELET - Abnormal; Notable for the following components:      Result Value   WBC 11.6 (*)    RBC 3.65 (*)    Hemoglobin 10.7 (*)    HCT 33.1 (*)    Monocytes Absolute 1.1 (*)    All other components within normal limits  COMPREHENSIVE METABOLIC PANEL - Abnormal; Notable for the following components:   Potassium 3.0 (*)    Total Protein 8.4 (*)    All other components within normal limits  URINALYSIS, ROUTINE W REFLEX MICROSCOPIC - Abnormal; Notable for the following components:   Color, Urine STRAW (*)    Specific Gravity, Urine >1.046 (*)    Ketones, ur 20 (*)    All other components within normal limits  RAPID URINE DRUG SCREEN, HOSP PERFORMED - Abnormal; Notable for the following components:   Tetrahydrocannabinol POSITIVE (*)    All other components within normal limits  LIPASE, BLOOD  I-STAT BETA HCG BLOOD, ED ([facility], [facility], [facility] ONLY)    EKG None  Radiology Ct Abdomen Pelvis W Contrast  Result Date: 10/31/2018 CLINICAL DATA:  33 y/o  F; nausea and vomiting since last night. EXAM: CT ABDOMEN AND PELVIS WITH CONTRAST TECHNIQUE: Multidetector CT imaging of the abdomen and pelvis was performed using the standard protocol following bolus administration of intravenous contrast. CONTRAST:  139mL OMNIPAQUE IOHEXOL 300 MG/ML  SOLN COMPARISON:  11/14/2016 CT of abdomen and pelvis FINDINGS: Lower chest: No acute abnormality. Hepatobiliary: No focal liver abnormality is seen. No gallstones, gallbladder wall thickening, or biliary dilatation. Pancreas: Unremarkable. No pancreatic ductal dilatation or surrounding inflammatory changes. Spleen: Normal in size without focal abnormality.  Adrenals/Urinary Tract: Adrenal glands are unremarkable. Kidneys are normal, without renal calculi, focal lesion, or hydronephrosis. Bladder is unremarkable. Stomach/Bowel: Stomach is within normal limits. Appendix appears normal. No evidence of bowel wall thickening, distention, or inflammatory changes. Vascular/Lymphatic: No significant vascular findings are present. No enlarged abdominal or pelvic lymph nodes. Reproductive: Anterior uterine fundal myoma measuring up to 4.9 cm. Lower uterine segment exophytic dorsal uterine myoma measuring up to 4.4 cm. Other: No abdominal wall hernia or abnormality. No abdominopelvic ascites. Musculoskeletal: No acute or significant osseous findings. IMPRESSION: 1. No acute process identified. 2. Myomatous uterus. Electronically Signed   By: Kristine Garbe M.D.   On: 10/31/2018 20:31    Procedures Procedures (including critical care time)  Medications Ordered in ED Medications  sodium chloride 0.9 % bolus 1,000 mL (0 mLs Intravenous Stopped 10/31/18 1922)  ondansetron (ZOFRAN) injection 4 mg (4 mg Intravenous Given 10/31/18 1621)  potassium chloride SA (K-DUR) CR tablet 40 mEq (40 mEq Oral Given 10/31/18 1826)  sodium chloride 0.9 % bolus 1,000 mL (1,000 mLs Intravenous Bolus from Bag 10/31/18 1923)  promethazine (PHENERGAN) injection 12.5 mg (12.5 mg Intravenous Given 10/31/18 1837)  loperamide (IMODIUM) capsule 4 mg (4 mg Oral Given 10/31/18 1826)  promethazine (PHENERGAN) injection 12.5 mg (12.5 mg Intravenous Given 10/31/18 1922)  fentaNYL (SUBLIMAZE) injection 50 mcg (50 mcg Intravenous Given 10/31/18 1922)  LORazepam (ATIVAN) injection 1 mg (1 mg Intravenous Given 10/31/18 1922)  iohexol (OMNIPAQUE) 300 MG/ML solution 100 mL (100 mLs Intravenous Contrast Given 10/31/18 2013)     Initial Impression / Assessment and Plan / ED Course  I have reviewed the triage vital signs and the nursing notes.  Pertinent labs & imaging results that were available during my care  of the patient were reviewed by me and considered in my medical decision making (  see chart for details).       Carrie Guerra is a 33 y.o. female here with abdominal pain, vomiting, diarrhea. Likely viral gastro vs food poisoning. Will get labs, UA, UDS (hx of cannabis induced hyperemesis). Will hydrate and reassess.     7 pm Still nauseated. K 3.0, supplemented. Has lower abdominal cramps and some diarrhea. Given imodium, phenergan, zofran. Will get CT ab/pel.   9:26 PM CT unremarkable, just fibroids. She felt better now and tolerated PO. UDS + marijuana. She admits to using marijuana yesterday. I suspect a component of cannabis hyperemesis syndrome. Told her to stop using it. Will dc home with phenergan prn.    Final Clinical Impressions(s) / ED Diagnoses   Final diagnoses:  None    ED Discharge Orders    None       Drenda Freeze, MD 10/31/18 2127

## 2018-10-31 NOTE — ED Triage Notes (Addendum)
Pt states that she had [employer] and pizza last night, and since then she has had emesis. Pt states that she has been unable to keep pedialyte or water down. Pt states she has had the chills, then hot, then chills. Pt states Tylenol at 0500. Pt states >10 episodes of emesis. Pt endorses diarrhea this AM.

## 2018-10-31 NOTE — ED Notes (Addendum)
Patient transported to CT 

## 2018-10-31 NOTE — Discharge Instructions (Signed)
Take phenergan for nausea.   Stay hydrated.   Stop using marijuana as it may have caused you to vomit more   See your doctor  Return to ER if you have worse abdominal pain, vomiting, fever.

## 2018-10-31 NOTE — ED Notes (Signed)
ED Provider at bedside. 

## 2018-12-23 ENCOUNTER — Emergency Department (HOSPITAL_COMMUNITY)
Admission: EM | Admit: 2018-12-23 | Discharge: 2018-12-23 | Disposition: A | Discharge status: Home / Self Care | Payer: BLUE CROSS/BLUE SHIELD | Attending: Emergency Medicine | Admitting: Emergency Medicine

## 2018-12-23 DIAGNOSIS — J45909 Unspecified asthma, uncomplicated: Secondary | ICD-10-CM | POA: Diagnosis not present

## 2018-12-23 DIAGNOSIS — R1084 Generalized abdominal pain: Secondary | ICD-10-CM | POA: Insufficient documentation

## 2018-12-23 DIAGNOSIS — Z87891 Personal history of nicotine dependence: Secondary | ICD-10-CM | POA: Insufficient documentation

## 2018-12-23 DIAGNOSIS — R112 Nausea with vomiting, unspecified: Secondary | ICD-10-CM | POA: Insufficient documentation

## 2018-12-23 DIAGNOSIS — F1290 Cannabis use, unspecified, uncomplicated: Secondary | ICD-10-CM | POA: Diagnosis not present

## 2018-12-23 DIAGNOSIS — R197 Diarrhea, unspecified: Secondary | ICD-10-CM | POA: Insufficient documentation

## 2018-12-23 DIAGNOSIS — Z79899 Other long term (current) drug therapy: Secondary | ICD-10-CM | POA: Insufficient documentation

## 2018-12-23 DIAGNOSIS — R109 Unspecified abdominal pain: Secondary | ICD-10-CM

## 2018-12-23 LAB — URINALYSIS, ROUTINE W REFLEX MICROSCOPIC
Bilirubin Urine: NEGATIVE
Glucose, UA: 50 mg/dL — AB
Hgb urine dipstick: NEGATIVE
Ketones, ur: 80 mg/dL — AB
Leukocytes,Ua: NEGATIVE
Nitrite: NEGATIVE
Protein, ur: 30 mg/dL — AB
Specific Gravity, Urine: 1.023 (ref 1.005–1.030)
pH: 7 (ref 5.0–8.0)

## 2018-12-23 LAB — RAPID URINE DRUG SCREEN, HOSP PERFORMED
Amphetamines: NOT DETECTED
Barbiturates: POSITIVE — AB
Benzodiazepines: NOT DETECTED
Cocaine: NOT DETECTED
Opiates: POSITIVE — AB
Tetrahydrocannabinol: POSITIVE — AB

## 2018-12-23 LAB — I-STAT BETA HCG BLOOD, ED (MC, WL, AP ONLY): I-stat hCG, quantitative: <5 m[IU]/mL (ref <5)

## 2018-12-23 LAB — COMPREHENSIVE METABOLIC PANEL
ALT: 22 U/L (ref 0–44)
AST: 29 U/L (ref 15–41)
Albumin: 4.4 g/dL (ref 3.5–5.0)
Alkaline Phosphatase: 52 U/L (ref 38–126)
Anion gap: 13 (ref 5–15)
BUN: 10 mg/dL (ref 6–20)
CO2: 21 mmol/L — ABNORMAL LOW (ref 22–32)
Calcium: 9.8 mg/dL (ref 8.9–10.3)
Chloride: 106 mmol/L (ref 98–111)
Creatinine, Ser: 0.95 mg/dL (ref 0.44–1.00)
GFR calc Af Amer: >60 mL/min (ref >60)
GFR calc non Af Amer: >60 mL/min (ref >60)
Glucose, Bld: 141 mg/dL — ABNORMAL HIGH (ref 70–99)
Potassium: 3.7 mmol/L (ref 3.5–5.1)
Sodium: 140 mmol/L (ref 135–145)
Total Bilirubin: 0.8 mg/dL (ref 0.3–1.2)
Total Protein: 8.7 g/dL — ABNORMAL HIGH (ref 6.5–8.1)

## 2018-12-23 LAB — CBC WITH DIFFERENTIAL/PLATELET
Abs Immature Granulocytes: 0.04 10*3/uL (ref 0.00–0.07)
Basophils Absolute: 0 10*3/uL (ref 0.0–0.1)
Basophils Relative: 0 %
Eosinophils Absolute: 0 10*3/uL (ref 0.0–0.5)
Eosinophils Relative: 0 %
HCT: 36 % (ref 36.0–46.0)
Hemoglobin: 11.4 g/dL — ABNORMAL LOW (ref 12.0–15.0)
Immature Granulocytes: 1 %
Lymphocytes Relative: 11 %
Lymphs Abs: 0.9 10*3/uL (ref 0.7–4.0)
MCH: 28.6 pg (ref 26.0–34.0)
MCHC: 31.7 g/dL (ref 30.0–36.0)
MCV: 90.2 fL (ref 80.0–100.0)
Monocytes Absolute: 0.4 10*3/uL (ref 0.1–1.0)
Monocytes Relative: 5 %
Neutro Abs: 6.9 10*3/uL (ref 1.7–7.7)
Neutrophils Relative %: 83 %
Platelets: 410 10*3/uL — ABNORMAL HIGH (ref 150–400)
RBC: 3.99 MIL/uL (ref 3.87–5.11)
RDW: 12.7 % (ref 11.5–15.5)
WBC: 8.3 10*3/uL (ref 4.0–10.5)
nRBC: 0 % (ref 0.0–0.2)

## 2018-12-23 LAB — LIPASE, BLOOD: Lipase: 29 U/L (ref 11–51)

## 2018-12-23 MED ORDER — PROCHLORPERAZINE EDISYLATE 10 MG/2ML IJ SOLN
5.0000 mg | Freq: Once | INTRAMUSCULAR | Status: AC
  Administered 2018-12-23: 5 mg via INTRAVENOUS
  Filled 2018-12-23: qty 2

## 2018-12-23 MED ORDER — KETOROLAC TROMETHAMINE 30 MG/ML IJ SOLN
30.0000 mg | Freq: Once | INTRAMUSCULAR | Status: AC
  Administered 2018-12-23: 11:00:00 30 mg via INTRAMUSCULAR
  Filled 2018-12-23: qty 1

## 2018-12-23 MED ORDER — PROMETHAZINE HCL 25 MG/ML IJ SOLN
25.0000 mg | Freq: Once | INTRAMUSCULAR | Status: AC
  Administered 2018-12-23: 25 mg via INTRAVENOUS
  Filled 2018-12-23: qty 1

## 2018-12-23 MED ORDER — LORAZEPAM 1 MG PO TABS
1.0000 mg | ORAL_TABLET | Freq: Once | ORAL | Status: AC
  Administered 2018-12-23: 11:00:00 1 mg via ORAL
  Filled 2018-12-23: qty 1

## 2018-12-23 MED ORDER — CAPSAICIN 0.025 % EX CREA
TOPICAL_CREAM | Freq: Once | CUTANEOUS | Status: AC
  Administered 2018-12-23: 15:00:00 via TOPICAL
  Filled 2018-12-23: qty 60

## 2018-12-23 MED ORDER — PROMETHAZINE HCL 25 MG RE SUPP
25.0000 mg | Freq: Four times a day (QID) | RECTAL | Status: DC | PRN
  Filled 2018-12-23: qty 1

## 2018-12-23 MED ORDER — MORPHINE SULFATE (PF) 4 MG/ML IV SOLN
4.0000 mg | Freq: Once | INTRAVENOUS | Status: AC
  Administered 2018-12-23: 14:00:00 4 mg via INTRAVENOUS
  Filled 2018-12-23 (×2): qty 1

## 2018-12-23 MED ORDER — SODIUM CHLORIDE 0.9 % IV BOLUS
1000.0000 mL | Freq: Once | INTRAVENOUS | Status: AC
  Administered 2018-12-23: 12:00:00 1000 mL via INTRAVENOUS

## 2018-12-23 MED ORDER — ONDANSETRON HCL 4 MG/2ML IJ SOLN
4.0000 mg | Freq: Once | INTRAMUSCULAR | Status: AC
  Administered 2018-12-23: 14:00:00 4 mg via INTRAVENOUS
  Filled 2018-12-23: qty 2

## 2018-12-23 MED ORDER — FAMOTIDINE IN NACL 20-0.9 MG/50ML-% IV SOLN
20.0000 mg | Freq: Once | INTRAVENOUS | Status: AC
  Administered 2018-12-23: 12:00:00 20 mg via INTRAVENOUS
  Filled 2018-12-23: qty 50

## 2018-12-23 MED ORDER — ONDANSETRON HCL 4 MG PO TABS: 4.0000 mg | ORAL_TABLET | Freq: Three times a day (TID) | ORAL | 0 refills | Status: DC | PRN

## 2018-12-23 MED ORDER — DICYCLOMINE HCL 10 MG/ML IM SOLN
20.0000 mg | Freq: Once | INTRAMUSCULAR | Status: AC
  Administered 2018-12-23: 11:00:00 20 mg via INTRAMUSCULAR
  Filled 2018-12-23: qty 2

## 2018-12-23 NOTE — Discharge Instructions (Signed)
You were given a prescription for nausea medication to take if you have continued nausea and vomiting.  Please call your OB/GYN in regards to your pain from your fibroids.  You are also given information to follow-up with a primary care doctor so that you can establish care.  Please make appointment with either of these physicians in the next 5 to 7 days for reevaluation.  Please return to the ER sooner if you have any new or worsening symptoms, or if you have any of the following symptoms:  Abdominal pain that does not go away.  You have a fever.  You keep throwing up (vomiting).  The pain is felt only in portions of the abdomen. Pain in the right side could possibly be appendicitis. In an adult, pain in the left lower portion of the abdomen could be colitis or diverticulitis.  You pass bloody or black tarry stools.  There is bright red blood in the stool.  The constipation stays for more than 4 days.  There is belly (abdominal) or rectal pain.  You do not seem to be getting better.  You have any questions or concerns.

## 2018-12-23 NOTE — ED Provider Notes (Signed)
[facility] EMERGENCY DEPARTMENT Provider Note   CSN: [id number] Arrival date & time: 12/23/18  1015    History   Chief Complaint Chief Complaint  Patient presents with  . Abdominal Pain    HPI Carrie Guerra is a 33 y.o. female.     HPI   Patient is a 34 year old female with a history of asthma, cannabis hyperemesis syndrome, fibroids, who presents the emergency department today for evaluation of abdominal pain.  Patient states abdominal pain started around 4 AM.  Has been constant since onset.  She states it feels similar to prior episodes abdominal pain.  States she was told in the past that her abdominal pain could be due to fibroids or her marijuana use.  She does admit to marijuana use daily and last use last night.  Reports associated nausea, vomiting and diarrhea.  She also notes that she ate out at a restaurant last night and had chicken wings that she feels like her undercooked.  She denies any fevers or urinary complaints.  Denies exacerbating or alleviating factor.  Denies history of abdominal surgeries.  Reviewed records.  Patient has been seen many times in the ER for cannabis hyperemesis syndrome and pelvic pain from her uterine fibroids.  She most recently was seen about a month ago where she had a CT scan of the abdomen/pelvis showing only uterine fibroids.  Reviewed prior ultrasound reports and patient has no history of ovarian cysts.  Past Medical History:  Diagnosis Date  . Asthma   . 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 Fibroids     Patient Active Problem List   Diagnosis Date Noted  . Vomiting 11/17/2016  . Hypokalemia 11/17/2016  . Cannabis hyperemesis syndrome concurrent with and due to cannabis abuse ([place]) 11/09/2016    Past Surgical History:  Procedure Laterality Date  . NO PAST SURGERIES       OB History    Gravida  0   Para  0   Term  0   Preterm  0   AB  0   Living  0     SAB  0   TAB  0   Ectopic  0   Multiple  0   Live Births  0            Home Medications    Prior to Admission medications   Medication Sig Start Date End Date Taking? Authorizing Provider  acetaminophen (TYLENOL) 325 MG tablet Take 2 tablets (650 mg total) by mouth every 6 (six) hours as needed for mild pain (or Fever >/= 101). Patient not taking: Reported on 07/19/2017 11/19/16   Lavina Hamman, MD  alum & mag hydroxide-simeth (MAALOX/MYLANTA) 200-200-20 MG/5ML suspension Take 15 mLs by mouth every 6 (six) hours as needed for indigestion or heartburn. Patient not taking: Reported on 07/19/2017 11/19/16   Lavina Hamman, MD  aspirin-acetaminophen-caffeine [facility] MIGRAINE) [phone] MG tablet Take 2 tablets by mouth once as needed for headache or migraine.    [provider]  ibuprofen (ADVIL,MOTRIN) 200 MG tablet Take 400 mg by mouth daily as needed for moderate pain.    [provider]  lidocaine (XYLOCAINE) 2 % solution Use as directed 15 mLs in the mouth or throat as needed for mouth pain. 05/28/18   Maczis, Barth Kirks, PA-C  methocarbamol (ROBAXIN) 500 MG tablet Take 1 tablet (500 mg total) by mouth 3 (three) times daily. Patient not taking:  Reported on 04/04/2017 11/19/16   Lavina Hamman, MD  metroNIDAZOLE (FLAGYL) 500 MG tablet Take 1 tablet (500 mg total) by mouth 2 (two) times daily. Patient not taking: Reported on 04/04/2017 11/19/16   Lavina Hamman, MD  naproxen (NAPROSYN) 375 MG tablet Take 1 tablet (375 mg total) by mouth 2 (two) times daily. 07/19/17   Ocie Cornfield T, PA-C  ondansetron (ZOFRAN ODT) 8 MG disintegrating tablet Take 1 tablet (8 mg total) by mouth every 8 (eight) hours as needed for nausea or vomiting. Patient not taking: Reported on 04/04/2017 11/09/16   Laury Deep, CNM  ondansetron (ZOFRAN) 4 MG tablet Take 1 tablet (4 mg total) by mouth every 8 (eight) hours as needed for nausea or vomiting. 12/23/18   Claretta Kendra,  Betsy Rosello S, PA-C  pantoprazole (PROTONIX) 40 MG tablet Take 1 tablet (40 mg total) by mouth 2 (two) times daily before a meal. 11/19/16 12/02/16  Lavina Hamman, MD  prochlorperazine (COMPAZINE) 25 MG suppository Place 1 suppository (25 mg total) rectally every 12 (twelve) hours as needed for nausea or vomiting. Patient not taking: Reported on 04/04/2017 11/08/16   Lorin Glass, PA-C  promethazine (PHENERGAN) 25 MG tablet Take 1 tablet (25 mg total) by mouth every 6 (six) hours as needed for nausea or vomiting. 10/31/18   Drenda Freeze, MD  sucralfate (CARAFATE) 1 GM/10ML suspension Take 10 mLs (1 g total) by mouth 4 (four) times daily -  with meals and at bedtime. Patient not taking: Reported on 04/04/2017 11/19/16   Lavina Hamman, MD  sulfacetamide (BLEPH-10) 10 % ophthalmic solution Place 1-2 drops into the left eye every 4 (four) hours. Patient not taking: Reported on 07/19/2017 04/04/17   Delia Heady, PA-C    Family History Family History  Problem Relation Age of Onset  . Diabetes Mother   . Diabetes Other     Social History Social History   Tobacco Use  . Smoking status: Former Smoker    Types: Cigarettes  . Smokeless tobacco: Never Used  . Tobacco comment: 11/17/2016 "someday smoker when I did smoke"  Substance Use Topics  . Alcohol use: Yes    Comment: 11/17/2016 "might drink 2-3 days/month"  . Drug use: Yes    Types: Marijuana     Allergies   Shrimp [shellfish allergy] and Cabbage   Review of Systems Review of Systems  Constitutional: Negative for fever.  HENT: Negative for ear pain and sore throat.   Eyes: Negative for visual disturbance.  Respiratory: Negative for cough and shortness of breath.   Cardiovascular: Negative for chest pain.  Gastrointestinal: Positive for abdominal pain, diarrhea, nausea and vomiting.  Genitourinary: Negative for dysuria and hematuria.  Musculoskeletal: Negative for back pain.  Skin: Negative for rash.  Neurological: Negative  for seizures and syncope.  All other systems reviewed and are negative.    Physical Exam Updated Vital Signs BP 140/88 (BP Location: Right Arm)   Pulse 88   Temp 98.7 F (37.1 C) (Oral)   Resp 15   SpO2 100%   Physical Exam Vitals signs and nursing note reviewed.  Constitutional:      General: She is not in acute distress.    Appearance: She is well-developed.  HENT:     Head: Normocephalic and atraumatic.  Eyes:     Conjunctiva/sclera: Conjunctivae normal.  Neck:     Musculoskeletal: Neck supple.  Cardiovascular:     Rate and Rhythm: Normal rate and regular rhythm.  Heart sounds: No murmur.  Pulmonary:     Effort: Pulmonary effort is normal. No respiratory distress.     Breath sounds: Normal breath sounds.  Abdominal:     General: Bowel sounds are normal.     Palpations: Abdomen is soft.     Tenderness: There is abdominal tenderness in the left upper quadrant and left lower quadrant. There is no right CVA tenderness, left CVA tenderness, guarding or rebound.     Comments: Vomiting on exam  Skin:    General: Skin is warm and dry.  Neurological:     Mental Status: She is alert.      ED Treatments / Results  Labs (all labs ordered are listed, but only abnormal results are displayed) Labs Reviewed  CBC WITH DIFFERENTIAL/PLATELET - Abnormal; Notable for the following components:      Result Value   Hemoglobin 11.4 (*)    Platelets 410 (*)    All other components within normal limits  COMPREHENSIVE METABOLIC PANEL - Abnormal; Notable for the following components:   CO2 21 (*)    Glucose, Bld 141 (*)    Total Protein 8.7 (*)    All other components within normal limits  URINALYSIS, ROUTINE W REFLEX MICROSCOPIC - Abnormal; Notable for the following components:   APPearance HAZY (*)    Glucose, UA 50 (*)    Ketones, ur 80 (*)    Protein, ur 30 (*)    Bacteria, UA RARE (*)    All other components within normal limits  RAPID URINE DRUG SCREEN, HOSP PERFORMED  - Abnormal; Notable for the following components:   Opiates POSITIVE (*)    Tetrahydrocannabinol POSITIVE (*)    Barbiturates POSITIVE (*)    All other components within normal limits  LIPASE, BLOOD  I-STAT BETA HCG BLOOD, ED ([facility], [facility], [facility] ONLY)    EKG None  Radiology No results found.  Procedures Procedures (including critical care time)  Medications Ordered in ED Medications  promethazine (PHENERGAN) suppository 25 mg (has no administration in time range)  dicyclomine (BENTYL) injection 20 mg (20 mg Intramuscular Given 12/23/18 1129)  capsaicin (ZOSTRIX) 0.025 % cream ( Topical Given 12/23/18 1437)  ketorolac (TORADOL) 30 MG/ML injection 30 mg (30 mg Intramuscular Given 12/23/18 1129)  sodium chloride 0.9 % bolus 1,000 mL (0 mLs Intravenous Stopped 12/23/18 1439)  famotidine (PEPCID) IVPB 20 mg premix (0 mg Intravenous Stopped 12/23/18 1220)  LORazepam (ATIVAN) tablet 1 mg (1 mg Oral Given 12/23/18 1129)  promethazine (PHENERGAN) injection 25 mg (25 mg Intravenous Given 12/23/18 1212)  morphine 4 MG/ML injection 4 mg (4 mg Intravenous Given 12/23/18 1339)  ondansetron (ZOFRAN) injection 4 mg (4 mg Intravenous Given 12/23/18 1334)  prochlorperazine (COMPAZINE) injection 5 mg (5 mg Intravenous Given 12/23/18 1455)     Initial Impression / Assessment and Plan / ED Course  I have reviewed the triage vital signs and the nursing notes.  Pertinent labs & imaging results that were available during my care of the patient were reviewed by me and considered in my medical decision making (see chart for details).     Final Clinical Impressions(s) / ED Diagnoses   Final diagnoses:  Abdominal pain, unspecified abdominal location  Nausea vomiting and diarrhea   33 year old female with a history of cannabis hyperemesis syndrome, uterine fibroids, presenting to the emergency department today complaining of abdominal pain, nausea, vomiting and diarrhea.  States feels similar to prior episodes of  abdominal pain.  Also notes that she ate  some chicken last night that she thinks is undercooked.  CBC is without leukocytosis.  Anemia present but improving from prior. CMP with slightly low bicarb.  Normal electrolytes.  Normal liver and kidney function. Lipase negative Beta hcg negative UA without evidence of UTI UDS + for opiates (given in ED), THC, and barbituates  1:13 PM Reassessed patient.  She states her pain is improving and she now rates it a 6/10.  It is located only to the left upper quadrant.  She has minimal tenderness on exam without guarding or rebound tenderness.  She is complaining of continued nausea.  Reassessed patient multiple times.  Each time she is sleeping comfortably in bed.  On final reassessment patient states that her abdominal pain has completely improved and that her nausea is finally gone.  She has been able to tolerate water in the ED.  I discussed the possible diagnosis of viral gastroenteritis versus cannabis hyperemesis versus pain from her fibroids.  Advised that she needs to follow-up with her OB/GYN.  I will also give her follow-up with PCP.  Advised on specific return precautions.  Will give Rx for Zofran.  She voices understanding and is in agreement with plan.  All questions answered.  Patient stable discharge.  ED Discharge Orders         Ordered    ondansetron (ZOFRAN) 4 MG tablet  Every 8 hours PRN     12/23/18 1522           Rodney Booze, PA-C 12/23/18 1600    Isla Pence, MD 12/24/18 1449

## 2018-12-23 NOTE — ED Notes (Signed)
Please Call Clarisa Schools (Brother) for information and updates. The other brother listed on contacts is out of town.

## 2018-12-23 NOTE — ED Notes (Signed)
Pt verbalized discharge instructions and follow up care. IV removed and bleeding controlled. Pt stated she would update her brother who called.  Pt ambulated to lobby with steady gait.

## 2018-12-23 NOTE — ED Triage Notes (Addendum)
Patient complains of stomach pain and N/V/D since 4 am. The pain is located in her LLQ and LRQ. Patient has had 2 episodes of diarrhea and lots of vomiting episodes. Patient is currently crying in pain and vomiting.

## 2019-04-18 ENCOUNTER — Emergency Department (HOSPITAL_COMMUNITY)
Admission: EM | Admit: 2019-04-18 | Discharge: 2019-04-18 | Disposition: A | Discharge status: Home / Self Care | Payer: BLUE CROSS/BLUE SHIELD | Attending: Emergency Medicine | Admitting: Emergency Medicine

## 2019-04-18 ENCOUNTER — Encounter (HOSPITAL_COMMUNITY): Payer: Self-pay | Admitting: Emergency Medicine

## 2019-04-18 ENCOUNTER — Other Ambulatory Visit: Payer: Self-pay

## 2019-04-18 DIAGNOSIS — Z791 Long term (current) use of non-steroidal anti-inflammatories (NSAID): Secondary | ICD-10-CM | POA: Insufficient documentation

## 2019-04-18 DIAGNOSIS — B9689 Other specified bacterial agents as the cause of diseases classified elsewhere: Secondary | ICD-10-CM

## 2019-04-18 DIAGNOSIS — Z20822 Contact with and (suspected) exposure to covid-19: Secondary | ICD-10-CM

## 2019-04-18 DIAGNOSIS — Z20828 Contact with and (suspected) exposure to other viral communicable diseases: Secondary | ICD-10-CM | POA: Insufficient documentation

## 2019-04-18 DIAGNOSIS — Z87891 Personal history of nicotine dependence: Secondary | ICD-10-CM | POA: Insufficient documentation

## 2019-04-18 DIAGNOSIS — Z760 Encounter for issue of repeat prescription: Secondary | ICD-10-CM | POA: Insufficient documentation

## 2019-04-18 DIAGNOSIS — N760 Acute vaginitis: Secondary | ICD-10-CM

## 2019-04-18 DIAGNOSIS — J45909 Unspecified asthma, uncomplicated: Secondary | ICD-10-CM | POA: Insufficient documentation

## 2019-04-18 LAB — URINALYSIS, ROUTINE W REFLEX MICROSCOPIC
Bilirubin Urine: NEGATIVE
Glucose, UA: NEGATIVE mg/dL
Hgb urine dipstick: NEGATIVE
Ketones, ur: 5 mg/dL — AB
Leukocytes,Ua: NEGATIVE
Nitrite: NEGATIVE
Protein, ur: NEGATIVE mg/dL
Specific Gravity, Urine: 1.032 — ABNORMAL HIGH (ref 1.005–1.030)
pH: 5 (ref 5.0–8.0)

## 2019-04-18 LAB — WET PREP, GENITAL
Sperm: NONE SEEN
Trich, Wet Prep: NONE SEEN
Yeast Wet Prep HPF POC: NONE SEEN

## 2019-04-18 LAB — RPR: RPR Ser Ql: NONREACTIVE

## 2019-04-18 LAB — POC URINE PREG, ED: Preg Test, Ur: NEGATIVE

## 2019-04-18 LAB — HIV ANTIBODY (ROUTINE TESTING W REFLEX): HIV Screen 4th Generation wRfx: NONREACTIVE

## 2019-04-18 LAB — SARS CORONAVIRUS 2 (TAT 6-24 HRS): SARS Coronavirus 2: NEGATIVE

## 2019-04-18 MED ORDER — METRONIDAZOLE 500 MG PO TABS: 500.0000 mg | ORAL_TABLET | Freq: Two times a day (BID) | ORAL | 0 refills | Status: DC

## 2019-04-18 NOTE — ED Triage Notes (Signed)
Pt in with nasal congestion, cough and chills since waking yesterday morning. States productive clear sputum. Concerned she may have contracted Covid from coworkers. Also wants to be checked for syphillis, possible recent exposure

## 2019-04-18 NOTE — Discharge Instructions (Addendum)
Thank you for allowing [place] to care for you today.   A prescription has been sent to the pharmacy for Flagyl.  This is an antibiotic used to treat bacterial vaginosis.  You cannot drink alcohol while taking this antibiotic as it will make you very sick.  -If your results for your STD testing today come back positive you will be notified and you will need to seek treatment at the health department or with your OB/GYN doctor.  Please return to the emergency department if you have any new or worsening symptoms.  You were tested for COVID today.  IF the result comes back positive I have included instructions for you below. IF the test is negative you can ignore the instructions and continue to treat your symptoms like antother possible viral infection which includes symptomatic are as we discussed.  Medications- You can take medications to help treat your symptoms: -Tylenol for fever and body aches. Please take as prescribed on the bottle. -Over the coutner cough medicine such as mucinex, robitussin, or other brands.  Treatment- This is a virus and unfortunately there are no antibitotics approved to treat this virus at this time. It is important to monitor your symptoms closely: -You should have a theremometer at home to check your temperature when feeling feverish. -Use a pulse ox meter to measure your oxygen when feeling short of breath.  -If your fever is over 100.4 despite taking tylenol or if your oxygen level drops below 94% these are reasons to rturn to the emergency department for further evaluation. Please call the emergency department before you come to make [place] aware.    We recommend you self-isolate for 10 days and to inform your work/family/friends that you has the virus.  They will need to self-quarantine for 14 days to monitor for symptoms.    Again: symptoms of shortnessf breath, chest pain, difficulty breathing, new onset of confuison, any symptoms that are concerning. And you or the  person should come to emergency department for evaluation.   I hope you feel better soon

## 2019-04-18 NOTE — ED Provider Notes (Signed)
[facility] EMERGENCY DEPARTMENT Provider Note   CSN: [id number] Arrival date & time: 04/18/19  [id number]     History   Chief Complaint Chief Complaint  Patient presents with  . Nasal Congestion  . Cough  . Chills    HPI Carrie Guerra is a 33 y.o. female with past medical history signifiacnt for asthma, cannabis hyperemesis syndrome, fibroids presenting to emergency department today with multiple chief complaints.  She is concerned she was exposed to covid from her coworkers. She recently started a new job and was told that 5 coworkers recently had covid. She is reporting cough and nasal congestion x 2 days. She states cough is productive with clear phlegm. Her nose feels congested and she has been blowing it more often that usual. She also reports sore throat after long coughing episodes. She reports subjective fever but has not measured her temperature. She has been taking tylenol, theraflu, and cough drops with transient symptom relief.  She is also concerned about syphilis as a friend recently tested positive. She has never been sexually active with this person before but often shares cigarettes with them. She denies history of STI. She has not been sexually active in 3 months. Also denies sinus pressure, ear pain, trouble swallowing, eye pain, shortness of breath, wheezing, chest pain, abdominal pain, nausea, vomiting, rash, pelvic pain, vaginal discharge.    Past Medical History:  Diagnosis Date  . Asthma   . Cannabis hyperemesis syndrome concurrent with and due to cannabis abuse ([place]) 11/09/2016  . Fibroids     Patient Active Problem List   Diagnosis Date Noted  . Vomiting 11/17/2016  . Hypokalemia 11/17/2016  . 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 NO PAST SURGERIES       OB History    Gravida  0   Para  0   Term  0   Preterm  0   AB   0   Living  0     SAB  0   TAB  0   Ectopic  0   Multiple  0   Live Births  0            Home Medications    Prior to Admission medications   Medication Sig Start Date End Date Taking? Authorizing Provider  acetaminophen (TYLENOL) 325 MG tablet Take 2 tablets (650 mg total) by mouth every 6 (six) hours as needed for mild pain (or Fever >/= 101). Patient not taking: Reported on 07/19/2017 11/19/16   Lavina Hamman, MD  alum & mag hydroxide-simeth (MAALOX/MYLANTA) 200-200-20 MG/5ML suspension Take 15 mLs by mouth every 6 (six) hours as needed for indigestion or heartburn. Patient not taking: Reported on 07/19/2017 11/19/16   Lavina Hamman, MD  aspirin-acetaminophen-caffeine [facility] MIGRAINE) [phone] MG tablet Take 2 tablets by mouth once as needed for headache or migraine.    [provider]  ibuprofen (ADVIL,MOTRIN) 200 MG tablet Take 400 mg by mouth daily as needed for moderate pain.    [provider]  lidocaine (XYLOCAINE) 2 % solution Use as directed 15 mLs in the mouth or throat as needed for mouth pain. 05/28/18   Maczis, Barth Kirks, PA-C  methocarbamol (ROBAXIN) 500 MG tablet Take 1 tablet (500 mg total) by mouth 3 (three) times daily. Patient not taking: Reported on 04/04/2017 11/19/16   Lavina Hamman, MD  metroNIDAZOLE (FLAGYL)  500 MG tablet Take 1 tablet (500 mg total) by mouth 2 (two) times daily. 04/18/19   ,  E, PA-C  naproxen (NAPROSYN) 375 MG tablet Take 1 tablet (375 mg total) by mouth 2 (two) times daily. 07/19/17   Ocie Cornfield T, PA-C  ondansetron (ZOFRAN ODT) 8 MG disintegrating tablet Take 1 tablet (8 mg total) by mouth every 8 (eight) hours as needed for nausea or vomiting. Patient not taking: Reported on 04/04/2017 11/09/16   Laury Deep, CNM  ondansetron (ZOFRAN) 4 MG tablet Take 1 tablet (4 mg total) by mouth every 8 (eight) hours as needed for nausea or vomiting. 12/23/18   Couture, Cortni S, PA-C  pantoprazole  (PROTONIX) 40 MG tablet Take 1 tablet (40 mg total) by mouth 2 (two) times daily before a meal. 11/19/16 12/02/16  Lavina Hamman, MD  prochlorperazine (COMPAZINE) 25 MG suppository Place 1 suppository (25 mg total) rectally every 12 (twelve) hours as needed for nausea or vomiting. Patient not taking: Reported on 04/04/2017 11/08/16   Lorin Glass, PA-C  promethazine (PHENERGAN) 25 MG tablet Take 1 tablet (25 mg total) by mouth every 6 (six) hours as needed for nausea or vomiting. 10/31/18   Drenda Freeze, MD  sucralfate (CARAFATE) 1 GM/10ML suspension Take 10 mLs (1 g total) by mouth 4 (four) times daily -  with meals and at bedtime. Patient not taking: Reported on 04/04/2017 11/19/16   Lavina Hamman, MD  sulfacetamide (BLEPH-10) 10 % ophthalmic solution Place 1-2 drops into the left eye every 4 (four) hours. Patient not taking: Reported on 07/19/2017 04/04/17   Delia Heady, PA-C    Family History Family History  Problem Relation Age of Onset  . Diabetes Mother   . Diabetes Other     Social History Social History   Tobacco Use  . Smoking status: Former Smoker    Types: Cigarettes  . Smokeless tobacco: Never Used  . Tobacco comment: 11/17/2016 "someday smoker when I did smoke"  Substance Use Topics  . Alcohol use: Yes    Comment: 11/17/2016 "might drink 2-3 days/month"  . Drug use: Yes    Types: Marijuana     Allergies   Shrimp [shellfish allergy] and Cabbage   Review of Systems Review of Systems  Constitutional: Positive for fever.  HENT: Positive for congestion and sore throat. Negative for ear discharge, ear pain, facial swelling, mouth sores, sinus pressure, sinus pain, trouble swallowing and voice change.   Eyes: Negative for pain, discharge, redness, itching and visual disturbance.  Respiratory: Positive for cough. Negative for shortness of breath and wheezing.   Cardiovascular: Negative for chest pain.  Gastrointestinal: Negative for abdominal pain, nausea and  vomiting.  Genitourinary: Negative for dyspareunia, genital sores, hematuria, pelvic pain, vaginal bleeding, vaginal discharge and vaginal pain.  Skin: Negative for rash and wound.     Physical Exam Updated Vital Signs BP 136/86 (BP Location: Left Arm)   Pulse 75   Temp 98.6 F (37 C) (Oral)   Resp 14   Wt 66.7 kg   LMP 03/25/2019   SpO2 100%   BMI 28.72 kg/m   Physical Exam Vitals signs and nursing note reviewed.  Constitutional:      Appearance: She is well-developed. She is not ill-appearing or toxic-appearing.  HENT:     Head: Normocephalic and atraumatic.     Nose: Nose normal.     Mouth/Throat:     Comments: No erythema to oropharynx, no edema, no exudate, no tonsillar  swelling, voice normal, neck supple without lymphadenopathy  Eyes:     General: No scleral icterus.       Right eye: No discharge.        Left eye: No discharge.     Extraocular Movements: Extraocular movements intact.     Conjunctiva/sclera: Conjunctivae normal.     Pupils: Pupils are equal, round, and reactive to light.  Neck:     Musculoskeletal: Normal range of motion.     Vascular: No JVD.     Comments: Full ROM intact without spinous process TTP. No bony stepoffs or deformities, no paraspinous muscle TTP or muscle spasms. No rigidity or meningeal signs. No bruising, erythema, or swelling.  Cardiovascular:     Rate and Rhythm: Normal rate and regular rhythm.     Pulses: Normal pulses.     Heart sounds: Normal heart sounds.  Pulmonary:     Effort: Pulmonary effort is normal.     Breath sounds: Normal breath sounds.     Comments: Lungs are clear to auscultation in all fields. Normal work of breathing. No wheeze, rales, or rhonchi heard. SpO2is 100% on room air during exam. Speaking in full sentences, no accessory muscle use. Abdominal:     General: There is no distension.  Genitourinary:    Comments: Normal external genitalia. No pain with speculum insertion. Closed cervical os with normal  appearance - no rash or lesions. No significant discharge or bleeding noted from cervix/ Thin white discharge in vaginal vault.  On bimanual examination no adnexal tenderness or cervical motion tenderness. Chaperone Jane EMT present during exam.  Musculoskeletal: Normal range of motion.  Lymphadenopathy:     Cervical: No cervical adenopathy.  Skin:    General: Skin is warm and dry.     Capillary Refill: Capillary refill takes less than 2 seconds.     Findings: No rash.  Neurological:     Mental Status: She is oriented to person, place, and time.     GCS: GCS eye subscore is 4. GCS verbal subscore is 5. GCS motor subscore is 6.     Comments: Fluent speech, no facial droop.  Psychiatric:        Behavior: Behavior normal.      ED Treatments / Results  Labs (all labs ordered are listed, but only abnormal results are displayed) Labs Reviewed  WET PREP, GENITAL - Abnormal; Notable for the following components:      Result Value   Clue Cells Wet Prep HPF POC PRESENT (*)    WBC, Wet Prep HPF POC MODERATE (*)    All other components within normal limits  URINALYSIS, ROUTINE W REFLEX MICROSCOPIC - Abnormal; Notable for the following components:   APPearance HAZY (*)    Specific Gravity, Urine 1.032 (*)    Ketones, ur 5 (*)    All other components within normal limits  SARS CORONAVIRUS 2 (TAT 6-24 HRS)  RPR  HIV ANTIBODY (ROUTINE TESTING W REFLEX)  POC URINE PREG, ED  GC/CHLAMYDIA PROBE AMP ([place]) NOT AT [facility]    EKG None  Radiology No results found.  Procedures Procedures (including critical care time)  Medications Ordered in ED Medications - No data to display   Initial Impression / Assessment and Plan / ED Course  I have reviewed the triage vital signs and the nursing notes.  Pertinent labs & imaging results that were available during my care of the patient were reviewed by me and considered in my medical decision making (see  chart for details).  Patient seen  and examined. Patient nontoxic appearing, in no apparent distress. She is afebrile, well appearing. Lungs are clear to auscultation in all fields. No hypoxia, she is speaking in full sentences. No erythema or tonsillar exudate to suggest strep. Centor score of 0.   Abdomen is non tender, no peritoneal signs. Engaged in shared decision making and pt does not want chest xray today. This is reasonable given her lungs being clear, normal work of breathing and no hypoxia. Covid test performed. Pt aware she will need to self quarantine until she has the results. She is concerned for syphilis but has no findings to suggest that on exam, no rash, lesions, chancre. Pelvic exam performed with STI testing. Pt does not want prophylactic treatment as she is not concerned for GC and has not been sexually active in 3 months. She is aware she will be notified if positive and will need to seek treatment and notify partners. Wet prep is positive for bacterial vaginosis with clue cells and WBCs.  Discussing results with patient she admits to seeing thin white vaginal discharge in the last week which is similar to when she has had BV in the past. Will discharge with prescription for Flagyl for BV. The patient appears reasonably screened and/or stabilized for discharge and I doubt any other medical condition or other [facility] requiring further screening, evaluation, or treatment in the ED at this time prior to discharge. The patient is safe for discharge with strict return precautions discussed. Recommend pcp follow up.  Portions of this note were generated with [employer]. Dictation errors may occur despite best attempts at proofreading.      Carrie Guerra was evaluated in Emergency Department on 04/18/2019 for the symptoms described in the history of present illness. She was evaluated in the context of the global COVID-19 pandemic, which necessitated consideration that the patient might be at risk for infection  with the SARS-CoV-2 virus that causes COVID-19. Institutional protocols and algorithms that pertain to the evaluation of patients at risk for COVID-19 are in a state of rapid change based on information released by regulatory bodies including the CDC and federal and state organizations. These policies and algorithms were followed during the patient's care in the ED.   Final Clinical Impressions(s) / ED Diagnoses   Final diagnoses:  Bacterial vaginosis  Exposure to COVID-19 virus    ED Discharge Orders         Ordered    metroNIDAZOLE (FLAGYL) 500 MG tablet  2 times daily     04/18/19 1017           Flint Melter 04/18/19 1034    Davonna Belling, MD 04/18/19 [phone]

## 2019-04-18 NOTE — ED Notes (Signed)
Patient verbalizes understanding of discharge instructions. Opportunity for questioning and answers were provided. Armband removed by staff, pt discharged from ED.  

## 2019-04-19 LAB — GC/CHLAMYDIA PROBE AMP (~~LOC~~) NOT AT ARMC
Chlamydia: NEGATIVE
Neisseria Gonorrhea: NEGATIVE

## 2019-06-21 ENCOUNTER — Emergency Department (HOSPITAL_COMMUNITY)
Admission: EM | Admit: 2019-06-21 | Discharge: 2019-06-21 | Disposition: A | Discharge status: Home / Self Care | Payer: 59 | Attending: Emergency Medicine | Admitting: Emergency Medicine

## 2019-06-21 ENCOUNTER — Encounter (HOSPITAL_COMMUNITY): Payer: Self-pay

## 2019-06-21 ENCOUNTER — Other Ambulatory Visit: Payer: Self-pay

## 2019-06-21 ENCOUNTER — Encounter (HOSPITAL_COMMUNITY): Payer: Self-pay | Admitting: Emergency Medicine

## 2019-06-21 ENCOUNTER — Emergency Department (HOSPITAL_COMMUNITY)
Admission: EM | Admit: 2019-06-21 | Discharge: 2019-06-22 | Disposition: A | Discharge status: Home / Self Care | Payer: 59 | Source: Home / Self Care | Attending: Emergency Medicine | Admitting: Emergency Medicine

## 2019-06-21 DIAGNOSIS — Z5321 Procedure and treatment not carried out due to patient leaving prior to being seen by health care provider: Secondary | ICD-10-CM | POA: Diagnosis not present

## 2019-06-21 DIAGNOSIS — R1115 Cyclical vomiting syndrome unrelated to migraine: Secondary | ICD-10-CM

## 2019-06-21 DIAGNOSIS — Z79899 Other long term (current) drug therapy: Secondary | ICD-10-CM | POA: Insufficient documentation

## 2019-06-21 DIAGNOSIS — R809 Proteinuria, unspecified: Secondary | ICD-10-CM

## 2019-06-21 DIAGNOSIS — Z7982 Long term (current) use of aspirin: Secondary | ICD-10-CM | POA: Insufficient documentation

## 2019-06-21 DIAGNOSIS — J45909 Unspecified asthma, uncomplicated: Secondary | ICD-10-CM | POA: Insufficient documentation

## 2019-06-21 DIAGNOSIS — Z87891 Personal history of nicotine dependence: Secondary | ICD-10-CM | POA: Insufficient documentation

## 2019-06-21 DIAGNOSIS — R1084 Generalized abdominal pain: Secondary | ICD-10-CM

## 2019-06-21 DIAGNOSIS — F1210 Cannabis abuse, uncomplicated: Secondary | ICD-10-CM | POA: Insufficient documentation

## 2019-06-21 DIAGNOSIS — R109 Unspecified abdominal pain: Secondary | ICD-10-CM | POA: Insufficient documentation

## 2019-06-21 LAB — CBC
HCT: 34.5 % — ABNORMAL LOW (ref 36.0–46.0)
Hemoglobin: 10.9 g/dL — ABNORMAL LOW (ref 12.0–15.0)
MCH: 29.2 pg (ref 26.0–34.0)
MCHC: 31.6 g/dL (ref 30.0–36.0)
MCV: 92.5 fL (ref 80.0–100.0)
Platelets: 401 10*3/uL — ABNORMAL HIGH (ref 150–400)
RBC: 3.73 MIL/uL — ABNORMAL LOW (ref 3.87–5.11)
RDW: 13.1 % (ref 11.5–15.5)
WBC: 11.3 10*3/uL — ABNORMAL HIGH (ref 4.0–10.5)
nRBC: 0 % (ref 0.0–0.2)

## 2019-06-21 LAB — RAPID URINE DRUG SCREEN, HOSP PERFORMED
Amphetamines: NOT DETECTED
Barbiturates: NOT DETECTED
Benzodiazepines: NOT DETECTED
Cocaine: NOT DETECTED
Opiates: NOT DETECTED
Tetrahydrocannabinol: POSITIVE — AB

## 2019-06-21 LAB — URINALYSIS, ROUTINE W REFLEX MICROSCOPIC
Bacteria, UA: NONE SEEN
Bilirubin Urine: NEGATIVE
Glucose, UA: NEGATIVE mg/dL
Hgb urine dipstick: NEGATIVE
Ketones, ur: 80 mg/dL — AB
Leukocytes,Ua: NEGATIVE
Nitrite: NEGATIVE
Protein, ur: 100 mg/dL — AB
Specific Gravity, Urine: 1.029 (ref 1.005–1.030)
pH: 6 (ref 5.0–8.0)

## 2019-06-21 LAB — LIPASE, BLOOD: Lipase: 22 U/L (ref 11–51)

## 2019-06-21 LAB — I-STAT BETA HCG BLOOD, ED (MC, WL, AP ONLY): I-stat hCG, quantitative: <5 m[IU]/mL (ref <5)

## 2019-06-21 LAB — COMPREHENSIVE METABOLIC PANEL
ALT: 24 U/L (ref 0–44)
AST: 35 U/L (ref 15–41)
Albumin: 4.7 g/dL (ref 3.5–5.0)
Alkaline Phosphatase: 47 U/L (ref 38–126)
Anion gap: 14 (ref 5–15)
BUN: 7 mg/dL (ref 6–20)
CO2: 20 mmol/L — ABNORMAL LOW (ref 22–32)
Calcium: 9.8 mg/dL (ref 8.9–10.3)
Chloride: 108 mmol/L (ref 98–111)
Creatinine, Ser: 0.89 mg/dL (ref 0.44–1.00)
GFR calc Af Amer: >60 mL/min (ref >60)
GFR calc non Af Amer: >60 mL/min (ref >60)
Glucose, Bld: 140 mg/dL — ABNORMAL HIGH (ref 70–99)
Potassium: 3.5 mmol/L (ref 3.5–5.1)
Sodium: 142 mmol/L (ref 135–145)
Total Bilirubin: 1 mg/dL (ref 0.3–1.2)
Total Protein: 8.3 g/dL — ABNORMAL HIGH (ref 6.5–8.1)

## 2019-06-21 MED ORDER — OXYCODONE-ACETAMINOPHEN 5-325 MG PO TABS
1.0000 | ORAL_TABLET | ORAL | Status: DC | PRN
  Administered 2019-06-21: 1 via ORAL
  Filled 2019-06-21: qty 1

## 2019-06-21 MED ORDER — CAPSAICIN 0.025 % EX CREA
TOPICAL_CREAM | CUTANEOUS | Status: AC
  Filled 2019-06-21: qty 60

## 2019-06-21 MED ORDER — SODIUM CHLORIDE 0.9 % IV SOLN: Freq: Once | INTRAVENOUS | Status: AC

## 2019-06-21 MED ORDER — SODIUM CHLORIDE 0.9% FLUSH: 3.0000 mL | Freq: Once | INTRAVENOUS | Status: DC

## 2019-06-21 MED ORDER — ONDANSETRON 4 MG PO TBDP
4.0000 mg | ORAL_TABLET | Freq: Once | ORAL | Status: AC | PRN
  Administered 2019-06-21: 17:00:00 4 mg via ORAL
  Filled 2019-06-21: qty 1

## 2019-06-21 MED ORDER — KETOROLAC TROMETHAMINE 30 MG/ML IJ SOLN
30.0000 mg | Freq: Once | INTRAMUSCULAR | Status: AC
  Administered 2019-06-21: 21:00:00 30 mg via INTRAVENOUS
  Filled 2019-06-21: qty 1

## 2019-06-21 MED ORDER — ONDANSETRON HCL 4 MG/2ML IJ SOLN
4.0000 mg | Freq: Once | INTRAMUSCULAR | Status: AC
  Administered 2019-06-21: 4 mg via INTRAVENOUS
  Filled 2019-06-21: qty 2

## 2019-06-21 MED ORDER — DROPERIDOL 2.5 MG/ML IJ SOLN
1.2500 mg | Freq: Once | INTRAMUSCULAR | Status: AC
  Administered 2019-06-21: 1.25 mg via INTRAVENOUS
  Filled 2019-06-21: qty 2

## 2019-06-21 MED ORDER — SODIUM CHLORIDE 0.9% FLUSH
3.0000 mL | Freq: Once | INTRAVENOUS | Status: AC
  Administered 2019-06-21: 3 mL via INTRAVENOUS

## 2019-06-21 MED ORDER — PROMETHAZINE HCL 25 MG/ML IJ SOLN
25.0000 mg | Freq: Once | INTRAMUSCULAR | Status: AC
  Administered 2019-06-21: 25 mg via INTRAVENOUS
  Filled 2019-06-21: qty 1

## 2019-06-21 NOTE — ED Notes (Signed)
Pt is still screaming in room. When asked pt states that she still feels pain and nausea. Provider notified.

## 2019-06-21 NOTE — ED Notes (Addendum)
Patient repeatedly takes off blood pressure cuff and pulse ox and refusing to keep them on. Patient pacing around the room. Patient asked to please sit down so patient will not pull IV out.

## 2019-06-21 NOTE — ED Provider Notes (Signed)
Care assumed from [facility], PA-C at shift change. See his note for full HPI.  In short, patient presents to the ED due to sudden onset of nausea, vomiting, diarrhea, and abdominal pain that started earlier today. Patient notes she has had a few episodes of non-bloody, non-bilious emesis today. She admits to smoking a heavy amount of marijuana last night. She has a history of cannabis hyperemesis syndrome and pelvic pain from uterine fibroids through chart review. Patient notes this feels like her similar episodes of cannabis hyperemesis, but more severe. She admits to increased discharge starting today. Her LMP was 1 week ago. She is only sexually active with females. She denies sexual intercourse for the past 5 months.  Plan from shift change PA: reassess patient after cannabis hyperemesis syndrome medication and po challenge Physical Exam  BP (!) 152/127 (BP Location: Right Arm)   Pulse 72   Temp (!) 97.4 F (36.3 C) (Oral)   Resp 18   Ht 5\' 1"  (1.549 m)   Wt 66.7 kg   SpO2 99%   BMI 27.78 kg/m   Physical Exam Vitals and nursing note reviewed. Exam conducted with a chaperone present.  Constitutional:      General: She is not in acute distress.    Appearance: She is ill-appearing.     Comments: Appears very uncomfortable in bed.  HENT:     Head: Normocephalic.  Eyes:     Pupils: Pupils are equal, round, and reactive to light.  Cardiovascular:     Rate and Rhythm: Normal rate and regular rhythm.     Pulses: Normal pulses.     Heart sounds: Normal heart sounds. No murmur. No friction rub. No gallop.   Pulmonary:     Effort: Pulmonary effort is normal.     Breath sounds: Normal breath sounds.  Abdominal:     General: Abdomen is flat. Bowel sounds are normal. There is no distension.     Palpations: Abdomen is soft.     Tenderness: There is abdominal tenderness. There is guarding. There is no rebound.     Comments: Tenderness to palpation in left upper and lower quadrants and  suprapubic region with voluntary guarding. No rebound. Negative McBurney's point tenderness. Negative Murphy's sign.  Genitourinary:    Exam position: Supine.     Vagina: Vaginal discharge and tenderness present. No bleeding.     Cervix: Cervical motion tenderness and discharge present. No cervical bleeding.     Adnexa:        Right: No mass.         Left: No mass.       Comments: White discharge in vaginal vault and coming from cervix. Severe tenderness on bimanual exam with CMT. No adnexal mass noted.  Musculoskeletal:     Cervical back: Neck supple.     Comments: Able to move all 4 extremities without difficulty.   Skin:    General: Skin is warm.  Neurological:     General: No focal deficit present.     Mental Status: She is alert.     ED Course/Procedures   Clinical Course as of Jun 21 29  Sat Jun 22, 2019  0026 Plan: Pt with pelvic pain.  [place] pending to r/o torsion.  Pelvic by Lanna Poche, PA-C with significant CMT.  Will start treatment for PID.  If [place] neg, will CT.     [HM]    Clinical Course User Index [HM] Muthersbaugh, Gwenlyn Perking    Procedures  MDM  See previous PA note for entire work-up and MDM. Patient was given Capsaicin, Toradol, Zofran, Phenergan and IVFs prior to shift change. I was notified by RN that patient was still in significant amount of pain. EKG ordered to check for prolonged QTc given prior history. Dr. Ralene Bathe manually reviewed EKG and recommended giving Droperidol.    CBC with mild leukocytosis at 11.3 and hemoglobin at 10.9. Patient has chronically low hemoglobin ranging from 10.3-11.7. CMP reassuring with mild hyperglycemia at 140 which appears to typically be elevated. Pregnancy test negative. Lipase normal, doubt pancreatitis. UA significant for ketonuria and proteinuria likely due to severe dehydration and starvation from emesis. No signs of infection. UDS positive for THC.   Given patient's significant suprapubic tenderness, pelvic exam  performed. Patient has significant tenderness with possible CMT tenderness. Mild amount of white discharge present. No adnexal mass appreciated on exam.  Wet prep negative for clue cells, trichomonas, and yeast.   Will obtain pelvic US to rule out ovarian cyst and torsion. Patient handed off to [facility], PA-C at shift change who will follow-up on the [place] results, reassess patient, and determine disposition. If [place] is normal, reassess patient for improvement in abdominal tenderness. If still experiencing severe abdominal tenderness order CT abdomen/pelvis to rule out abdominal etiology.      Karie Kirks 06/22/19 [id number]    Quintella Reichert, MD 06/25/19 [phone]

## 2019-06-21 NOTE — ED Provider Notes (Signed)
[facility] DEPT Provider Note   CSN: [id number] Arrival date & time: 06/21/19  1914     History Chief Complaint  Patient presents with  . Abdominal Pain    Carrie Guerra is a 33 y.o. female.  HPI Patient is a 33 year old female with history of fibroids asthma and cannabis hyperemesis syndrome presents today with acute onset of nausea vomiting diarrhea generalized abdominal pain and chills began this morning.  Patient states she smoked marijuana heavily last night.  Patient states she is taken aspirin Pepto-Bismol and amoxicillin x1 with no improvement at home.  Patient states pain is generalized, dull, severe, 10/10 pain not radiating with no aggravating or mitigating factors.  States that it is slightly better when she is laying on her stomach.  States she has had multiple episodes of emesis today is unsure how many.  Patient states she has had 3 episodes of the same symptoms in the past.  Patient denies any vaginal bleeding or discharge, denies any concern for sexually transmitted disease.  Patient denies any history of abdominal surgeries.  States her last menstrual period was 1 week ago.  Denies any hematuria, dysuria, frequency or urgency.  States she has not eaten anything today.  Denies any fluid intake as well.      Past Medical History:  Diagnosis Date  . Asthma   . Cannabis hyperemesis syndrome concurrent with and due to cannabis abuse ([place]) 11/09/2016  . Fibroids     Patient Active Problem List   Diagnosis Date Noted  . Vomiting 11/17/2016  . Hypokalemia 11/17/2016  . Cannabis hyperemesis syndrome concurrent with and due to cannabis abuse ([place]) 11/09/2016    Past Surgical History:  Procedure Laterality Date  . NO PAST SURGERIES       OB History    Gravida  0   Para  0   Term  0   Preterm  0   AB  0   Living  0     SAB  0   TAB  0   Ectopic  0   Multiple  0   Live Births  0           Family History   Problem Relation Age of Onset  . Diabetes Mother   . Diabetes Other     Social History   Tobacco Use  . Smoking status: Former Smoker    Types: Cigarettes  . Smokeless tobacco: Never Used  . Tobacco comment: 11/17/2016 "someday smoker when I did smoke"  Substance Use Topics  . Alcohol use: Yes    Comment: 11/17/2016 "might drink 2-3 days/month"  . Drug use: Yes    Types: Marijuana    Home Medications Prior to Admission medications   Medication Sig Start Date End Date Taking? Authorizing Provider  acetaminophen (TYLENOL) 325 MG tablet Take 2 tablets (650 mg total) by mouth every 6 (six) hours as needed for mild pain (or Fever >/= 101). Patient not taking: Reported on 07/19/2017 11/19/16   Lavina Hamman, MD  alum & mag hydroxide-simeth (MAALOX/MYLANTA) 200-200-20 MG/5ML suspension Take 15 mLs by mouth every 6 (six) hours as needed for indigestion or heartburn. Patient not taking: Reported on 07/19/2017 11/19/16   Lavina Hamman, MD  aspirin-acetaminophen-caffeine [facility] MIGRAINE) [phone] MG tablet Take 2 tablets by mouth once as needed for headache or migraine.    [provider]  ibuprofen (ADVIL,MOTRIN) 200 MG tablet Take 400 mg by mouth daily as needed for moderate pain.  [provider]  lidocaine (XYLOCAINE) 2 % solution Use as directed 15 mLs in the mouth or throat as needed for mouth pain. 05/28/18   Maczis, Barth Kirks, PA-C  methocarbamol (ROBAXIN) 500 MG tablet Take 1 tablet (500 mg total) by mouth 3 (three) times daily. Patient not taking: Reported on 04/04/2017 11/19/16   Lavina Hamman, MD  metroNIDAZOLE (FLAGYL) 500 MG tablet Take 1 tablet (500 mg total) by mouth 2 (two) times daily. 04/18/19   Albrizze, Kaitlyn E, PA-C  naproxen (NAPROSYN) 375 MG tablet Take 1 tablet (375 mg total) by mouth 2 (two) times daily. 07/19/17   Ocie Cornfield T, PA-C  ondansetron (ZOFRAN ODT) 8 MG disintegrating tablet Take 1 tablet (8 mg total) by mouth every 8 (eight)  hours as needed for nausea or vomiting. Patient not taking: Reported on 04/04/2017 11/09/16   Laury Deep, CNM  ondansetron (ZOFRAN) 4 MG tablet Take 1 tablet (4 mg total) by mouth every 8 (eight) hours as needed for nausea or vomiting. 12/23/18   Couture, Cortni S, PA-C  pantoprazole (PROTONIX) 40 MG tablet Take 1 tablet (40 mg total) by mouth 2 (two) times daily before a meal. 11/19/16 12/02/16  Lavina Hamman, MD  prochlorperazine (COMPAZINE) 25 MG suppository Place 1 suppository (25 mg total) rectally every 12 (twelve) hours as needed for nausea or vomiting. Patient not taking: Reported on 04/04/2017 11/08/16   Lorin Glass, PA-C  promethazine (PHENERGAN) 25 MG tablet Take 1 tablet (25 mg total) by mouth every 6 (six) hours as needed for nausea or vomiting. 10/31/18   Drenda Freeze, MD  sucralfate (CARAFATE) 1 GM/10ML suspension Take 10 mLs (1 g total) by mouth 4 (four) times daily -  with meals and at bedtime. Patient not taking: Reported on 04/04/2017 11/19/16   Lavina Hamman, MD  sulfacetamide (BLEPH-10) 10 % ophthalmic solution Place 1-2 drops into the left eye every 4 (four) hours. Patient not taking: Reported on 07/19/2017 04/04/17   Delia Heady, PA-C    Allergies    Shrimp [shellfish allergy] and Cabbage  Review of Systems   Review of Systems  Constitutional: Positive for chills. Negative for fever.  HENT: Negative for congestion.   Eyes: Negative for pain.  Respiratory: Negative for cough and shortness of breath.   Cardiovascular: Negative for chest pain and leg swelling.  Gastrointestinal: Positive for abdominal pain, diarrhea, nausea and vomiting.  Genitourinary: Negative for dysuria.  Musculoskeletal: Negative for myalgias.  Skin: Negative for rash.  Neurological: Negative for dizziness and headaches.    Physical Exam Updated Vital Signs BP 131/71   Pulse 92   Temp (!) 97.4 F (36.3 C) (Oral)   Resp 18   Ht 5\' 1"  (1.549 m)   Wt 66.7 kg   SpO2 99%   BMI  27.78 kg/m   Physical Exam Vitals and nursing note reviewed.  Constitutional:      General: She is in acute distress.     Appearance: She is not ill-appearing.     Comments: Patient is writhing in bed with pain initially refuses answer questions.  Is able to be redirected when told that she can only be treated after history and physical exam.  After this cooperative and pleasant able to follow commands and answer questions appropriately.  HENT:     Head: Normocephalic and atraumatic.     Nose: Nose normal.  Eyes:     General: No scleral icterus. Cardiovascular:     Rate and Rhythm:  Normal rate and regular rhythm.     Pulses: Normal pulses.     Heart sounds: Normal heart sounds.  Pulmonary:     Effort: Pulmonary effort is normal. No respiratory distress.     Breath sounds: No wheezing.  Abdominal:     Palpations: Abdomen is soft.     Tenderness: There is abdominal tenderness (Diffuse generalized abdominal pain). There is no right CVA tenderness or left CVA tenderness.     Comments: Diffuse generalized abdominal pain with no point tenderness negative psoas, obturator, no particular pain with McBurney's point.  Bowel sounds normal easily auscultated.  No abdominal distention.  No CVA tenderness  Genitourinary:    Comments: Deferred Musculoskeletal:     Cervical back: Normal range of motion.     Right lower leg: No edema.     Left lower leg: No edema.     Comments: Moving all 4 limbs.  Able to walk and ambulate in ED without difficulty.  Skin:    General: Skin is warm and dry.     Capillary Refill: Capillary refill takes less than 2 seconds.  Neurological:     Mental Status: She is alert. Mental status is at baseline.  Psychiatric:        Mood and Affect: Mood normal.        Behavior: Behavior normal.     ED Results / Procedures / Treatments   Labs (all labs ordered are listed, but only abnormal results are displayed) Labs Reviewed  URINALYSIS, ROUTINE W REFLEX MICROSCOPIC  - Abnormal; Notable for the following components:      Result Value   Ketones, ur 80 (*)    Protein, ur 100 (*)    All other components within normal limits  RAPID URINE DRUG SCREEN, HOSP PERFORMED - Abnormal; Notable for the following components:   Tetrahydrocannabinol POSITIVE (*)    All other components within normal limits   Results for orders placed or performed during the hospital encounter of 06/21/19  Urinalysis, Routine w reflex microscopic  Result Value Ref Range   Color, Urine YELLOW YELLOW   APPearance CLEAR CLEAR   Specific Gravity, Urine 1.029 1.005 - 1.030   pH 6.0 5.0 - 8.0   Glucose, UA NEGATIVE NEGATIVE mg/dL   Hgb urine dipstick NEGATIVE NEGATIVE   Bilirubin Urine NEGATIVE NEGATIVE   Ketones, ur 80 (A) NEGATIVE mg/dL   Protein, ur 100 (A) NEGATIVE mg/dL   Nitrite NEGATIVE NEGATIVE   Leukocytes,Ua NEGATIVE NEGATIVE   RBC / HPF 0-5 0 - 5 RBC/hpf   WBC, UA 0-5 0 - 5 WBC/hpf   Bacteria, UA NONE SEEN NONE SEEN   Squamous Epithelial / LPF 0-5 0 - 5   Mucus PRESENT    Hyaline Casts, UA PRESENT   Rapid urine drug screen (hospital performed)  Result Value Ref Range   Opiates NONE DETECTED NONE DETECTED   Cocaine NONE DETECTED NONE DETECTED   Benzodiazepines NONE DETECTED NONE DETECTED   Amphetamines NONE DETECTED NONE DETECTED   Tetrahydrocannabinol POSITIVE (A) NONE DETECTED   Barbiturates NONE DETECTED NONE DETECTED   No results found.   EKG None  Radiology No results found.  Procedures Procedures (including critical care time)  Medications Ordered in ED Medications  0.9 %  sodium chloride infusion ( Intravenous Bolus from Bag 06/21/19 2118)  promethazine (PHENERGAN) injection 25 mg (25 mg Intravenous Given 06/21/19 2117)  ondansetron (ZOFRAN) injection 4 mg (4 mg Intravenous Given 06/21/19 2118)  ketorolac (TORADOL) 30 MG/ML injection  30 mg (30 mg Intravenous Given 06/21/19 2118)  sodium chloride flush (NS) 0.9 % injection 3 mL (3 mLs Intravenous  Given 06/21/19 2119)  capsaicin (ZOSTRIX) 0.025 % cream ( Topical Given 06/21/19 2118)    ED Course  I have reviewed the triage vital signs and the nursing notes.  Pertinent labs & imaging results that were available during my care of the patient were reviewed by me and considered in my medical decision making (see chart for details).    MDM Rules/Calculators/A&P                      Patient presents today 33 year old female with history of cannabis induced hyperemesis syndrome.  States that she smoked marijuana heavily last night and had abrupt onset of nausea, vomiting, diarrhea, generalized abdominal pain that started this morning.  States is been consistent since.  Has not been worsened.  Denies any aggravating or mitigating factors is taken some over-the-counter medications with no improvement.  Physical exam remarkable for normal heart rate, no fever, SPO2 of 99%.  Patient has no point tenderness has no peritoneal signs.  Patient has been to be in acute distress.  Is vomiting.  Reviewed EMR patient has been seen in the past and treated with Phenergan, Zofran, Toradol, fluids and improved for discharge in the past.  Patient had blood work done at [facility] before leaving without being seen I will not reorder lab work today.  Will obtain urinalysis and urine drug screen.  Patient is nonpregnant no need for urine pregnancy screen.  See lab work discussion below.  Labs were drawn at [facility].  Lipase within normal limits, CMP without acute abnormality other than mildly elevated glucose at 140.  CBC with mild leukocytosis 11.3 patiently mildly anemic 10.9 however this appears to be patient's baseline.  I-STAT hCG negative.  Patient received Zofran and Percocet at [facility] on the left without being seen.  Review of EMR patient tested positive for [facility] 12/23/2018 has also positive for THC however prior injection around before.  The causes of generalized abdominal pain  include but are not limited to AAA, mesenteric ischemia, appendicitis, diverticulitis, DKA, gastritis, gastroenteritis, AMI, nephrolithiasis, pancreatitis, peritonitis, adrenal insufficiency,lead poisoning, iron toxicity, intestinal ischemia, constipation, UTI,SBO/LBO, splenic rupture, biliary disease, IBD, IBS, PUD, or hepatitis. Patient's pain appears to be more abdominal in origin.  Doubt ectopic pregnancy, ovarian torsion, PID.  Patient is afebrile and has no history of diverticulosis.  Doubt diverticulitis today.  Patient's last CT scan was 7 months ago with no acute abnormality other than fibroids in uterus.  Doubt appendicitis as patient has no fever and no right lower quadrant tenderness.  Patient does seem to have generalized abdominal pain that is severe.  We will treat symptomatically and reassess patient.  Urinalysis obtained here shows ketones and protein likely due to starvation and dehydration.  No signs of infection.  THC positive on UDS.    Patient care transferred to: Charmaine Downs PA-C plan to reassess patient after medications and appropriately dispo.  Likely will be able to discharge without additional work-up as patient had blood work and urinalysis without any significant abnormalities.  Patient has had THC associated hyperemesis in the past.  Smoked heavily yesterday most likely this is recurrence of this. Counseled on smoking cessation.     Final Clinical Impression(s) / ED Diagnoses Final diagnoses:  Cyclical vomiting syndrome not associated with migraine    Rx / DC Orders ED Discharge  Orders    None       Tedd Sias, [place] 06/21/19 2216    Drenda Freeze, MD 06/25/19 [phone]

## 2019-06-21 NOTE — ED Triage Notes (Signed)
C/o LLQ abdominal pain and n/v.  Hx of the same with fibroids.  Took aspirin, pepto and amoxicillin at home with no relief of symptoms.

## 2019-06-21 NOTE — ED Triage Notes (Signed)
Pt arriving with abdominal pain that began this morning. Pt reports  Reports nausea and vomiting. Pt was seen earlier at [facility]. Labs done and medications given while at [facility]. Pt has hx of fibroids. Reports no abnormal bleeding.

## 2019-06-21 NOTE — ED Triage Notes (Signed)
Pt c/o abdominal pain starting this morning. Pt seen at [facility] ED earlier today. Hx of fibroids. Pt took aspirin, pepto, and amoxicillin at home with no relief.

## 2019-06-22 ENCOUNTER — Emergency Department (HOSPITAL_COMMUNITY): Payer: 59

## 2019-06-22 LAB — WET PREP, GENITAL
Clue Cells Wet Prep HPF POC: NONE SEEN
Sperm: NONE SEEN
Trich, Wet Prep: NONE SEEN
WBC, Wet Prep HPF POC: NONE SEEN
Yeast Wet Prep HPF POC: NONE SEEN

## 2019-06-22 MED ORDER — AZITHROMYCIN 250 MG PO TABS
1000.0000 mg | ORAL_TABLET | Freq: Once | ORAL | Status: AC
  Administered 2019-06-22: 1000 mg via ORAL
  Filled 2019-06-22: qty 4

## 2019-06-22 MED ORDER — HYDROMORPHONE HCL 1 MG/ML IJ SOLN
1.0000 mg | Freq: Once | INTRAMUSCULAR | Status: DC
  Filled 2019-06-22: qty 1

## 2019-06-22 MED ORDER — CEFTRIAXONE SODIUM 250 MG IJ SOLR
250.0000 mg | Freq: Once | INTRAMUSCULAR | Status: AC
  Administered 2019-06-22: 250 mg via INTRAMUSCULAR
  Filled 2019-06-22: qty 250

## 2019-06-22 MED ORDER — STERILE WATER FOR INJECTION IJ SOLN
INTRAMUSCULAR | Status: AC
  Filled 2019-06-22: qty 10

## 2019-06-22 MED ORDER — SUCRALFATE 1 G PO TABS: 1.0000 g | ORAL_TABLET | Freq: Three times a day (TID) | ORAL | 0 refills | Status: DC

## 2019-06-22 MED ORDER — PROMETHAZINE HCL 25 MG RE SUPP: 25.0000 mg | Freq: Four times a day (QID) | RECTAL | 0 refills | Status: DC | PRN

## 2019-06-22 MED ORDER — PANTOPRAZOLE SODIUM 20 MG PO TBEC: 20.0000 mg | DELAYED_RELEASE_TABLET | Freq: Every day | ORAL | 0 refills | Status: DC

## 2019-06-22 MED ORDER — DOXYCYCLINE HYCLATE 100 MG PO CAPS: 100.0000 mg | ORAL_CAPSULE | Freq: Two times a day (BID) | ORAL | 0 refills | Status: DC

## 2019-06-22 NOTE — ED Notes (Signed)
Patient transported to Ultrasound 

## 2019-06-22 NOTE — ED Provider Notes (Signed)
Care assumed from [facility], [place].  Please see her full H&P.  In short,  Carrie Guerra is a 33 y.o. female presents for abd pain, nausea, vomiting and diarrhea. Pt does have a hx of heavy marijuana usage and cannabinoid hyperemesis.  Also with hx of uterine fibroids. Today with left lower and left upper abd pain.  Pelvic exam by Lanna Poche, PA-C with significant CMT.  Last CT 7 mos ago.    Physical Exam  BP (!) 152/127 (BP Location: Right Arm)   Pulse 72   Temp (!) 97.4 F (36.3 C) (Oral)   Resp 18   Ht 5\' 1"  (1.549 m)   Wt 66.7 kg   SpO2 99%   BMI 27.78 kg/m   Physical Exam Vitals and nursing note reviewed.  Constitutional:      General: She is not in acute distress.    Appearance: She is well-developed.  HENT:     Head: Normocephalic.  Eyes:     General: No scleral icterus.    Conjunctiva/sclera: Conjunctivae normal.  Cardiovascular:     Rate and Rhythm: Normal rate.  Pulmonary:     Effort: Pulmonary effort is normal.  Abdominal:     General: There is no distension.     Palpations: Abdomen is soft.     Tenderness: There is abdominal tenderness. There is no guarding or rebound.  Musculoskeletal:        General: Normal range of motion.     Cervical back: Normal range of motion.  Skin:    General: Skin is warm and dry.  Neurological:     Mental Status: She is alert.     ED Course/Procedures   Clinical Course as of Jun 21 253  Sat Jun 22, 2019  0026 Plan: Pt with pelvic pain.  [place] pending to r/o torsion.  Pelvic by Lanna Poche, PA-C with significant CMT.  Will start treatment for PID.  If [place] neg, will CT.     [HM]  0249 Wet prep without yeast, trichomoniasis, clue cells or white blood cells  WBC, Wet Prep HPF POC: NONE SEEN [HM]  0249 UDS is positive for marijuana  Tetrahydrocannabinol(!): POSITIVE [HM]  0249 Ketones and proteinuria noted.  Patient has been given fluids.  Ketones, ur(!): 80 [HM]    Clinical Course User Index [HM] Carrie Guerra,  Carrie Soho, PA-C    Procedures  MDM   Patient presents with vomiting and abdominal pain.  She has been seen several times for this in the emergency department.  Initial provider had some concern about level of pain.  Labs drawn earlier in the evening were reassuring.  Urinalysis did have some ketones and proteinuria however fluids were given.  Patient will need primary care follow-up for proteinuria.  Pelvic exam with reported cervical motion tenderness.  Provider performing exam was concerned about possible PID.  Medications given here in the emergency department.  Wet prep not convincing for PID as there are no white blood cells.  Will give doxycycline to cover potential given concern by previous provider.  She will need close follow-up with OB/GYN.  2:53 AM Patient is well-appearing.  No vomiting in the department while under my care.  On repeat exam she does have some generalized tenderness but is without guarding or rebound.  Highly doubt diverticulitis, appendicitis, bowel perforation, small bowel obstruction.  Vital signs are reassuring and she is without tachycardia or fever.  Her pain appears well controlled.  Will discharge home with symptomatic therapy.  She is  to return to the emergency department for new or worsening symptoms.  Cyclical vomiting syndrome not associated with migraine  Generalized abdominal pain  Proteinuria, unspecified type       Carrie Guerra [id number]    Carrie Fuel, [place] [id number] [phone]

## 2019-06-22 NOTE — Discharge Instructions (Addendum)
1. Medications: zofran, vicodin, usual home medications 2. Treatment: rest, drink plenty of fluids, advance diet slowly 3. Follow Up: Please followup with your primary doctor or OB/GYN in 2 days for discussion of your diagnoses and further evaluation after today's visit; if you do not have a primary care doctor use the resource guide provided to find one; Please return to the ER for persistent vomiting, high fevers or worsening symptoms

## 2019-06-24 ENCOUNTER — Encounter (HOSPITAL_COMMUNITY): Payer: Self-pay | Admitting: Emergency Medicine

## 2019-06-24 ENCOUNTER — Other Ambulatory Visit: Payer: Self-pay

## 2019-06-24 ENCOUNTER — Emergency Department (HOSPITAL_COMMUNITY)
Admission: EM | Admit: 2019-06-24 | Discharge: 2019-06-24 | Disposition: A | Discharge status: Home / Self Care | Payer: 59 | Attending: Emergency Medicine | Admitting: Emergency Medicine

## 2019-06-24 DIAGNOSIS — R109 Unspecified abdominal pain: Secondary | ICD-10-CM | POA: Diagnosis present

## 2019-06-24 DIAGNOSIS — J45909 Unspecified asthma, uncomplicated: Secondary | ICD-10-CM | POA: Insufficient documentation

## 2019-06-24 DIAGNOSIS — F12188 Cannabis abuse with other cannabis-induced disorder: Secondary | ICD-10-CM | POA: Diagnosis not present

## 2019-06-24 DIAGNOSIS — Z87891 Personal history of nicotine dependence: Secondary | ICD-10-CM | POA: Diagnosis not present

## 2019-06-24 DIAGNOSIS — Z79899 Other long term (current) drug therapy: Secondary | ICD-10-CM | POA: Insufficient documentation

## 2019-06-24 DIAGNOSIS — E876 Hypokalemia: Secondary | ICD-10-CM | POA: Diagnosis not present

## 2019-06-24 LAB — COMPREHENSIVE METABOLIC PANEL
ALT: 31 U/L (ref 0–44)
AST: 37 U/L (ref 15–41)
Albumin: 4.8 g/dL (ref 3.5–5.0)
Alkaline Phosphatase: 44 U/L (ref 38–126)
Anion gap: 12 (ref 5–15)
BUN: 15 mg/dL (ref 6–20)
CO2: 25 mmol/L (ref 22–32)
Calcium: 9.5 mg/dL (ref 8.9–10.3)
Chloride: 104 mmol/L (ref 98–111)
Creatinine, Ser: 1 mg/dL (ref 0.44–1.00)
GFR calc Af Amer: >60 mL/min (ref >60)
GFR calc non Af Amer: >60 mL/min (ref >60)
Glucose, Bld: 99 mg/dL (ref 70–99)
Potassium: 3 mmol/L — ABNORMAL LOW (ref 3.5–5.1)
Sodium: 141 mmol/L (ref 135–145)
Total Bilirubin: 1.7 mg/dL — ABNORMAL HIGH (ref 0.3–1.2)
Total Protein: 8.5 g/dL — ABNORMAL HIGH (ref 6.5–8.1)

## 2019-06-24 LAB — CBC
HCT: 39.3 % (ref 36.0–46.0)
Hemoglobin: 12.3 g/dL (ref 12.0–15.0)
MCH: 28.1 pg (ref 26.0–34.0)
MCHC: 31.3 g/dL (ref 30.0–36.0)
MCV: 89.9 fL (ref 80.0–100.0)
Platelets: 450 10*3/uL — ABNORMAL HIGH (ref 150–400)
RBC: 4.37 MIL/uL (ref 3.87–5.11)
RDW: 12.6 % (ref 11.5–15.5)
WBC: 6.9 10*3/uL (ref 4.0–10.5)
nRBC: 0 % (ref 0.0–0.2)

## 2019-06-24 LAB — LIPASE, BLOOD: Lipase: 19 U/L (ref 11–51)

## 2019-06-24 MED ORDER — LORAZEPAM 2 MG/ML IJ SOLN
1.0000 mg | Freq: Once | INTRAMUSCULAR | Status: AC
  Administered 2019-06-24: 1 mg via INTRAVENOUS
  Filled 2019-06-24: qty 1

## 2019-06-24 MED ORDER — METOCLOPRAMIDE HCL 5 MG/ML IJ SOLN: 10.0000 mg | Freq: Once | INTRAMUSCULAR | Status: DC

## 2019-06-24 MED ORDER — FAMOTIDINE IN NACL 20-0.9 MG/50ML-% IV SOLN
20.0000 mg | Freq: Once | INTRAVENOUS | Status: AC
  Administered 2019-06-24: 20 mg via INTRAVENOUS
  Filled 2019-06-24: qty 50

## 2019-06-24 MED ORDER — POTASSIUM CHLORIDE CRYS ER 20 MEQ PO TBCR
40.0000 meq | EXTENDED_RELEASE_TABLET | Freq: Once | ORAL | Status: AC
  Administered 2019-06-24: 40 meq via ORAL
  Filled 2019-06-24: qty 2

## 2019-06-24 MED ORDER — DROPERIDOL 2.5 MG/ML IJ SOLN
2.5000 mg | Freq: Once | INTRAMUSCULAR | Status: AC
  Administered 2019-06-24: 2.5 mg via INTRAVENOUS
  Filled 2019-06-24: qty 2

## 2019-06-24 MED ORDER — SODIUM CHLORIDE 0.9 % IV BOLUS
1000.0000 mL | Freq: Once | INTRAVENOUS | Status: AC
  Administered 2019-06-24: 1000 mL via INTRAVENOUS

## 2019-06-24 NOTE — ED Provider Notes (Signed)
[facility] DEPT Provider Note   CSN: [id number] Arrival date & time: 06/24/19  1057     History Chief Complaint  Patient presents with  . Abdominal Pain    Natahlia Grossen is a 33 y.o. female.  Patient c/o nausea, vomiting and mid to upper abdominal pain in the past day. Symptoms acute onset, mod-severe, constant, persistent, without specific exacerbating or alleviating factors. Patient with hx cannabinoid hyperemesis syndrome and seen in ED 3 days prior with same symptoms and UDS +THC then. Emesis clear or color recently ingested fluids, no bloody or bilious emesis. Last bm 2 days ago. No dysuria. No vaginal discharge or bleeding. lnmp 2 weeks ago.    Abdominal Pain Associated symptoms: nausea and vomiting   Associated symptoms: no chest pain, no chills, no dysuria, no fever, no shortness of breath, no sore throat, no vaginal bleeding and no vaginal discharge        Past Medical History:  Diagnosis Date  . Asthma   . Cannabis hyperemesis syndrome concurrent with and due to cannabis abuse ([place]) 11/09/2016  . Fibroids     Patient Active Problem List   Diagnosis Date Noted  . Vomiting 11/17/2016  . Hypokalemia 11/17/2016  . Cannabis hyperemesis syndrome concurrent with and due to cannabis abuse ([place]) 11/09/2016    Past Surgical History:  Procedure Laterality Date  . NO PAST SURGERIES       OB History    Gravida  0   Para  0   Term  0   Preterm  0   AB  0   Living  0     SAB  0   TAB  0   Ectopic  0   Multiple  0   Live Births  0           Family History  Problem Relation Age of Onset  . Diabetes Mother   . Diabetes Other     Social History   Tobacco Use  . Smoking status: Former Smoker    Types: Cigarettes  . Smokeless tobacco: Never Used  . Tobacco comment: 11/17/2016 "someday smoker when I did smoke"  Substance Use Topics  . Alcohol use: Yes    Comment: 11/17/2016 "might drink 2-3 days/month"  . Drug  use: Yes    Types: Marijuana    Home Medications Prior to Admission medications   Medication Sig Start Date End Date Taking? Authorizing Provider  acetaminophen (TYLENOL) 325 MG tablet Take 2 tablets (650 mg total) by mouth every 6 (six) hours as needed for mild pain (or Fever >/= 101). Patient not taking: Reported on 07/19/2017 11/19/16   Lavina Hamman, MD  alum & mag hydroxide-simeth (MAALOX/MYLANTA) 200-200-20 MG/5ML suspension Take 15 mLs by mouth every 6 (six) hours as needed for indigestion or heartburn. Patient not taking: Reported on 07/19/2017 11/19/16   Lavina Hamman, MD  aspirin-acetaminophen-caffeine [facility] MIGRAINE) [phone] MG tablet Take 2 tablets by mouth once as needed for headache or migraine.    [provider]  doxycycline (VIBRAMYCIN) 100 MG capsule Take 1 capsule (100 mg total) by mouth 2 (two) times daily. 06/22/19   Muthersbaugh, [name], PA-C  ibuprofen (ADVIL,MOTRIN) 200 MG tablet Take 400 mg by mouth daily as needed for moderate pain.    [provider]  lidocaine (XYLOCAINE) 2 % solution Use as directed 15 mLs in the mouth or throat as needed for mouth pain. 05/28/18   Maczis, Barth Kirks, PA-C  methocarbamol (ROBAXIN)  500 MG tablet Take 1 tablet (500 mg total) by mouth 3 (three) times daily. Patient not taking: Reported on 04/04/2017 11/19/16   Lavina Hamman, MD  metroNIDAZOLE (FLAGYL) 500 MG tablet Take 1 tablet (500 mg total) by mouth 2 (two) times daily. 04/18/19   Albrizze, Kaitlyn E, PA-C  naproxen (NAPROSYN) 375 MG tablet Take 1 tablet (375 mg total) by mouth 2 (two) times daily. 07/19/17   Doristine Devoid, PA-C  pantoprazole (PROTONIX) 20 MG tablet Take 1 tablet (20 mg total) by mouth daily. 06/22/19   Muthersbaugh, [name], PA-C  promethazine (PHENERGAN) 25 MG suppository Place 1 suppository (25 mg total) rectally every 6 (six) hours as needed for nausea or vomiting. 06/22/19   Muthersbaugh, [name], PA-C  sucralfate (CARAFATE) 1 g tablet  Take 1 tablet (1 g total) by mouth 4 (four) times daily -  with meals and at bedtime. 06/22/19   Muthersbaugh, [name], PA-C  sulfacetamide (BLEPH-10) 10 % ophthalmic solution Place 1-2 drops into the left eye every 4 (four) hours. Patient not taking: Reported on 07/19/2017 04/04/17   Delia Heady, PA-C  prochlorperazine (COMPAZINE) 25 MG suppository Place 1 suppository (25 mg total) rectally every 12 (twelve) hours as needed for nausea or vomiting. Patient not taking: Reported on 04/04/2017 11/08/16 06/22/19  Lorin Glass, PA-C    Allergies    Shrimp [shellfish allergy] and Cabbage  Review of Systems   Review of Systems  Constitutional: Negative for chills and fever.  HENT: Negative for sore throat.   Eyes: Negative for redness.  Respiratory: Negative for shortness of breath.   Cardiovascular: Negative for chest pain.  Gastrointestinal: Positive for abdominal pain, nausea and vomiting.  Endocrine: Negative for polyuria.  Genitourinary: Negative for dysuria, flank pain, vaginal bleeding and vaginal discharge.  Musculoskeletal: Negative for back pain and neck pain.  Skin: Negative for rash.  Neurological: Negative for headaches.  Hematological: Does not bruise/bleed easily.  Psychiatric/Behavioral: The patient is nervous/anxious.     Physical Exam Updated Vital Signs BP (!) 136/111   Pulse (!) 103   Temp 98.4 F (36.9 C) (Oral)   Resp (!) 22   Ht 1.524 m (5')   Wt 66.7 kg   SpO2 99%   BMI 28.71 kg/m   Physical Exam Vitals and nursing note reviewed.  Constitutional:      Appearance: Normal appearance. She is well-developed.  HENT:     Head: Atraumatic.     Nose: Nose normal.     Mouth/Throat:     Mouth: Mucous membranes are moist.  Eyes:     General: No scleral icterus.    Conjunctiva/sclera: Conjunctivae normal.  Neck:     Trachea: No tracheal deviation.  Cardiovascular:     Rate and Rhythm: Normal rate and regular rhythm.     Pulses: Normal pulses.      Heart sounds: Normal heart sounds. No murmur. No friction rub. No gallop.   Pulmonary:     Effort: Pulmonary effort is normal. No respiratory distress.     Breath sounds: Normal breath sounds.  Abdominal:     General: Bowel sounds are normal. There is no distension.     Palpations: Abdomen is soft.     Tenderness: There is abdominal tenderness. There is no guarding.     Comments: Epigastric tenderness.   Genitourinary:    Comments: No cva tenderness.  Musculoskeletal:        General: No swelling.     Cervical back: Normal range of  motion and neck supple. No rigidity. No muscular tenderness.  Skin:    General: Skin is warm and dry.     Findings: No rash.  Neurological:     Mental Status: She is alert.     Comments: Alert, speech normal.   Psychiatric:     Comments: Very anxious appearing, yelling out, crying/tearful.     ED Results / Procedures / Treatments   Labs (all labs ordered are listed, but only abnormal results are displayed) Results for orders placed or performed during the hospital encounter of 06/24/19  CBC  Result Value Ref Range   WBC 6.9 4.0 - 10.5 K/uL   RBC 4.37 3.87 - 5.11 MIL/uL   Hemoglobin 12.3 12.0 - 15.0 g/dL   HCT 39.3 36.0 - 46.0 %   MCV 89.9 80.0 - 100.0 fL   MCH 28.1 26.0 - 34.0 pg   MCHC 31.3 30.0 - 36.0 g/dL   RDW 12.6 11.5 - 15.5 %   Platelets 450 (H) 150 - 400 K/uL   nRBC 0.0 0.0 - 0.2 %  CMET  Result Value Ref Range   Sodium 141 135 - 145 mmol/L   Potassium 3.0 (L) 3.5 - 5.1 mmol/L   Chloride 104 98 - 111 mmol/L   CO2 25 22 - 32 mmol/L   Glucose, Bld 99 70 - 99 mg/dL   BUN 15 6 - 20 mg/dL   Creatinine, Ser 1.00 0.44 - 1.00 mg/dL   Calcium 9.5 8.9 - 10.3 mg/dL   Total Protein 8.5 (H) 6.5 - 8.1 g/dL   Albumin 4.8 3.5 - 5.0 g/dL   AST 37 15 - 41 U/L   ALT 31 0 - 44 U/L   Alkaline Phosphatase 44 38 - 126 U/L   Total Bilirubin 1.7 (H) 0.3 - 1.2 mg/dL   GFR calc non Af Amer >60 >60 mL/min   GFR calc Af Amer >60 >60 mL/min   Anion gap  12 5 - 15  Lipase  Result Value Ref Range   Lipase 19 11 - 51 U/L   US Pelvis Complete  Result Date: 06/22/2019 CLINICAL DATA:  Abdominal pain EXAM: TRANSABDOMINAL ULTRASOUND OF PELVIS DOPPLER ULTRASOUND OF OVARIES TECHNIQUE: Transabdominal ultrasound examination of the pelvis was performed including evaluation of the uterus, ovaries, adnexal regions, and pelvic cul-de-sac. Color and duplex Doppler ultrasound was utilized to evaluate blood flow to the ovaries. COMPARISON:  CT Oct 31, 2018 FINDINGS: Uterus Measurements: 9.5 x 5.6 x 6.0 cm = volume: 162.5 mL. There is a heterogeneous posteriorly shadowing intramural fibroid along the left posterior body and fundus of the uterus. Fibroid measures approximately 4.2 x 5.1 x 4.1 cm. Corresponds well to the abnormality seen on comparison CT. Endometrium The endometrium is poorly visualized due to shadowing from the large uterine fundus. Right ovary Measurements: 4.8 x 2.5 x 3.6 cm = volume: 22.6 mL. Simple appearing follicular cyst in the right ovary. Left ovary Measurements: 3.9 x 1.9 x 2.6 cm = volume: 9.7 mL. Normal appearance/no adnexal mass. Pulsed Doppler evaluation demonstrates normal low-resistance arterial and venous waveforms in both ovaries. Other: Initial requisition was for both transabdominal and transvaginal imaging. Patient was unable to proceed with transvaginal imaging secondary to pain during the initial imaging. IMPRESSION: Unable to acquire transvaginal images with patient terminating the examination early secondary to pain. Heterogeneously shadowing 5.1 cm intramural fibroid occupying the posterior uterine body and fundus. Unremarkable Doppler evaluation of the ovaries. Electronically Signed   By: Elwin Sleight.D.  On: 06/22/2019 01:30   [place] Art/Ven Flow Abd Pelv Doppler  Result Date: 06/22/2019 CLINICAL DATA:  Abdominal pain EXAM: TRANSABDOMINAL ULTRASOUND OF PELVIS DOPPLER ULTRASOUND OF OVARIES TECHNIQUE: Transabdominal ultrasound  examination of the pelvis was performed including evaluation of the uterus, ovaries, adnexal regions, and pelvic cul-de-sac. Color and duplex Doppler ultrasound was utilized to evaluate blood flow to the ovaries. COMPARISON:  CT Oct 31, 2018 FINDINGS: Uterus Measurements: 9.5 x 5.6 x 6.0 cm = volume: 162.5 mL. There is a heterogeneous posteriorly shadowing intramural fibroid along the left posterior body and fundus of the uterus. Fibroid measures approximately 4.2 x 5.1 x 4.1 cm. Corresponds well to the abnormality seen on comparison CT. Endometrium The endometrium is poorly visualized due to shadowing from the large uterine fundus. Right ovary Measurements: 4.8 x 2.5 x 3.6 cm = volume: 22.6 mL. Simple appearing follicular cyst in the right ovary. Left ovary Measurements: 3.9 x 1.9 x 2.6 cm = volume: 9.7 mL. Normal appearance/no adnexal mass. Pulsed Doppler evaluation demonstrates normal low-resistance arterial and venous waveforms in both ovaries. Other: Initial requisition was for both transabdominal and transvaginal imaging. Patient was unable to proceed with transvaginal imaging secondary to pain during the initial imaging. IMPRESSION: Unable to acquire transvaginal images with patient terminating the examination early secondary to pain. Heterogeneously shadowing 5.1 cm intramural fibroid occupying the posterior uterine body and fundus. Unremarkable Doppler evaluation of the ovaries. Electronically Signed   By: Lovena Le M.D.   On: 06/22/2019 01:30    EKG None  Radiology No results found.  Procedures Procedures (including critical care time)  Medications Ordered in ED Medications  sodium chloride 0.9 % bolus 1,000 mL (has no administration in time range)  LORazepam (ATIVAN) injection 1 mg (has no administration in time range)  famotidine (PEPCID) IVPB 20 mg premix (has no administration in time range)  droperidol (INAPSINE) 2.5 MG/ML injection 2.5 mg (has no administration in time range)     ED Course  I have reviewed the triage vital signs and the nursing notes.  Pertinent labs & imaging results that were available during my care of the patient were reviewed by me and considered in my medical decision making (see chart for details).    MDM Rules/Calculators/A&P                      Iv ns bolus. Labs sent. Ativan 1 mg iv. Droperidol iv.   Reviewed nursing notes and prior charts for additional history.  From 3 days ago, labs largely unremarkable, preg neg, lipase normal.   Labs reviewed/interpreted by me - lipase normal, wbc normal. k low, kcl po.  Trial of po fluids.   Pt is tolerating po. Recheck abd soft nt. No vomiting.   Patient currently appears stable for d/c.   Return precautions provided.       Final Clinical Impression(s) / ED Diagnoses Final diagnoses:  None    Rx / DC Orders ED Discharge Orders    None       Lajean Saver, MD 06/24/19 1258

## 2019-06-24 NOTE — ED Triage Notes (Signed)
Pt verbalizes was seen here for same on Christmas day; complaint of severe left abd pain with n/v; "have not been able to poop; I need to poop."

## 2019-06-24 NOTE — Discharge Instructions (Addendum)
It was our pleasure to provide your ER care today - we hope that you feel better.  Rest. Drink plenty of fluids. Please note that increasingly we are seeing marijuana as a cause of a recurrent abdominal pain and vomiting syndrome called 'Cannabinoid Hyperemesis Syndrome' - in these cases, avoid marijuana use provides a permanent cure for these recurrent symptoms.   Follow up with primary care doctor in the next 1-2 days if symptoms fail to improve/resolve.  From today's labs, your potassium level is low - eat plenty of fruits and vegetables, and follow up with your doctor in 1-2 weeks.   Return to ER if worse, new symptoms, fevers, persistent vomiting, new or severe pain, or other concern.  You were given medication in the ER - no driving for the next 8 hours.

## 2019-06-25 ENCOUNTER — Emergency Department (HOSPITAL_COMMUNITY): Admission: EM | Admit: 2019-06-25 | Discharge: 2019-06-25 | Discharge status: Absent without leave | Payer: 59

## 2019-06-25 ENCOUNTER — Other Ambulatory Visit: Payer: Self-pay

## 2019-06-25 ENCOUNTER — Encounter (HOSPITAL_COMMUNITY): Payer: Self-pay

## 2019-06-25 ENCOUNTER — Emergency Department (HOSPITAL_COMMUNITY)
Admission: EM | Admit: 2019-06-25 | Discharge: 2019-06-26 | Disposition: A | Discharge status: Home / Self Care | Payer: 59 | Attending: Emergency Medicine | Admitting: Emergency Medicine

## 2019-06-25 DIAGNOSIS — Z87891 Personal history of nicotine dependence: Secondary | ICD-10-CM | POA: Diagnosis not present

## 2019-06-25 DIAGNOSIS — J45909 Unspecified asthma, uncomplicated: Secondary | ICD-10-CM | POA: Insufficient documentation

## 2019-06-25 DIAGNOSIS — E876 Hypokalemia: Secondary | ICD-10-CM | POA: Diagnosis not present

## 2019-06-25 DIAGNOSIS — R112 Nausea with vomiting, unspecified: Secondary | ICD-10-CM | POA: Insufficient documentation

## 2019-06-25 DIAGNOSIS — F1290 Cannabis use, unspecified, uncomplicated: Secondary | ICD-10-CM | POA: Insufficient documentation

## 2019-06-25 DIAGNOSIS — Z79899 Other long term (current) drug therapy: Secondary | ICD-10-CM | POA: Diagnosis not present

## 2019-06-25 LAB — COMPREHENSIVE METABOLIC PANEL
ALT: 27 U/L (ref 0–44)
AST: 25 U/L (ref 15–41)
Albumin: 4.2 g/dL (ref 3.5–5.0)
Alkaline Phosphatase: 38 U/L (ref 38–126)
Anion gap: 11 (ref 5–15)
BUN: 10 mg/dL (ref 6–20)
CO2: 23 mmol/L (ref 22–32)
Calcium: 9.3 mg/dL (ref 8.9–10.3)
Chloride: 104 mmol/L (ref 98–111)
Creatinine, Ser: 0.79 mg/dL (ref 0.44–1.00)
GFR calc Af Amer: >60 mL/min (ref >60)
GFR calc non Af Amer: >60 mL/min (ref >60)
Glucose, Bld: 99 mg/dL (ref 70–99)
Potassium: 3 mmol/L — ABNORMAL LOW (ref 3.5–5.1)
Sodium: 138 mmol/L (ref 135–145)
Total Bilirubin: 1.2 mg/dL (ref 0.3–1.2)
Total Protein: 7.7 g/dL (ref 6.5–8.1)

## 2019-06-25 LAB — LIPASE, BLOOD: Lipase: 28 U/L (ref 11–51)

## 2019-06-25 LAB — URINALYSIS, ROUTINE W REFLEX MICROSCOPIC
Bilirubin Urine: NEGATIVE
Glucose, UA: NEGATIVE mg/dL
Hgb urine dipstick: NEGATIVE
Ketones, ur: 80 mg/dL — AB
Leukocytes,Ua: NEGATIVE
Nitrite: NEGATIVE
Protein, ur: 100 mg/dL — AB
Specific Gravity, Urine: 1.038 — ABNORMAL HIGH (ref 1.005–1.030)
pH: 5 (ref 5.0–8.0)

## 2019-06-25 LAB — GC/CHLAMYDIA PROBE AMP (~~LOC~~) NOT AT ARMC
Chlamydia: NEGATIVE
Neisseria Gonorrhea: NEGATIVE

## 2019-06-25 LAB — CBC
HCT: 33.6 % — ABNORMAL LOW (ref 36.0–46.0)
Hemoglobin: 10.8 g/dL — ABNORMAL LOW (ref 12.0–15.0)
MCH: 28.8 pg (ref 26.0–34.0)
MCHC: 32.1 g/dL (ref 30.0–36.0)
MCV: 89.6 fL (ref 80.0–100.0)
Platelets: 362 10*3/uL (ref 150–400)
RBC: 3.75 MIL/uL — ABNORMAL LOW (ref 3.87–5.11)
RDW: 12.7 % (ref 11.5–15.5)
WBC: 5 10*3/uL (ref 4.0–10.5)
nRBC: 0 % (ref 0.0–0.2)

## 2019-06-25 LAB — POC OCCULT BLOOD, ED: Fecal Occult Bld: NEGATIVE

## 2019-06-25 MED ORDER — SODIUM CHLORIDE 0.9 % IV BOLUS
1000.0000 mL | Freq: Once | INTRAVENOUS | Status: AC
  Administered 2019-06-26: 1000 mL via INTRAVENOUS

## 2019-06-25 MED ORDER — SODIUM CHLORIDE 0.9 % IV BOLUS
1000.0000 mL | Freq: Once | INTRAVENOUS | Status: AC
  Administered 2019-06-25: 1000 mL via INTRAVENOUS

## 2019-06-25 MED ORDER — HALOPERIDOL LACTATE 5 MG/ML IJ SOLN
2.0000 mg | Freq: Once | INTRAMUSCULAR | Status: AC
  Administered 2019-06-25: 2 mg via INTRAVENOUS
  Filled 2019-06-25: qty 1

## 2019-06-25 MED ORDER — ONDANSETRON HCL 4 MG/2ML IJ SOLN
4.0000 mg | Freq: Once | INTRAMUSCULAR | Status: AC
  Administered 2019-06-25: 4 mg via INTRAVENOUS
  Filled 2019-06-25: qty 2

## 2019-06-25 NOTE — ED Provider Notes (Signed)
[facility] DEPT Provider Note   CSN: [id number] Arrival date & time: 06/25/19  1930     History Chief Complaint  Patient presents with  . Abdominal Pain    Carrie Guerra is a 33 y.o. female with a history of asthma, uterine fibroids, cannabis use disorder, and cannabis hyperemesis syndrome who presents to the emergency department with a chief complaint of abdominal pain.  The patient reports that she has been having diffuse abdominal pain that is worse in the left lower quadrant since 12/25.  She reports countless episodes of nonbloody, nonbilious vomiting, nausea for the last 5 days.  She reports that she was feeling constipated, but had 2 episodes of loose, black stool earlier today.  She reports that she did take 1 dose of Pepto-Bismol several days ago and was not having black stool prior to taking Pepto-Bismol.  She denies fevers, chills, back pain, dysuria, hematuria, vaginal pain, bleeding, or discharge, chest pain, shortness of breath, rectal pain.  She has been seen for the same symptoms on 12/25 and 12/28.  Reports that she was discharged with medication, but has been unable to keep it down due to vomiting.  She reports intermittent alcohol use.  No alcohol use recently.  She also endorses marijuana use.  States that she smokes a couple of times a week.  She denies any other illicit or recreational drug use.  No history of abdominal surgery.  No concerns for STIs.    The history is provided by the patient. No language interpreter was used.       Past Medical History:  Diagnosis Date  . Asthma   . Cannabis hyperemesis syndrome concurrent with and due to cannabis abuse ([place]) 11/09/2016  . Fibroids     Patient Active Problem List   Diagnosis Date Noted  . Vomiting 11/17/2016  . Hypokalemia 11/17/2016  . Cannabis hyperemesis syndrome concurrent with and due to cannabis abuse ([place]) 11/09/2016    Past Surgical History:  Procedure  Laterality Date  . NO PAST SURGERIES       OB History    Gravida  0   Para  0   Term  0   Preterm  0   AB  0   Living  0     SAB  0   TAB  0   Ectopic  0   Multiple  0   Live Births  0           Family History  Problem Relation Age of Onset  . Diabetes Mother   . Diabetes Other     Social History   Tobacco Use  . Smoking status: Former Smoker    Types: Cigarettes  . Smokeless tobacco: Never Used  . Tobacco comment: 11/17/2016 "someday smoker when I did smoke"  Substance Use Topics  . Alcohol use: Yes    Comment: 11/17/2016 "might drink 2-3 days/month"  . Drug use: Yes    Types: Marijuana    Home Medications Prior to Admission medications   Medication Sig Start Date End Date Taking? Authorizing Provider  aspirin-sod bicarb-citric acid (ALKA-SELTZER) 325 MG TBEF tablet Take 325 mg by mouth every 6 (six) hours as needed (heart burn).    Yes [provider]  ibuprofen (ADVIL,MOTRIN) 200 MG tablet Take 400 mg by mouth every 8 (eight) hours as needed for moderate pain.    Yes [provider]  pantoprazole (PROTONIX) 20 MG tablet Take 1 tablet (20 mg total) by mouth  daily. 06/22/19  Yes Muthersbaugh, [name], PA-C  sucralfate (CARAFATE) 1 g tablet Take 1 tablet (1 g total) by mouth 4 (four) times daily -  with meals and at bedtime. 06/22/19  Yes Muthersbaugh, [name], PA-C  potassium chloride SA (KLOR-CON) 20 MEQ tablet Take 1 tablet (20 mEq total) by mouth 2 (two) times daily for 5 days. 06/26/19 07/01/19  Ginger Leeth A, PA-C  promethazine (PHENERGAN) 25 MG suppository Place 1 suppository (25 mg total) rectally every 6 (six) hours as needed for nausea or vomiting. 06/26/19   Lakecia Deschamps A, PA-C  prochlorperazine (COMPAZINE) 25 MG suppository Place 1 suppository (25 mg total) rectally every 12 (twelve) hours as needed for nausea or vomiting. Patient not taking: Reported on 04/04/2017 11/08/16 06/22/19  Lorin Glass, PA-C    Allergies     Shrimp [shellfish allergy] and Cabbage  Review of Systems   Review of Systems  Constitutional: Positive for chills. Negative for activity change and fever.  HENT: Negative for congestion and sore throat.   Respiratory: Negative for shortness of breath.   Cardiovascular: Negative for chest pain.  Gastrointestinal: Positive for abdominal distention, abdominal pain, constipation, diarrhea, nausea and vomiting. Negative for anal bleeding, blood in stool and rectal pain.  Genitourinary: Negative for dysuria, enuresis, flank pain, frequency, pelvic pain, urgency, vaginal bleeding, vaginal discharge and vaginal pain.  Musculoskeletal: Negative for back pain, myalgias, neck pain and neck stiffness.  Skin: Negative for rash.  Allergic/Immunologic: Negative for immunocompromised state.  Neurological: Negative for dizziness, seizures, syncope, weakness and headaches.  Psychiatric/Behavioral: Negative for confusion.    Physical Exam Updated Vital Signs BP 138/90 (BP Location: Left Arm)   Pulse 93   Temp 98.4 F (36.9 C) (Oral)   Resp 14   Ht 5\' 4"  (1.626 m)   Wt 66.7 kg   SpO2 100%   BMI 25.23 kg/m   Physical Exam Vitals and nursing note reviewed. Exam conducted with a chaperone present.  Constitutional:      Appearance: She is not ill-appearing or toxic-appearing.     Comments: Tearful and anxious appearing.  HENT:     Head: Normocephalic.     Mouth/Throat:     Comments: Mucous membranes are dry. Eyes:     Conjunctiva/sclera: Conjunctivae normal.  Cardiovascular:     Rate and Rhythm: Normal rate and regular rhythm.     Heart sounds: No murmur. No friction rub. No gallop.   Pulmonary:     Effort: Pulmonary effort is normal. No respiratory distress.     Breath sounds: No stridor. No wheezing, rhonchi or rales.  Chest:     Chest wall: No tenderness.  Abdominal:     General: There is no distension.     Palpations: Abdomen is soft. There is no mass.     Tenderness: There is  abdominal tenderness. There is no right CVA tenderness, left CVA tenderness, guarding or rebound.     Hernia: No hernia is present.     Comments: Diffusely tender to palpation throughout the abdomen.  She voluntarily guards, but has no rebound.  No tenderness over McBurney's point.  No CVA tenderness.  Negative Murphy sign.   Genitourinary:    Comments: Chaperoned exam.  Normal rectal tone.  Soft brown stool noted on DRE.  No abnormalities to the anus. Musculoskeletal:     Cervical back: Neck supple.  Skin:    General: Skin is warm.     Capillary Refill: Capillary refill takes 2 to 3 seconds.  Findings: No rash.  Neurological:     Mental Status: She is alert.  Psychiatric:        Behavior: Behavior normal.     ED Results / Procedures / Treatments   Labs (all labs ordered are listed, but only abnormal results are displayed) Labs Reviewed  COMPREHENSIVE METABOLIC PANEL - Abnormal; Notable for the following components:      Result Value   Potassium 3.0 (*)    All other components within normal limits  CBC - Abnormal; Notable for the following components:   RBC 3.75 (*)    Hemoglobin 10.8 (*)    HCT 33.6 (*)    All other components within normal limits  URINALYSIS, ROUTINE W REFLEX MICROSCOPIC - Abnormal; Notable for the following components:   Specific Gravity, Urine 1.038 (*)    Ketones, ur 80 (*)    Protein, ur 100 (*)    Bacteria, UA RARE (*)    All other components within normal limits  LIPASE, BLOOD  POC OCCULT BLOOD, ED    EKG None  Radiology No results found.  Procedures Procedures (including critical care time)  Medications Ordered in ED Medications  sodium chloride 0.9 % bolus 1,000 mL (1,000 mLs Intravenous New Bag/Given (Non-Interop) 06/25/19 2240)  haloperidol lactate (HALDOL) injection 2 mg (2 mg Intravenous Given 06/25/19 2320)  sodium chloride 0.9 % bolus 1,000 mL (1,000 mLs Intravenous New Bag/Given (Non-Interop) 06/26/19 0021)  ondansetron  (ZOFRAN) injection 4 mg (4 mg Intravenous Given 06/25/19 2320)    ED Course  I have reviewed the triage vital signs and the nursing notes.  Pertinent labs & imaging results that were available during my care of the patient were reviewed by me and considered in my medical decision making (see chart for details).  Clinical Course as of Jun 25 305  Wed Jun 26, 2019  0302 Patient recheck.  She was able to independently ambulate to the bathroom.  Reports that she is still continuing to feel well.  She has drank an entire cup of water without vomiting.  She has some remaining fluids, but feels that she is ready for discharge.   [MM]    Clinical Course User Index [MM] Richell Corker, Laymond Purser, PA-C   MDM Rules/Calculators/A&P                      33 year old female with a history of asthma, uterine fibroids, cannabis use disorder, and cannabis hyperemesis syndrome presenting with nausea, vomiting, and generalized abdominal pain for the last 5 days.  This is her third visit to the ER for the same.  Today, she has had 2 episodes of loose, nonbloody stools.  On exam, she is anxious, tearful, and tender to palpation generally throughout the abdomen.  She voluntarily guards, but has no involuntary guarding or rebound.  She has no history of abdominal surgery.  She does appear dehydrated, but is not tachycardic.  She is afebrile.  Her symptoms are concerning for cannabinoid hyperemesis syndrome given that the only alleviating factor is taking hot baths and showers.   EKG was obtained.  No prolonged QTC.  We will treat the patient with 2 IV fluid boluses, Zofran, and Haldol.  She has hypokalemic at 3.0, secondary to vomiting.  No other electrolyte derangements.  On reevaluation, patient's pain has resolved.  She has had no vomiting since arrival in the ER.  She reports that she is feeling markedly better.  On reexamination of the abdomen, she has no  focal tenderness.  She is no longer tearful or anxious appearing.   Will fluid challenge the patient.  At this time, feel that no further urgent or emergent imaging is indicated.  Low suspicion for diverticulitis, GI bleed, appendicitis, cholecystitis, ectopic pregnancy, PID, or pyelonephritis.  On second reevaluation, the patient reports that she continues to feel much better.  She has been successfully p.o. fluid challenge.  We will plan for discharge after IV fluids finished.  Recommend cessation of cannabis use.  Will recommend that she follow-up with primary care.  ER return precautions given.  She is hemodynamically stable and in no acute distress.  Safe for discharge home with outpatient follow-up as needed.  Final Clinical Impression(s) / ED Diagnoses Final diagnoses:  Cannabinoid hyperemesis syndrome  Acute hypokalemia    Rx / DC Orders ED Discharge Orders         Ordered    potassium chloride SA (KLOR-CON) 20 MEQ tablet  2 times daily     06/26/19 0233    promethazine (PHENERGAN) 25 MG suppository  Every 6 hours PRN     06/26/19 0233           Joanne Gavel, PA-C 06/26/19 0306    Mesner, Corene Cornea, MD 06/26/19 [phone]

## 2019-06-25 NOTE — ED Triage Notes (Signed)
Pt coming in c/o abdominal pain (LLQ) that started on Christmas. N/V and "cant poop". 10/10 pain

## 2019-06-26 MED ORDER — PROMETHAZINE HCL 25 MG RE SUPP: 25.0000 mg | Freq: Four times a day (QID) | RECTAL | 0 refills | Status: DC | PRN

## 2019-06-26 MED ORDER — POTASSIUM CHLORIDE CRYS ER 20 MEQ PO TBCR: 20.0000 meq | EXTENDED_RELEASE_TABLET | Freq: Two times a day (BID) | ORAL | 0 refills | Status: DC

## 2019-06-26 NOTE — ED Notes (Signed)
IV line flushed, pt helped to readjust for better flow of IV fluids. Provided pt with warm blanket. Will continue to monitor

## 2019-06-26 NOTE — ED Notes (Signed)
Pt up to restroom without assistance 

## 2019-06-26 NOTE — ED Notes (Signed)
Pt given ice water and advised to take small sips

## 2019-06-26 NOTE — Discharge Instructions (Signed)
Thank you for allowing me to care for you today in the Emergency Department.   I would strongly recommending avoiding all marijuana/cannabis use as this is likely the source of your symptoms.  Follow-up with primary care for recheck of your symptoms.  If you do not have a primary care provider, call the number on your discharge paperwork to get established with 1.  Insert 1 Phenergan suppository into the rectum every 6 hours as needed for nausea or vomiting.  I would avoid taking ibuprofen for now as this may cause worsening irritation to the lining of the stomach.  Make sure that you are drinking plenty of fluids.  Take 2 tablets of potassium by mouth 2 times daily for the next 5 days since your potassium is low from vomiting.  Return the emergency department if you develop uncontrollable vomiting, if you stop producing urine, if you develop black or bloody vomiting or stool, if you pass out, or develop other new, concerning symptoms.

## 2019-06-28 DIAGNOSIS — R17 Unspecified jaundice: Secondary | ICD-10-CM

## 2019-06-28 DIAGNOSIS — E876 Hypokalemia: Secondary | ICD-10-CM

## 2019-06-28 DIAGNOSIS — D649 Anemia, unspecified: Secondary | ICD-10-CM

## 2019-06-28 DIAGNOSIS — R748 Abnormal levels of other serum enzymes: Secondary | ICD-10-CM

## 2019-06-28 HISTORY — DX: Abnormal levels of other serum enzymes: R74.8

## 2019-06-28 HISTORY — DX: Anemia, unspecified: D64.9

## 2019-06-28 HISTORY — DX: Hypokalemia: E87.6

## 2019-06-28 HISTORY — DX: Unspecified jaundice: R17

## 2019-07-01 ENCOUNTER — Other Ambulatory Visit: Payer: Self-pay

## 2019-07-02 ENCOUNTER — Ambulatory Visit: Payer: 59 | Admitting: Obstetrics and Gynecology

## 2019-07-02 ENCOUNTER — Encounter: Payer: Self-pay | Admitting: Obstetrics and Gynecology

## 2019-07-02 ENCOUNTER — Other Ambulatory Visit (HOSPITAL_COMMUNITY)
Admission: RE | Admit: 2019-07-02 | Discharge: 2019-07-02 | Disposition: A | Discharge status: Home / Self Care | Payer: 59 | Source: Ambulatory Visit | Attending: Obstetrics and Gynecology | Admitting: Obstetrics and Gynecology

## 2019-07-02 VITALS — BP 120/88 | HR 76 | Temp 97.6°F | Resp 20 | Ht 60.5 in | Wt 143.8 lb

## 2019-07-02 DIAGNOSIS — K5900 Constipation, unspecified: Secondary | ICD-10-CM | POA: Diagnosis not present

## 2019-07-02 DIAGNOSIS — Z124 Encounter for screening for malignant neoplasm of cervix: Secondary | ICD-10-CM | POA: Diagnosis present

## 2019-07-02 DIAGNOSIS — D219 Benign neoplasm of connective and other soft tissue, unspecified: Secondary | ICD-10-CM | POA: Diagnosis not present

## 2019-07-02 NOTE — Patient Instructions (Addendum)
Total Laparoscopic Hysterectomy, Care After This sheet gives you information about how to care for yourself after your procedure. Your health care provider may also give you more specific instructions. If you have problems or questions, contact your health care provider. What can I expect after the procedure? After the procedure, it is common to have:  Pain and bruising around your incisions.  A sore throat, if a breathing tube was used during surgery.  Fatigue.  Poor appetite.  Less interest in sex. If your ovaries were also removed, it is also common to have symptoms of menopause such as hot flashes, night sweats, and lack of sleep (insomnia). Follow these instructions at home: Bathing  Do not take baths, swim, or use a hot tub until your health care provider approves. You may need to only take showers for 2-3 weeks.  Keep your bandage (dressing) dry until your health care provider says it can be removed. Incision care   Follow instructions from your health care provider about how to take care of your incisions. Make sure you: ? Wash your hands with soap and water before you change your dressing. If soap and water are not available, use hand sanitizer. ? Change your dressing as told by your health care provider. ? Leave stitches (sutures), skin glue, or adhesive strips in place. These skin closures may need to stay in place for 2 weeks or longer. If adhesive strip edges start to loosen and curl up, you may trim the loose edges. Do not remove adhesive strips completely unless your health care provider tells you to do that.  Check your incision area every day for signs of infection. Check for: ? Redness, swelling, or pain. ? Fluid or blood. ? Warmth. ? Pus or a bad smell. Activity  Get plenty of rest and sleep.  Do not lift anything that is heavier than 10 lbs (4.5 kg) for one month after surgery, or as long as told by your health care provider.  Do not drive or use heavy  machinery while taking prescription pain medicine.  Do not drive for 24 hours if you were given a medicine to help you relax (sedative).  Return to your normal activities as told by your health care provider. Ask your health care provider what activities are safe for you. Lifestyle   Do not use any products that contain nicotine or tobacco, such as cigarettes and e-cigarettes. These can delay healing. If you need help quitting, ask your health care provider.  Do not drink alcohol until your health care provider approves. General instructions  Do not douche, use tampons, or have sex for at least 6 weeks, or as told by your health care provider.  Take over-the-counter and prescription medicines only as told by your health care provider.  To monitor yourself for a fever, take your temperature at least once a day during recovery.  If you struggle with physical or emotional changes after your procedure, speak with your health care provider or a therapist.  To prevent or treat constipation while you are taking prescription pain medicine, your health care provider may recommend that you: ? Drink enough fluid to keep your urine clear or pale yellow. ? Take over-the-counter or prescription medicines. ? Eat foods that are high in fiber, such as fresh fruits and vegetables, whole grains, and beans. ? Limit foods that are high in fat and processed sugars, such as fried and sweet foods.  Keep all follow-up visits as told by your health care provider.   This is important. Contact a health care provider if:  You have chills or a fever.  You have redness, swelling, or pain around an incision.  You have fluid or blood coming from an incision.  Your incision feels warm to the touch.  You have pus or a bad smell coming from an incision.  An incision breaks open.  You feel dizzy or light-headed.  You have pain or bleeding when you urinate.  You have diarrhea, nausea, or vomiting that does not  go away.  You have abnormal vaginal discharge.  You have a rash.  You have pain that does not get better with medicine. Get help right away if:  You have a fever and your symptoms suddenly get worse.  You have severe abdominal pain.  You have chest pain.  You have shortness of breath.  You faint.  You have pain, swelling, or redness on your leg.  You have heavy vaginal bleeding with blood clots. Summary  After the procedure it is common to have abdominal pain. Your provider will give you medication for this.  Do not take baths, swim, or use a hot tub until your health care provider approves.  Do not lift anything that is heavier than 10 lbs (4.5 kg) for one month after surgery, or as long as told by your health care provider.  Notify your provider if you have any signs or symptoms of infection after the procedure. This information is not intended to replace advice given to you by your health care provider. Make sure you discuss any questions you have with your health care provider. Document Revised: 05/26/2017 Document Reviewed: 08/24/2016 Elsevier Patient Education  [place]. Polyethylene Glycol powder What is this medicine? POLYETHYLENE GLYCOL 3350 (pol ee ETH i leen; GLYE col) powder is a laxative used to treat constipation. It increases the amount of water in the stool. Bowel movements become easier and more frequent. This medicine may be used for other purposes; ask your health care provider or pharmacist if you have questions. COMMON BRAND NAME(S): Sharlyn Bologna, GlycoLax, Healthylax, MiraLax, Smooth LAX, Vita Health What should I tell my health care provider before I take this medicine? They need to know if you have any of these conditions: a history of blockage of the stomach or intestine current abdomen distension or pain difficulty swallowing diverticulitis, ulcerative colitis, or other chronic bowel disease phenylketonuria an unusual or allergic  reaction to polyethylene glycol, other medicines, dyes, or preservatives pregnant or trying to get pregnant breast-feeding How should I use this medicine? Take this medicine by mouth. The bottle has a measuring cap that is marked with a line. Pour the powder into the cap up to the marked line (the dose is about 1 heaping tablespoon). Add the powder in the cap to a full glass (4 to 8 ounces or 120 to 240 mL) of water, juice, soda, coffee or tea. Mix the powder well. Ensure that the powder is fully dissolved. Do not drink if there are any clumps. Drink the solution. Take exactly as directed. Do not take your medicine more often than directed. Talk to your pediatrician regarding the use of this medicine in children. Special care may be needed. Overdosage: If you think you have taken too much of this medicine contact a poison control center or emergency room at once. NOTE: This medicine is only for you. Do not share this medicine with others. What if I miss a dose? If you miss a dose, take it as  soon as you can. If it is almost time for your next dose, take only that dose. Do not take double or extra doses. What may interact with this medicine? Interactions are not expected. This list may not describe all possible interactions. Give your health care provider a list of all the medicines, herbs, non-prescription drugs, or dietary supplements you use. Also tell them if you smoke, drink alcohol, or use illegal drugs. Some items may interact with your medicine. What should I watch for while using this medicine? Do not use for more than 2 weeks without advice from your doctor or health care professional. It can take 2 to 4 days to have a bowel movement and to experience improvement in constipation. See your health care professional for any changes in bowel habits, including constipation, that are severe or last longer than three weeks. Always take this medicine with plenty of water. What side effects may I  notice from receiving this medicine? Side effects that you should report to your doctor or health care professional as soon as possible: diarrhea difficulty breathing itching of the skin, hives, or skin rash severe bloating, pain, or distension of the stomach vomiting Side effects that usually do not require medical attention (report to your doctor or health care professional if they continue or are bothersome): bloating or gas lower abdominal discomfort or cramps nausea This list may not describe all possible side effects. Call your doctor for medical advice about side effects. You may report side effects to FDA at 1-800-FDA-1088. Where should I keep my medicine? Keep out of the reach of children. Store between 15 and 30 degrees C (59 and 86 degrees F). Throw away any unused medicine after the expiration date. NOTE: This sheet is a summary. It may not cover all possible information. If you have questions about this medicine, talk to your doctor, pharmacist, or health care provider.  2020 Elsevier/Gold Standard (2017-11-30 10:42:01)  Constipation, Adult Constipation is when a person has fewer bowel movements in a week than normal, has difficulty having a bowel movement, or has stools that are dry, hard, or larger than normal. Constipation may be caused by an underlying condition. It may become worse with age if a person takes certain medicines and does not take in enough fluids. Follow these instructions at home: Eating and drinking   Eat foods that have a lot of fiber, such as fresh fruits and vegetables, whole grains, and beans.  Limit foods that are high in fat, low in fiber, or overly processed, such as french fries, hamburgers, cookies, candies, and soda.  Drink enough fluid to keep your urine clear or pale yellow. General instructions  Exercise regularly or as told by your health care provider.  Go to the restroom when you have the urge to go. Do not hold it in.  Take  over-the-counter and prescription medicines only as told by your health care provider. These include any fiber supplements.  Practice pelvic floor retraining exercises, such as deep breathing while relaxing the lower abdomen and pelvic floor relaxation during bowel movements.  Watch your condition for any changes.  Keep all follow-up visits as told by your health care provider. This is important. Contact a health care provider if:  You have pain that gets worse.  You have a fever.  You do not have a bowel movement after 4 days.  You vomit.  You are not hungry.  You lose weight.  You are bleeding from the anus.  You have thin,  pencil-like stools. Get help right away if:  You have a fever and your symptoms suddenly get worse.  You leak stool or have blood in your stool.  Your abdomen is bloated.  You have severe pain in your abdomen.  You feel dizzy or you faint. This information is not intended to replace advice given to you by your health care provider. Make sure you discuss any questions you have with your health care provider. Document Revised: 05/26/2017 Document Reviewed: 12/02/2015 Elsevier Patient Education  2020 [employer].

## 2019-07-02 NOTE — Progress Notes (Signed)
GYNECOLOGY  VISIT   HPI: 34 y.o.   Single  African American  female   [place] with Patient's last menstrual period was 06/18/2019 (approximate).   here for evaluation of fibroids and heavy bleeding. FU from ER visit on 06/22/19. Pelvic US (abdominal) showed left posterior and fundal fibroid 4.2 x 5.1 x 4.1 cm. Her ovaries were normal. Her endometrium was poorly visualized. By prior imaging, she has multiple fibroids.  Documentation of her discomfort with the recent pelvic ultrasound which limited her study.  States she is having torture with her fibroids for 1 - 2 years. Menses every 28 days.  Last 13 - 14 days total with bleeding now.  With her cycles, she is changing a super plus tampon and a pad and changing every other hour.  Clotting.  Having migraines before and after her cycle. She has considered hysterectomy due to the heavy bleeding.  She is already in discussion with her employer about time off from work to accomplish this. She takes 4 Excedrin and hot shower to control the pain.   She had negative GC/CT 06/22/19.  She declines future childbearing.  She and her partner wish to adopt.  She has decreased appetite. She uses Cannibus and has had visits to the ER for nausea and vomiting due to this. She knows she needs to stop.  She denies use of any other drugs.   She has constipation and she is using suppositories to treat.  She has anemia with hx of Hgb 10.9 on 06/21/19 and then recently 12.3 on 06/24/19.  She has migraine HA that precipitates her menstruation and when it is ending also. She does have an aura.   Works at [employer] and doing on [employer].  She does not do heavy lifting.  She stands at work at lot.   New PCP will be:  Dr. Pershing Cox.   She has an appointment on 07/22/19.  GYNECOLOGIC HISTORY: Patient's last menstrual period was 06/18/2019 (approximate). Contraception:  Abstinence--never been SA Menopausal hormone therapy:  n/a Last mammogram:   n/a Last pap smear:  Thinks in past few months.  No abnormal pap.  Female partner.         OB History    Gravida  0   Para  0   Term  0   Preterm  0   AB  0   Living  0     SAB  0   TAB  0   Ectopic  0   Multiple  0   Live Births  0              Patient Active Problem List   Diagnosis Date Noted  . Vomiting 11/17/2016  . Hypokalemia 11/17/2016  . Cannabis hyperemesis syndrome concurrent with and due to cannabis abuse ([place]) 11/09/2016    Past Medical History:  Diagnosis Date  . Asthma   . Cannabis hyperemesis syndrome concurrent with and due to cannabis abuse ([place]) 11/09/2016  . Fibroid   . Fibroids   . Migraines    menstrual migraine wiht aura    Past Surgical History:  Procedure Laterality Date  . broken hand Left    pins placed in left hand  . NO PAST SURGERIES      Current Outpatient Medications  Medication Sig Dispense Refill  . aspirin-sod bicarb-citric acid (ALKA-SELTZER) 325 MG TBEF tablet Take 325 mg by mouth every 6 (six) hours as needed (heart burn).     Marland Kitchen doxycycline (VIBRAMYCIN) 100  MG capsule Take 100 mg by mouth 2 (two) times daily.    Marland Kitchen ibuprofen (ADVIL,MOTRIN) 200 MG tablet Take 400 mg by mouth every 8 (eight) hours as needed for moderate pain.     . pantoprazole (PROTONIX) 20 MG tablet Take 1 tablet (20 mg total) by mouth daily. 30 tablet 0  . promethazine (PHENERGAN) 25 MG suppository Place 1 suppository (25 mg total) rectally every 6 (six) hours as needed for nausea or vomiting. 12 each 0  . sucralfate (CARAFATE) 1 g tablet Take 1 tablet (1 g total) by mouth 4 (four) times daily -  with meals and at bedtime. 30 tablet 0  . potassium chloride SA (KLOR-CON) 20 MEQ tablet Take 1 tablet (20 mEq total) by mouth 2 (two) times daily for 5 days. 10 tablet 0   No current facility-administered medications for this visit.     ALLERGIES: Shrimp [shellfish allergy] and Cabbage  Family History  Problem Relation Age of Onset  . Diabetes  Mother   . Breast cancer Mother        Pt.thinks she may have had br.ca  . Hypertension Mother   . Diabetes Other   . Hypertension Maternal Grandmother     Social History   Socioeconomic History  . Marital status: Single    Spouse name: Not on file  . Number of children: Not on file  . Years of education: Not on file  . Highest education level: Not on file  Occupational History  . Not on file  Tobacco Use  . Smoking status: Former Smoker    Types: Cigarettes  . Smokeless tobacco: Never Used  . Tobacco comment: 11/17/2016 "someday smoker when I did smoke"  Substance and Sexual Activity  . Alcohol use: Not Currently  . Drug use: Not Currently    Comment: Hx of marijuana use  . Sexual activity: Never    Birth control/protection: None, Abstinence  Other Topics Concern  . Not on file  Social History Narrative  . Not on file   Social Determinants of Health   Financial Resource Strain:   . Difficulty of Paying Living Expenses: Not on file  Food Insecurity:   . Worried About Charity fundraiser in the Last Year: Not on file  . Ran Out of Food in the Last Year: Not on file  Transportation Needs:   . Lack of Transportation (Medical): Not on file  . Lack of Transportation (Non-Medical): Not on file  Physical Activity:   . Days of Exercise per Week: Not on file  . Minutes of Exercise per Session: Not on file  Stress:   . Feeling of Stress : Not on file  Social Connections:   . Frequency of Communication with Friends and Family: Not on file  . Frequency of Social Gatherings with Friends and Family: Not on file  . Attends Religious Services: Not on file  . Active Member of Clubs or Organizations: Not on file  . Attends Archivist Meetings: Not on file  . Marital Status: Not on file  Intimate Partner Violence:   . Fear of Current or Ex-Partner: Not on file  . Emotionally Abused: Not on file  . Physically Abused: Not on file  . Sexually Abused: Not on file     Review of Systems  All other systems reviewed and are negative.   PHYSICAL EXAMINATION:    BP 120/88   Pulse 76   Temp 97.6 F (36.4 C) (Temporal)   Resp 20  Ht 5' 0.5" (1.537 m)   Wt 143 lb 12.8 oz (65.2 kg)   LMP 06/18/2019 (Approximate)   BMI 27.62 kg/m     General appearance: alert, cooperative and appears stated age Head: Normocephalic, without obvious abnormality, atraumatic Neck: no adenopathy, supple, symmetrical, trachea midline and thyroid normal to inspection and palpation Lungs: clear to auscultation bilaterally Breasts: normal appearance, no masses or tenderness, No nipple retraction or dimpling, No nipple discharge or bleeding, No axillary or supraclavicular adenopathy Heart: regular rate and rhythm Abdomen: soft, non-tender, no masses,  no organomegaly Extremities: extremities normal, atraumatic, no cyanosis or edema Skin: Skin color, texture, turgor normal. No rashes or lesions Lymph nodes: Cervical, supraclavicular, and axillary nodes normal. No abnormal inguinal nodes palpated Neurologic: Grossly normal  Pelvic: External genitalia:  no lesions              Urethra:  normal appearing urethra with no masses, tenderness or lesions              Bartholins and Skenes: normal                 Vagina: normal appearing vagina with normal color and discharge, no lesions              Cervix: no lesions                Bimanual Exam:  Uterus:   16 week size and tender.  Exam is limited by discomfort.               Adnexa: no mass, fullness, tenderness              Chaperone was present for exam.  ASSESSMENT  Menstrual migraine with aura.  Fibroids. Constipation.  Cannibis use with chronic nausea.   PLAN  Pap and HR HPV taken.  Return for pelvic US.  We discussed her fibroids and tx options that include Depo Provera, Depo Lupron, uterine artery embolization, hysterectomy. She is focused on hysterectomy at this time and information given.  I told her  that FMLA paper work would be completed if she is scheduled for hysterectomy and would include time for her preoperative care, surgical care, and then recovery time. I discussed constipation and Miralax.  We discussed her need to stop using Marijuana.   An After Visit Summary was printed and given to the patient.  __45____ minutes face to face time of which over 50% was spent in counseling.

## 2019-07-03 ENCOUNTER — Telehealth: Payer: Self-pay | Admitting: Obstetrics and Gynecology

## 2019-07-03 LAB — CYTOLOGY - PAP
Adequacy: ABSENT
Comment: NEGATIVE
Diagnosis: NEGATIVE
High risk HPV: NEGATIVE

## 2019-07-03 NOTE — Telephone Encounter (Signed)
[employer] called requesting medical information on patient. Disability forms that were faxed are on Suzy's desk. Phone number [phone].

## 2019-07-04 NOTE — Telephone Encounter (Signed)
Message sent to Dr.Silva for review.

## 2019-07-04 NOTE — Telephone Encounter (Signed)
Spoke with [employer] and patient. Disability forms were requested by the patient for potential surgery. Reviewed with Dr.Silva and patient will need PUS evaluation as scheduled for 07/11/2019 to make a future surgical plan. Patient is aware after this is complete and surgery date is scheduled we can proceed with filling out FMLA forms. Patient verbalizes understanding.  Routing to provider and will close encounter.

## 2019-07-10 ENCOUNTER — Other Ambulatory Visit: Payer: Self-pay

## 2019-07-11 ENCOUNTER — Ambulatory Visit (INDEPENDENT_AMBULATORY_CARE_PROVIDER_SITE_OTHER): Payer: 59

## 2019-07-11 ENCOUNTER — Ambulatory Visit: Payer: 59 | Admitting: Obstetrics and Gynecology

## 2019-07-11 ENCOUNTER — Encounter: Payer: Self-pay | Admitting: Obstetrics and Gynecology

## 2019-07-11 VITALS — BP 138/88 | HR 70 | Temp 96.7°F | Ht 60.5 in | Wt 143.0 lb

## 2019-07-11 DIAGNOSIS — D219 Benign neoplasm of connective and other soft tissue, unspecified: Secondary | ICD-10-CM

## 2019-07-11 DIAGNOSIS — N926 Irregular menstruation, unspecified: Secondary | ICD-10-CM | POA: Diagnosis not present

## 2019-07-11 DIAGNOSIS — R5381 Other malaise: Secondary | ICD-10-CM | POA: Diagnosis not present

## 2019-07-11 MED ORDER — LEVONORGEST-ETH ESTRAD 91-DAY 0.15-0.03 MG PO TABS: 1.0000 | ORAL_TABLET | Freq: Every day | ORAL | 0 refills | Status: DC

## 2019-07-11 NOTE — Progress Notes (Signed)
Encounter reviewed by Dr. Brook Amundson C. Silva.  

## 2019-07-11 NOTE — Patient Instructions (Signed)
Myomectomy    Myomectomy is a surgery in which a non-cancerous fibroid (myoma) is removed from the uterus. Myomas are tumors made up of fibrous tissue. They are often called fibroid tumors. Fibroid tumors can range from the size of a pea to the size of a grapefruit. In a myomectomy, the fibroid tumor is removed without removing the uterus. Because these tumors are rarely cancerous, this surgery is usually done only if the tumor is growing or causing symptoms such as pain, pressure, bleeding, or pain with intercourse.  Tell a health care provider about:  · Any allergies you have.  · All medicines you are taking, including vitamins, herbs, eye drops, creams, and over-the-counter medicines.  · Any problems you or family members have had with anesthetic medicines.  · Any blood disorders you have.  · Any surgeries you have had.  · Any medical conditions you have.  What are the risks?  Generally, this is a safe procedure. However, problems may occur, including:  · Bleeding.  · Infection.  · Allergic reactions to medicines.  · Damage to other structures or organs.  · Blood clots in the legs, chest, or brain.  · Scar tissue on other organs and in the pelvis. This may require another surgery to remove the scar tissue.  What happens before the procedure?  Staying hydrated  Follow instructions from your health care provider about hydration, which may include:  · Up to 2 hours before the procedure - you may continue to drink clear liquids, such as water, clear fruit juice, black coffee, and plain tea.  Eating and drinking restrictions  Follow instructions from your health care provider about eating and drinking, which may include:  · 8 hours before the procedure - stop eating heavy meals or foods such as meat, fried foods, or fatty foods.  · 6 hours before the procedure - stop eating light meals or foods, such as toast or cereal.  · 6 hours before the procedure - stop drinking milk or drinks that contain milk.  · 2 hours before  the procedure - stop drinking clear liquids.  General instructions  · Ask your health care provider about:  ? Changing or stopping your regular medicines. This is especially important if you are taking diabetes medicines or blood thinners.  ? Taking medicines such as aspirin and ibuprofen. These medicines can thin your blood. Do not take these medicines before your procedure if your health care provider instructs you not to.  · Do not drink alcohol the day before the surgery.  · Do not use any products that contain nicotine or tobacco, such as cigarettes and e-cigarettes, for 2 weeks before the procedure. If you need help quitting, ask your health care provider.  · Plan to have someone take you home from the hospital or clinic. Also arrange for someone to help you with activities during your recovery.  What happens during the procedure?  · To reduce your risk of infection:  ? Your health care team will wash or sanitize their hands.  ? Your skin will be washed with soap.  ? Hair may be removed from the surgical area.  · An IV tube will be inserted into one of your veins. Medicines will be able to flow directly into your body through this IV tube.  · You will be given one or more of the following:  ? A medicine to help you relax (sedative).  ? A medicine to make you fall asleep (general anesthetic).  ·   into your bladder to collect urine.  Your surgeon will use one of the following methods to perform the procedure. The method used will depend on the size, shape, location, and number of fibroids. Hysteroscopic myomectomy This method may be used when the fibroid tumor is inside the cavity of the uterus. A long, thin tube with a lens (hysteroscope) will be inserted into the  uterus through the vagina. A saline solution will be put into the uterus. This will expand the uterus and allow the surgeon to see the fibroids. Tools will be passed through the hysteroscope to remove the fibroid tumor in pieces. Laparoscopic myomectomy A few small incisions will be made in the lower abdomen. A thin, lighted tube with a camera (laparoscope) will be inserted through one of the incisions. This will give the surgeon a good view of the area. The fibroid tumor will be removed through the other incisions. The incisions will then be closed with stitches (sutures) or staples. Abdominal myomectomy This method is used when the fibroid tumor cannot be removed with a hysteroscope or laparoscope. The surgery will be done through a larger surgical incision in the abdomen. The fibroid tumor will be removed through this incision. The incision will be closed with sutures or staples. Recovery time will be longer if this method is used. The procedures may vary among health care providers and hospitals. What happens after the procedure?  Your blood pressure, heart rate, breathing rate, and blood oxygen level will be monitored until the medicines you were given have worn off.  The IV access tube and catheter will remain on your body for a period of time.  You may be given medicine for pain or to help you sleep.  You may be given an antibiotic medicine if needed.  Do not drive for 24 hours if you were given a sedative. Summary  Myomectomy is surgery to remove a noncancerous fibroid (myoma) from the uterus.  This surgery is usually done only if the tumor is growing or causing symptoms such as pain, pressure, bleeding, or pain during intercourse.  Follow instructions from your health care provider about eating and drinking before the procedure.  Recovery time from this procedure depends on the method. The abdominal method will require a longer recovery time. This information is not intended to  replace advice given to you by your health care provider. Make sure you discuss any questions you have with your health care provider. Document Revised: 10/05/2018 Document Reviewed: 07/14/2016 Elsevier Patient Education  2020 Else[employer] Inc. Uterine Fibroids  Uterine fibroids (leiomyomas) are noncancerous (benign) tumors that can develop in the uterus. Fibroids may also develop in the fallopian tubes, cervix, or tissues (ligaments) near the uterus. You may have one or many fibroids. Fibroids vary in size, weight, and where they grow in the uterus. Some can become quite large. Most fibroids do not require medical treatment. What are the causes? The cause of this condition is not known. What increases the risk? You are more likely to develop this condition if you:  Are in your 30s or 40s and have not gone through menopause.  Have a family history of this condition.  Are of African-American descent.  Had your first period at an early age (early menarche).  Have not had any children (nulliparity).  Are overweight or obese. What are the signs or symptoms? Many women do not have any symptoms. Symptoms of this condition may include:  Heavy menstrual bleeding.  Bleeding or spotting between periods.    Pain and pressure in the pelvic area, between the hips.  Bladder problems, such as needing to urinate urgently or more often than usual.  Inability to have children (infertility).  Failure to carry pregnancy to term (miscarriage). How is this diagnosed? This condition may be diagnosed based on:  Your symptoms and medical history.  A physical exam.  A pelvic exam that includes feeling for any tumors.  Imaging tests, such as ultrasound or MRI. How is this treated? Treatment for this condition may include:  Seeing your health care provider for follow-up visits to monitor your fibroids for any changes.  Taking NSAIDs such as ibuprofen, naproxen, or aspirin to reduce  pain.  Hormone medicines. These may be taken as a pill, given in an injection, or delivered by a T-shaped device that is inserted into the uterus (intrauterine device, IUD).  Surgery to remove one of the following: ? The fibroids (myomectomy). Your health care provider may recommend this if fibroids affect your fertility and you want to become pregnant. ? The uterus (hysterectomy). ? Blood supply to the fibroids (uterine artery embolization). Follow these instructions at home:  Take over-the-counter and prescription medicines only as told by your health care provider.  Ask your health care provider if you should take iron pills or eat more iron-rich foods, such as dark green, leafy vegetables. Heavy menstrual bleeding can cause low iron levels.  If directed, apply heat to your back or abdomen to reduce pain. Use the heat source that your health care provider recommends, such as a moist heat pack or a heating pad. ? Place a towel between your skin and the heat source. ? Leave the heat on for 20-30 minutes. ? Remove the heat if your skin turns bright red. This is especially important if you are unable to feel pain, heat, or cold. You may have a greater risk of getting burned.  Pay close attention to your menstrual cycle. Tell your health care provider about any changes, such as: ? Increased blood flow that requires you to use more pads or tampons than usual. ? A change in the number of days that your period lasts. ? A change in symptoms that are associated with your period, such as back pain or cramps in your abdomen.  Keep all follow-up visits as told by your health care provider. This is important, especially if your fibroids need to be monitored for any changes. Contact a health care provider if you:  Have pelvic pain, back pain, or cramps in your abdomen that do not get better with medicine or heat.  Develop new bleeding between periods.  Have increased bleeding during or between  periods.  Feel unusually tired or weak.  Feel light-headed. Get help right away if you:  Faint.  Have pelvic pain that suddenly gets worse.  Have severe vaginal bleeding that soaks a tampon or pad in 30 minutes or less. Summary  Uterine fibroids are noncancerous (benign) tumors that can develop in the uterus.  The exact cause of this condition is not known.  Most fibroids do not require medical treatment unless they affect your ability to have children (fertility).  Contact a health care provider if you have pelvic pain, back pain, or cramps in your abdomen that do not get better with medicines.  Make sure you know what symptoms should cause you to get help right away. This information is not intended to replace advice given to you by your health care provider. Make sure you discuss any questions   you have with your health care provider. Document Revised: 05/26/2017 Document Reviewed: 05/09/2017 Elsevier Patient Education  2020 Elsevier Inc.  

## 2019-07-11 NOTE — Progress Notes (Signed)
GYNECOLOGY  VISIT   HPI: 34 y.o.   Single  African American  female   [place] with Patient's last menstrual period was 07/04/2019 (exact date).   here for pelvic ultrasound.    Patient has fibroids and prolonged heavy bleeding and pain with her menstruation.  Her Hgb 10.9 on 06/21/19 and then 12.3 on 06/24/19.  She is feeling better today as she is off her menses.  Cycle started 07/04/19, and still spotting a little today.   She leaks with sneeze and cough.  Urinary frequency.   Never took birth control pills.  Denies HTN, migraine with aura (does have migraine with menstruation but no aura), liver or breast disease, personal or family history of thromboembolic events.  She may want to consider freezing her eggs for the future.  GYNECOLOGIC HISTORY: Patient's last menstrual period was 07/04/2019 (exact date). Contraception: Abstinence Menopausal hormone therapy:  n/a Last mammogram:  n/a Last pap smear: 07-02-19 Neg:Neg HR HPV        OB History    Gravida  0   Para  0   Term  0   Preterm  0   AB  0   Living  0     SAB  0   TAB  0   Ectopic  0   Multiple  0   Live Births  0              Patient Active Problem List   Diagnosis Date Noted  . Vomiting 11/17/2016  . Hypokalemia 11/17/2016  . Cannabis hyperemesis syndrome concurrent with and due to cannabis abuse ([place]) 11/09/2016    Past Medical History:  Diagnosis Date  . Asthma   . 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 Fibroid   . Fibroids   . Migraines    menstrual migraine wiht aura    Past Surgical History:  Procedure Laterality Date  . broken hand Left    pins placed in left hand  . NO PAST SURGERIES      Current Outpatient Medications  Medication Sig Dispense Refill  . aspirin-sod bicarb-citric acid (ALKA-SELTZER) 325 MG TBEF tablet Take 325 mg by mouth every 6 (six) hours as needed (heart burn).     Marland Kitchen doxycycline (VIBRAMYCIN) 100 MG  capsule Take 100 mg by mouth 2 (two) times daily.    Marland Kitchen ibuprofen (ADVIL,MOTRIN) 200 MG tablet Take 400 mg by mouth every 8 (eight) hours as needed for moderate pain.     . pantoprazole (PROTONIX) 20 MG tablet Take 1 tablet (20 mg total) by mouth daily. 30 tablet 0  . promethazine (PHENERGAN) 25 MG suppository Place 1 suppository (25 mg total) rectally every 6 (six) hours as needed for nausea or vomiting. 12 each 0  . sucralfate (CARAFATE) 1 g tablet Take 1 tablet (1 g total) by mouth 4 (four) times daily -  with meals and at bedtime. 30 tablet 0  . potassium chloride SA (KLOR-CON) 20 MEQ tablet Take 1 tablet (20 mEq total) by mouth 2 (two) times daily for 5 days. 10 tablet 0   No current facility-administered medications for this visit.     ALLERGIES: Shrimp [shellfish allergy] and Cabbage  Family History  Problem Relation Age of Onset  . Diabetes Mother   . Breast cancer Mother        Pt.thinks she may have had br.ca  . Hypertension Mother   . Diabetes Other   . Hypertension Maternal  Grandmother     Social History   Socioeconomic History  . Marital status: Single    Spouse name: Not on file  . Number of children: Not on file  . Years of education: Not on file  . Highest education level: Not on file  Occupational History  . Not on file  Tobacco Use  . Smoking status: Former Smoker    Types: Cigarettes  . Smokeless tobacco: Never Used  . Tobacco comment: 11/17/2016 "someday smoker when I did smoke"  Substance and Sexual Activity  . Alcohol use: Not Currently  . Drug use: Not Currently    Comment: Hx of marijuana use  . Sexual activity: Never    Birth control/protection: None, Abstinence  Other Topics Concern  . Not on file  Social History Narrative  . Not on file   Social Determinants of Health   Financial Resource Strain:   . Difficulty of Paying Living Expenses: Not on file  Food Insecurity:   . Worried About Charity fundraiser in the Last Year: Not on file  .  Ran Out of Food in the Last Year: Not on file  Transportation Needs:   . Lack of Transportation (Medical): Not on file  . Lack of Transportation (Non-Medical): Not on file  Physical Activity:   . Days of Exercise per Week: Not on file  . Minutes of Exercise per Session: Not on file  Stress:   . Feeling of Stress : Not on file  Social Connections:   . Frequency of Communication with Friends and Family: Not on file  . Frequency of Social Gatherings with Friends and Family: Not on file  . Attends Religious Services: Not on file  . Active Member of Clubs or Organizations: Not on file  . Attends Archivist Meetings: Not on file  . Marital Status: Not on file  Intimate Partner Violence:   . Fear of Current or Ex-Partner: Not on file  . Emotionally Abused: Not on file  . Physically Abused: Not on file  . Sexually Abused: Not on file    Review of Systems  All other systems reviewed and are negative.   PHYSICAL EXAMINATION:    BP 138/88   Pulse 70   Temp (!) 96.7 F (35.9 C) (Temporal)   Ht 5' 0.5" (1.537 m)   Wt 143 lb (64.9 kg)   LMP 07/04/2019 (Exact Date)   BMI 27.47 kg/m     General appearance: alert, cooperative and appears stated age   Pelvic US  Multiple fibroids:  5 fibroids, largest 54 mm and potential submucosal and 47 mm.   EMS 13.52 mm. Ovaries normal.  No free fluid.  ASSESSMENT  Symptomatic fibroids.  Menstrual migraines.   PLAN  Comprehensive discussion of uterine fibroids, their benign nature, signs and symptoms, medical options, uterine artery embolization, myomectomy, and hysterectomy. Referral to Dr. Kerin Perna for potential laparoscopic myomectomy and fertility care.  TSH. Rx for Seasonale. Fu in 3 months for a recheck.  Will see if we need to add estrogen to her menstrual week.   An After Visit Summary was printed and given to the patient.  __25____ minutes face to face time of which over 50% was spent in counseling.

## 2019-07-16 ENCOUNTER — Telehealth: Payer: Self-pay | Admitting: Obstetrics and Gynecology

## 2019-07-16 NOTE — Telephone Encounter (Signed)
Please contact patient in follow up to her ultrasound visit with me.   She was going to have her TSH checked, and she may have left without having her blood drawn at the visit.  She can return for a lab visit to complete this as part of her evaluation for her abnormal bleeding.   She does have fibroids and will be seeing Dr. Kerin Perna for potential myomectomy.

## 2019-07-17 ENCOUNTER — Telehealth: Payer: Self-pay | Admitting: Obstetrics and Gynecology

## 2019-07-17 ENCOUNTER — Other Ambulatory Visit (INDEPENDENT_AMBULATORY_CARE_PROVIDER_SITE_OTHER): Payer: 59

## 2019-07-17 ENCOUNTER — Other Ambulatory Visit: Payer: Self-pay

## 2019-07-17 DIAGNOSIS — R5381 Other malaise: Secondary | ICD-10-CM

## 2019-07-17 NOTE — Telephone Encounter (Signed)
Dan with [facility] calling for birth control prescription for patient. [phone]

## 2019-07-17 NOTE — Telephone Encounter (Signed)
I would suggest the [facility] in [place].

## 2019-07-17 NOTE — Telephone Encounter (Signed)
Spoke with patient, advised per Dr. Quincy Simmonds.   Lab appt scheduled for today, 07/17/19 at 1:15pm.   Patient is requesting and alternative to Dr. Kerin Perna, not covered under her plan. Advised patient to check with her plan to review providers in network and return call to advise where she would like to be referred. I will review with Dr. Quincy Simmonds and return call if any additional recommendations. Patient agreeable.   Routing to Dr. Quincy Simmonds to review.

## 2019-07-18 LAB — TSH: TSH: 1.41 u[IU]/mL (ref 0.450–4.500)

## 2019-07-18 NOTE — Telephone Encounter (Signed)
See telephone encounter dated 07/17/19.   Encounter closed.

## 2019-07-18 NOTE — Telephone Encounter (Signed)
Carrie Guerra   GW  07/17/19 1:28 PM Note   Dan with [facility] calling for birth control prescription for patient. [phone]

## 2019-07-18 NOTE — Telephone Encounter (Signed)
Spoke with patient.   1. Advised as seen below per Dr. Quincy Simmonds. Patient will check with her insurance plan and return call if referral desired to [facility] in [place].   2. Advised of 07/17/19 TSH 1.410, advised WNL. Dr. Quincy Simmonds will need to review and our office will f/u if any additional recommendations.   3. Patient states she was unsure where OCP rx was sent, advised Season[facility] Rx was sent to [facility]. Patient will f/u with pharmacy for filling or will transfer to [facility].   Questions answered. Patient is aware to call if any additional assistance needed.   Routing to provider for final review. Patient is agreeable to disposition. Will close encounter.

## 2019-08-29 ENCOUNTER — Telehealth: Payer: Self-pay | Admitting: Obstetrics and Gynecology

## 2019-08-29 ENCOUNTER — Other Ambulatory Visit: Payer: Self-pay

## 2019-08-29 ENCOUNTER — Emergency Department (HOSPITAL_COMMUNITY)
Admission: EM | Admit: 2019-08-29 | Discharge: 2019-08-29 | Discharge status: Against Medical Advice | Payer: 59 | Attending: Emergency Medicine | Admitting: Emergency Medicine

## 2019-08-29 ENCOUNTER — Encounter (HOSPITAL_COMMUNITY): Payer: Self-pay | Admitting: Emergency Medicine

## 2019-08-29 DIAGNOSIS — F12188 Cannabis abuse with other cannabis-induced disorder: Secondary | ICD-10-CM | POA: Insufficient documentation

## 2019-08-29 DIAGNOSIS — R112 Nausea with vomiting, unspecified: Secondary | ICD-10-CM | POA: Diagnosis not present

## 2019-08-29 DIAGNOSIS — R102 Pelvic and perineal pain: Secondary | ICD-10-CM | POA: Diagnosis present

## 2019-08-29 DIAGNOSIS — D259 Leiomyoma of uterus, unspecified: Secondary | ICD-10-CM | POA: Diagnosis not present

## 2019-08-29 DIAGNOSIS — R9431 Abnormal electrocardiogram [ECG] [EKG]: Secondary | ICD-10-CM | POA: Insufficient documentation

## 2019-08-29 LAB — COMPREHENSIVE METABOLIC PANEL
ALT: 15 U/L (ref 0–44)
AST: 23 U/L (ref 15–41)
Albumin: 4.6 g/dL (ref 3.5–5.0)
Alkaline Phosphatase: 49 U/L (ref 38–126)
Anion gap: 12 (ref 5–15)
BUN: 5 mg/dL — ABNORMAL LOW (ref 6–20)
CO2: 19 mmol/L — ABNORMAL LOW (ref 22–32)
Calcium: 9.2 mg/dL (ref 8.9–10.3)
Chloride: 110 mmol/L (ref 98–111)
Creatinine, Ser: 0.77 mg/dL (ref 0.44–1.00)
GFR calc Af Amer: >60 mL/min (ref >60)
GFR calc non Af Amer: >60 mL/min (ref >60)
Glucose, Bld: 141 mg/dL — ABNORMAL HIGH (ref 70–99)
Potassium: 3.4 mmol/L — ABNORMAL LOW (ref 3.5–5.1)
Sodium: 141 mmol/L (ref 135–145)
Total Bilirubin: 0.7 mg/dL (ref 0.3–1.2)
Total Protein: 8.4 g/dL — ABNORMAL HIGH (ref 6.5–8.1)

## 2019-08-29 LAB — I-STAT BETA HCG BLOOD, ED (MC, WL, AP ONLY): I-stat hCG, quantitative: <5 m[IU]/mL (ref <5)

## 2019-08-29 LAB — CBC WITH DIFFERENTIAL/PLATELET
Abs Immature Granulocytes: 0.02 10*3/uL (ref 0.00–0.07)
Basophils Absolute: 0 10*3/uL (ref 0.0–0.1)
Basophils Relative: 1 %
Eosinophils Absolute: 0 10*3/uL (ref 0.0–0.5)
Eosinophils Relative: 1 %
HCT: 32.7 % — ABNORMAL LOW (ref 36.0–46.0)
Hemoglobin: 10.2 g/dL — ABNORMAL LOW (ref 12.0–15.0)
Immature Granulocytes: 0 %
Lymphocytes Relative: 18 %
Lymphs Abs: 1.1 10*3/uL (ref 0.7–4.0)
MCH: 27.9 pg (ref 26.0–34.0)
MCHC: 31.2 g/dL (ref 30.0–36.0)
MCV: 89.3 fL (ref 80.0–100.0)
Monocytes Absolute: 0.5 10*3/uL (ref 0.1–1.0)
Monocytes Relative: 8 %
Neutro Abs: 4.6 10*3/uL (ref 1.7–7.7)
Neutrophils Relative %: 72 %
Platelets: 342 10*3/uL (ref 150–400)
RBC: 3.66 MIL/uL — ABNORMAL LOW (ref 3.87–5.11)
RDW: 13.4 % (ref 11.5–15.5)
WBC: 6.3 10*3/uL (ref 4.0–10.5)
nRBC: 0 % (ref 0.0–0.2)

## 2019-08-29 LAB — LIPASE, BLOOD: Lipase: 26 U/L (ref 11–51)

## 2019-08-29 MED ORDER — ONDANSETRON HCL 4 MG/2ML IJ SOLN: 4.0000 mg | Freq: Once | INTRAMUSCULAR | Status: DC

## 2019-08-29 MED ORDER — KETOROLAC TROMETHAMINE 15 MG/ML IJ SOLN
15.0000 mg | Freq: Once | INTRAMUSCULAR | Status: AC
  Administered 2019-08-29: 15 mg via INTRAVENOUS
  Filled 2019-08-29: qty 1

## 2019-08-29 MED ORDER — DROPERIDOL 2.5 MG/ML IJ SOLN: 1.2500 mg | Freq: Once | INTRAMUSCULAR | Status: DC

## 2019-08-29 MED ORDER — SODIUM CHLORIDE 0.9 % IV BOLUS: 1000.0000 mL | Freq: Once | INTRAVENOUS | Status: DC

## 2019-08-29 MED ORDER — MORPHINE SULFATE (PF) 4 MG/ML IV SOLN
4.0000 mg | Freq: Once | INTRAVENOUS | Status: DC
  Filled 2019-08-29: qty 1

## 2019-08-29 MED ORDER — HALOPERIDOL LACTATE 5 MG/ML IJ SOLN
5.0000 mg | Freq: Once | INTRAMUSCULAR | Status: AC
  Administered 2019-08-29: 5 mg via INTRAVENOUS

## 2019-08-29 MED ORDER — HALOPERIDOL LACTATE 5 MG/ML IJ SOLN
INTRAMUSCULAR | Status: AC
  Filled 2019-08-29: qty 1

## 2019-08-29 NOTE — ED Provider Notes (Signed)
[facility] DEPT Provider Note   CSN: [id number] Arrival date & time: 08/29/19  1102     History Chief Complaint  Patient presents with  . Pelvic Pain    Carrie Guerra is a 34 y.o. female history of fibroids, cannabis hyperemesis syndrome, asthma, migraines.  Patient presents today for severe menstrual cramping.  She reports lower pelvic pain beginning this morning around 5 AM, severe cramping sensation constant nonradiating no clear aggravating or alleviating factors.  Associated with multiple episodes of nausea with nonbloody/nonbilious emesis.  She reports this is consistent with her normal menstrual cycle and occurs on a nearly monthly basis.  She denies any abnormal features of her symptoms today.  She denies any fever/chills, headache, chest pain/shortness of breath, hematuria/dysuria, vaginal discharge, concern for STI, fall/injury or any additional concerns today.  HPI     Past Medical History:  Diagnosis Date  . Asthma   . 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 Fibroid   . Fibroids   . Migraines    menstrual migraine wiht aura    Patient Active Problem List   Diagnosis Date Noted  . Vomiting 11/17/2016  . Hypokalemia 11/17/2016  . 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 broken hand Left    pins placed in left hand  . NO PAST SURGERIES       OB History    Gravida  0   Para  0   Term  0   Preterm  0   AB  0   Living  0     SAB  0   TAB  0   Ectopic  0   Multiple  0   Live Births  0           Family History  Problem Relation Age of Onset  . Diabetes Mother   . Breast cancer Mother        Pt.thinks she may have had br.ca  . Hypertension Mother   . Diabetes Other   . Hypertension Maternal Grandmother     Social History   Tobacco Use  .  Smoking status: Former Smoker    Types: Cigarettes  . Smokeless tobacco: Never Used  . Tobacco comment: 11/17/2016 "someday smoker when I did smoke"  Substance Use Topics  . Alcohol use: Not Currently  . Drug use: Not Currently    Comment: Hx of marijuana use    Home Medications Prior to Admission medications   Medication Sig Start Date End Date Taking? Authorizing Provider  Acetaminophen-Caff-Pyrilamine (MIDOL COMPLETE PO) Take 2 tablets by mouth as needed (cramping/pain).   Yes [provider]  ibuprofen (ADVIL,MOTRIN) 200 MG tablet Take 400 mg by mouth every 8 (eight) hours as needed for moderate pain.    Yes [provider]  levonorgestrel-ethinyl estradiol (SEASONALE) 0.15-0.03 MG tablet Take 1 tablet by mouth daily. Patient not taking: Reported on 08/29/2019 07/11/19   Nunzio Cobbs, MD  pantoprazole (PROTONIX) 20 MG tablet Take 1 tablet (20 mg total) by mouth daily. Patient not taking: Reported on 08/29/2019 06/22/19   Muthersbaugh, [name], PA-C  potassium chloride SA (KLOR-CON) 20 MEQ tablet Take 1 tablet (20 mEq total) by mouth 2 (two) times daily for 5 days. Patient not taking: Reported on 08/29/2019 06/26/19 07/01/19  McDonald, Mia A, PA-C  promethazine (PHENERGAN) 25 MG  suppository Place 1 suppository (25 mg total) rectally every 6 (six) hours as needed for nausea or vomiting. Patient not taking: Reported on 08/29/2019 06/26/19   McDonald, Maree Erie A, PA-C  sucralfate (CARAFATE) 1 g tablet Take 1 tablet (1 g total) by mouth 4 (four) times daily -  with meals and at bedtime. Patient not taking: Reported on 08/29/2019 06/22/19   Muthersbaugh, [name], PA-C  prochlorperazine (COMPAZINE) 25 MG suppository Place 1 suppository (25 mg total) rectally every 12 (twelve) hours as needed for nausea or vomiting. Patient not taking: Reported on 04/04/2017 11/08/16 06/22/19  Lorin Glass, PA-C    Allergies    Shrimp [shellfish allergy] and Cabbage  Review of Systems     Review of Systems Ten systems are reviewed and are negative for acute change except as noted in the HPI  Physical Exam Updated Vital Signs BP (!) 155/100   Pulse 75   Temp 97.7 F (36.5 C) (Oral)   Resp (!) 23   LMP 08/29/2019   SpO2 100%   Physical Exam Constitutional:      General: She is in acute distress.     Appearance: Normal appearance. She is well-developed. She is not ill-appearing or diaphoretic.  HENT:     Head: Normocephalic and atraumatic.     Right Ear: External ear normal.     Left Ear: External ear normal.     Nose: Nose normal.  Eyes:     General: Vision grossly intact. Gaze aligned appropriately.     Pupils: Pupils are equal, round, and reactive to light.  Neck:     Trachea: Trachea and phonation normal. No tracheal deviation.  Pulmonary:     Effort: Pulmonary effort is normal. No respiratory distress.  Abdominal:     General: There is no distension.     Palpations: Abdomen is soft.     Tenderness: There is abdominal tenderness in the right lower quadrant, suprapubic area and left lower quadrant. There is no guarding or rebound.  Genitourinary:    Comments: Deferred by patient x2 Musculoskeletal:        General: Normal range of motion.     Cervical back: Normal range of motion.  Skin:    General: Skin is warm and dry.  Neurological:     Mental Status: She is alert.     GCS: GCS eye subscore is 4. GCS verbal subscore is 5. GCS motor subscore is 6.     Comments: Speech is clear and goal oriented, follows commands Major Cranial nerves without deficit, no facial droop Moves extremities without ataxia, coordination intact  Psychiatric:        Behavior: Behavior normal.     ED Results / Procedures / Treatments   Labs (all labs ordered are listed, but only abnormal results are displayed) Labs Reviewed  CBC WITH DIFFERENTIAL/PLATELET - Abnormal; Notable for the following components:      Result Value   RBC 3.66 (*)    Hemoglobin 10.2 (*)    HCT  32.7 (*)    All other components within normal limits  COMPREHENSIVE METABOLIC PANEL - Abnormal; Notable for the following components:   Potassium 3.4 (*)    CO2 19 (*)    Glucose, Bld 141 (*)    BUN 5 (*)    Total Protein 8.4 (*)    All other components within normal limits  LIPASE, BLOOD  URINALYSIS, ROUTINE W REFLEX MICROSCOPIC  I-STAT BETA HCG BLOOD, ED ([facility], [facility], [facility] ONLY)  EKG EKG Interpretation  Date/Time:  Thursday August 29 2019 11:56:31 EST Ventricular Rate:  77 PR Interval:    QRS Duration: 75 QT Interval:  417 QTC Calculation: 472 R Axis:   81 Text Interpretation: Sinus rhythm Probable anteroseptal infarct, old Artifact Confirmed by Quintella Reichert [phone]) on 08/29/2019 12:20:17 PM   Radiology No results found.  Procedures Procedures (including critical care time)  Medications Ordered in ED Medications  morphine 4 MG/ML injection 4 mg (has no administration in time range)  sodium chloride 0.9 % bolus 1,000 mL (has no administration in time range)  haloperidol lactate (HALDOL) 5 MG/ML injection (has no administration in time range)  ketorolac (TORADOL) 15 MG/ML injection 15 mg (15 mg Intravenous Given 08/29/19 1218)  haloperidol lactate (HALDOL) injection 5 mg (5 mg Intravenous Given 08/29/19 1217)    ED Course  I have reviewed the triage vital signs and the nursing notes.  Pertinent labs & imaging results that were available during my care of the patient were reviewed by me and considered in my medical decision making (see chart for details).    MDM Rules/Calculators/A&P                     34 year old female arrives today for lower abdominal/pelvic pain beginning this morning with vaginal bleeding which she reports is consistent with her menstrual cycle.  She denies any abnormal features of this menstrual cramping/bleeding today reports this is her normal monthly presentation.  She is 6 crouch in the corner of the room and vomiting into the emesis bag, she  refuses to lay in bed.  Abdominal pain labs, morphine and Zofran were ordered.  Chart review shows patient with multiple ED visits for cannabis hyperemesis syndrome.  Zofran was canceled and EKG was ordered for evaluation of QT, plan to give Haldol or droperidol for suspected hyperemesis. Discussed case with Dr. Ralene Bathe.  Pain medication ordered. - Oral pain resolved, CBC without extensive suggest infection, hemoglobin 10.2 which appears similar to prior.  Lipase within normal limits no evidence of pancreatitis.  Pregnancy test negative.  CMP without emergent electrolyte abnormality, kidney injury or elevation of LFTs.  Urinalysis has not been collected yet. - EKG showed QT of 472.  Patient was given 5 mg of Haldol.,  4 mg morphine and 15 mg IV Toradol.  I reassessed the patient she is much more calm, lying in bed no acute distress.  She has lower abdominal tenderness to palpation.  I offered patient pelvic exam for evaluation of her pelvic pain, she refused this multiple times.  I then offered patient ultrasound examination for evaluation of etiology of her pain.  She also refused ultrasound.  I had a long conversation with patient about possible etiologies of her pain including torsion and PID.  She is aware that both of these would need evaluation by pelvic exam or by pelvic ultrasound and she refused.  She was agreeable however to CT scan for evaluation of her symptoms today.  Feel this would be beneficial over no evaluation and was ordered.  Shortly after I order the CT scan I was informed by nurse that patient is requesting to leave.  I then went back to patient's room, she is resting comfortably no acute distress, fully alert and oriented and has the mental capacity make her own medical decisions.  She reports that her ride is outside and she needs to go to work.  I discussed the risks of leaving [facility] today including  but not limited to pain, infection, disability and death.  Patient stated  full understanding of potential risks of leaving [facility] and still wishes to do so.  She is aware that she may return to emergency department at any time for reevaluation.  Patient then dressed herself and left the emergency department [facility] without assistance or difficulty.    Note: Portions of this report may have been transcribed using voice recognition software. Every effort was made to ensure accuracy; however, inadvertent computerized transcription errors may still be present. Final Clinical Impression(s) / ED Diagnoses Final diagnoses:  Pelvic pain in female    Rx / DC Orders ED Discharge Orders    None       Gari Crown 08/29/19 1414    Quintella Reichert, MD 08/30/19 1104

## 2019-08-29 NOTE — Telephone Encounter (Signed)
Patient is calling in tears due to pain from fibroids. Patient stated that she had to call out of work today due to the amount of pain she is in. Patient is wanting to know if she can be seen today or if she should go to the ER.

## 2019-08-29 NOTE — Telephone Encounter (Signed)
Call reviewed with Dr. Quincy Simmonds. Call returned to patient, advised Dr. Quincy Simmonds does recommended ER for further evaluation. Patient will return call when she has been evaluated and feeling better to further discuss referral to [facility] in [place] for tx of fibroids.   Per review of 07/16/19 telephone encounter, patient was going to check with insurance plan and advise on referral to [facility] in [place].   Routing to provider for final review. Patient is agreeable to disposition. Will close encounter.

## 2019-08-29 NOTE — ED Triage Notes (Signed)
Patient c/o pelvic pain with hx fibroids. States pain started today. First day of menstrual cycle. Vomiting in triage.

## 2019-08-29 NOTE — Telephone Encounter (Signed)
Spoke with patient. Patient is crying, currently in hot shower. Reports mid abdominal pain and LLQ pain 8/10. Symptoms started today with menses. Nausea and vomiting, unable to keep down motrin or fluids. Reports normal BMs. Denies fever/chills.   Per review of Epic, patient was seen in office on 07/11/19, PUS confirmed fibroids.   Advised patient since she is unable to keep down fluids, go directly to ER for further evaluation. I will review with Dr. Quincy Simmonds and return call. Patient agreeable.

## 2019-08-29 NOTE — Discharge Instructions (Addendum)
You are leaving [facility] at this time.  I highly recommend that you stay in the ED now and complete your work-up.  [place] has many risks including but not limited to worsening of pain, infection, disability and potentially death.  You may return to the emergency department at any time for further evaluation.  Again we strongly recommend that you stay in the emergency department for completion of your work-up.

## 2019-08-30 ENCOUNTER — Emergency Department (HOSPITAL_COMMUNITY)
Admission: EM | Admit: 2019-08-30 | Discharge: 2019-08-31 | Disposition: A | Discharge status: Home / Self Care | Payer: 59 | Attending: Emergency Medicine | Admitting: Emergency Medicine

## 2019-08-30 ENCOUNTER — Other Ambulatory Visit: Payer: Self-pay

## 2019-08-30 ENCOUNTER — Encounter (HOSPITAL_COMMUNITY): Payer: Self-pay

## 2019-08-30 DIAGNOSIS — R1116 Cannabis hyperemesis syndrome: Secondary | ICD-10-CM

## 2019-08-30 DIAGNOSIS — N939 Abnormal uterine and vaginal bleeding, unspecified: Secondary | ICD-10-CM | POA: Diagnosis not present

## 2019-08-30 DIAGNOSIS — J45909 Unspecified asthma, uncomplicated: Secondary | ICD-10-CM | POA: Diagnosis not present

## 2019-08-30 DIAGNOSIS — F1290 Cannabis use, unspecified, uncomplicated: Secondary | ICD-10-CM | POA: Diagnosis not present

## 2019-08-30 DIAGNOSIS — R109 Unspecified abdominal pain: Secondary | ICD-10-CM | POA: Diagnosis present

## 2019-08-30 DIAGNOSIS — R197 Diarrhea, unspecified: Secondary | ICD-10-CM | POA: Insufficient documentation

## 2019-08-30 DIAGNOSIS — Z87891 Personal history of nicotine dependence: Secondary | ICD-10-CM | POA: Diagnosis not present

## 2019-08-30 DIAGNOSIS — R112 Nausea with vomiting, unspecified: Secondary | ICD-10-CM | POA: Insufficient documentation

## 2019-08-30 MED ORDER — SODIUM CHLORIDE 0.9 % IV BOLUS
1000.0000 mL | Freq: Once | INTRAVENOUS | Status: AC
  Administered 2019-08-30: 1000 mL via INTRAVENOUS

## 2019-08-30 MED ORDER — DROPERIDOL 2.5 MG/ML IJ SOLN
2.5000 mg | Freq: Once | INTRAMUSCULAR | Status: AC
  Administered 2019-08-31: 2.5 mg via INTRAVENOUS
  Filled 2019-08-30: qty 2

## 2019-08-30 MED ORDER — ONDANSETRON HCL 4 MG/2ML IJ SOLN
4.0000 mg | Freq: Once | INTRAMUSCULAR | Status: AC
  Administered 2019-08-31: 4 mg via INTRAVENOUS
  Filled 2019-08-30: qty 2

## 2019-08-30 NOTE — ED Triage Notes (Signed)
Pt brought in by EMS for abd pain and vomiting. Pt states she has 7 uterine fibroids in which this happens every month.

## 2019-08-30 NOTE — ED Provider Notes (Signed)
[facility] DEPT Provider Note   CSN: [id number] Arrival date & time: 08/30/19  2247     History Chief Complaint  Patient presents with  . Abdominal Pain  . Emesis    Carrie Guerra is a 34 y.o. female.  Patient here with severe left-sided abdominal pain with nausea and vomiting.  She states she gets this on a monthly basis due to her fibroids.  She reports this pain is the same pain that occurs every month.  She was seen for the same pain yesterday left AMA before imaging was performed.  She reports no relief of her symptoms.  She has sharp crampy abdominal pain to her mid abdomen and left side of her abdomen that feels like her usual pain.  She has vomited countless times today cannot keep anything down.  States she has not used any marijuana in 2 weeks but has been diagnosed with hyperemesis syndrome in the past.  States she has heavy menstrual bleeding similar to her usual periods.  No diarrhea, fever, pain with urination or blood in the urine.  No chest pain or shortness of breath.  No previous abdominal surgeries.  The history is provided by the patient.  Abdominal Pain Associated symptoms: diarrhea, nausea, vaginal bleeding and vomiting   Associated symptoms: no chest pain, no cough, no dysuria, no fever, no hematuria and no shortness of breath   Emesis Associated symptoms: abdominal pain, arthralgias, diarrhea and myalgias   Associated symptoms: no cough, no fever and no headaches        Past Medical History:  Diagnosis Date  . Asthma   . Cannabis hyperemesis syndrome concurrent with and due to cannabis abuse ([place]) 11/09/2016  . Fibroid   . Fibroids   . Migraines    menstrual migraine wiht aura    Patient Active Problem List   Diagnosis Date Noted  . Vomiting 11/17/2016  . Hypokalemia 11/17/2016  . Cannabis hyperemesis syndrome concurrent with and due to cannabis abuse ([place]) 11/09/2016    Past Surgical History:  Procedure Laterality  Date  . broken hand Left    pins placed in left hand  . NO PAST SURGERIES       OB History    Gravida  0   Para  0   Term  0   Preterm  0   AB  0   Living  0     SAB  0   TAB  0   Ectopic  0   Multiple  0   Live Births  0           Family History  Problem Relation Age of Onset  . Diabetes Mother   . Breast cancer Mother        Pt.thinks she may have had br.ca  . Hypertension Mother   . Diabetes Other   . Hypertension Maternal Grandmother     Social History   Tobacco Use  . Smoking status: Former Smoker    Types: Cigarettes  . Smokeless tobacco: Never Used  . Tobacco comment: 11/17/2016 "someday smoker when I did smoke"  Substance Use Topics  . Alcohol use: Yes    Comment: occassionly  . Drug use: Yes    Types: Marijuana    Comment: Hx of marijuana use    Home Medications Prior to Admission medications   Medication Sig Start Date End Date Taking? Authorizing Provider  Acetaminophen-Caff-Pyrilamine (MIDOL COMPLETE PO) Take 2 tablets by mouth as needed (cramping/pain).  [provider]  ibuprofen (ADVIL,MOTRIN) 200 MG tablet Take 400 mg by mouth every 8 (eight) hours as needed for moderate pain.     [provider]  levonorgestrel-ethinyl estradiol (SEASONALE) 0.15-0.03 MG tablet Take 1 tablet by mouth daily. Patient not taking: Reported on 08/29/2019 07/11/19   Nunzio Cobbs, MD  pantoprazole (PROTONIX) 20 MG tablet Take 1 tablet (20 mg total) by mouth daily. Patient not taking: Reported on 08/29/2019 06/22/19   Muthersbaugh, [name], PA-C  potassium chloride SA (KLOR-CON) 20 MEQ tablet Take 1 tablet (20 mEq total) by mouth 2 (two) times daily for 5 days. Patient not taking: Reported on 08/29/2019 06/26/19 07/01/19  McDonald, Mia A, PA-C  promethazine (PHENERGAN) 25 MG suppository Place 1 suppository (25 mg total) rectally every 6 (six) hours as needed for nausea or vomiting. Patient not taking: Reported on 08/29/2019 06/26/19    McDonald, Maree Erie A, PA-C  sucralfate (CARAFATE) 1 g tablet Take 1 tablet (1 g total) by mouth 4 (four) times daily -  with meals and at bedtime. Patient not taking: Reported on 08/29/2019 06/22/19   Muthersbaugh, [name], PA-C  prochlorperazine (COMPAZINE) 25 MG suppository Place 1 suppository (25 mg total) rectally every 12 (twelve) hours as needed for nausea or vomiting. Patient not taking: Reported on 04/04/2017 11/08/16 06/22/19  Lorin Glass, PA-C    Allergies    Shrimp [shellfish allergy] and Cabbage  Review of Systems   Review of Systems  Constitutional: Positive for activity change and appetite change. Negative for fever.  HENT: Negative for congestion and rhinorrhea.   Respiratory: Negative for cough, chest tightness and shortness of breath.   Cardiovascular: Negative for chest pain.  Gastrointestinal: Positive for abdominal pain, diarrhea, nausea and vomiting.  Genitourinary: Positive for flank pain and vaginal bleeding. Negative for dysuria and hematuria.  Musculoskeletal: Positive for arthralgias and myalgias.  Skin: Negative for rash.  Neurological: Negative for dizziness, weakness and headaches.   all other systems are negative except as noted in the HPI and PMH.    Physical Exam Updated Vital Signs BP (!) 142/96   Pulse 61   Temp 97.9 F (36.6 C) (Oral)   Resp 20   Ht 5' (1.524 m)   LMP 08/29/2019   SpO2 99%   BMI 27.93 kg/m   Physical Exam Vitals and nursing note reviewed.  Constitutional:      General: She is in acute distress.     Appearance: She is well-developed.     Comments: uncomfortable  HENT:     Head: Normocephalic and atraumatic.     Mouth/Throat:     Pharynx: No oropharyngeal exudate.  Eyes:     Conjunctiva/sclera: Conjunctivae normal.     Pupils: Pupils are equal, round, and reactive to light.  Neck:     Comments: No meningismus. Cardiovascular:     Rate and Rhythm: Normal rate and regular rhythm.     Heart sounds: Normal heart  sounds. No murmur.  Pulmonary:     Effort: Pulmonary effort is normal. No respiratory distress.     Breath sounds: Normal breath sounds.  Abdominal:     Palpations: Abdomen is soft.     Tenderness: There is abdominal tenderness. There is guarding. There is no rebound.     Comments: TTP LUQ and LLQ and suprapubic. Voluntary guarding  Genitourinary:    Comments: Patient refuses pelvic exam.  States her gynecologist did it last month. Musculoskeletal:        General: No  tenderness. Normal range of motion.     Cervical back: Normal range of motion and neck supple.  Skin:    General: Skin is warm.  Neurological:     Mental Status: She is alert and oriented to person, place, and time.     Cranial Nerves: No cranial nerve deficit.     Motor: No abnormal muscle tone.     Coordination: Coordination normal.     Comments:  5/5 strength throughout. CN 2-12 intact.Equal grip strength.   Psychiatric:        Behavior: Behavior normal.     ED Results / Procedures / Treatments   Labs (all labs ordered are listed, but only abnormal results are displayed) Labs Reviewed  CBC WITH DIFFERENTIAL/PLATELET - Abnormal; Notable for the following components:      Result Value   RBC 3.47 (*)    Hemoglobin 9.8 (*)    HCT 30.8 (*)    All other components within normal limits  COMPREHENSIVE METABOLIC PANEL - Abnormal; Notable for the following components:   Potassium 3.1 (*)    Glucose, Bld 110 (*)    Total Protein 8.3 (*)    All other components within normal limits  URINALYSIS, ROUTINE W REFLEX MICROSCOPIC - Abnormal; Notable for the following components:   APPearance HAZY (*)    Glucose, UA 50 (*)    Hgb urine dipstick LARGE (*)    Ketones, ur 80 (*)    Bacteria, UA RARE (*)    All other components within normal limits  RAPID URINE DRUG SCREEN, HOSP PERFORMED - Abnormal; Notable for the following components:   Tetrahydrocannabinol POSITIVE (*)    All other components within normal limits    LIPASE, BLOOD  HCG, QUANTITATIVE, PREGNANCY  I-STAT BETA HCG BLOOD, ED ([facility], [facility], [facility] ONLY)    EKG EKG Interpretation  Date/Time:  Saturday August 31 2019 00:01:14 EST Ventricular Rate:  62 PR Interval:  184 QRS Duration: 66 QT Interval:  426 QTC Calculation: 432 R Axis:   81 Text Interpretation: Normal sinus rhythm Septal infarct , age undetermined Abnormal ECG No significant change was found Confirmed by Ezequiel Essex [phone]) on 08/31/2019 12:26:51 AM   Radiology US Transvaginal Non-OB  Result Date: 08/31/2019 CLINICAL DATA:  Pelvic pain EXAM: TRANSABDOMINAL AND TRANSVAGINAL ULTRASOUND OF PELVIS DOPPLER ULTRASOUND OF OVARIES TECHNIQUE: Both transabdominal and transvaginal ultrasound examinations of the pelvis were performed. Transabdominal technique was performed for global imaging of the pelvis including uterus, ovaries, adnexal regions, and pelvic cul-de-sac. It was necessary to proceed with endovaginal exam following the transabdominal exam to visualize the ovaries. Color and duplex Doppler ultrasound was utilized to evaluate blood flow to the ovaries. COMPARISON:  July 11, 2019.  June 22, 2019 FINDINGS: Uterus Measurements: 8.4 x 8.1 x 6 cm = volume: 212 mL. Multiple fibroids are noted measuring to approximately 5.5 cm. Endometrium Thickness: 19 mm.  No focal abnormality visualized. Right ovary Measurements: 3.3 x 2.9 x 1.7 cm = volume: 8.5 mL. Normal appearance/no adnexal mass. Left ovary Measurements: 3.3 x 1.6 x 2.2 cm = volume: 6 mL. Normal appearance/no adnexal mass. Pulsed Doppler evaluation of both ovaries demonstrates normal venous and arterial waveforms involving the left ovary. Only arterial waveforms are visualized involving the right ovary. Other findings There is a trace amount of free fluid in the patient's pelvis. IMPRESSION: 1. Limited study. The patient was unable to tolerate the complete transvaginal examination. 2. No acute abnormality. 3. Fibroid uterus. 4.  Thickened endometrial stripe measuring  approximately 19 mm. Endometrial thickness is considered abnormal. Consider follow-up by [place] in 6-8 weeks, during the week immediately following menses (exam timing is critical). Electronically Signed   By: Constance Holster M.D.   On: 08/31/2019 02:08   US Pelvis Complete  Result Date: 08/31/2019 CLINICAL DATA:  Pelvic pain EXAM: TRANSABDOMINAL AND TRANSVAGINAL ULTRASOUND OF PELVIS DOPPLER ULTRASOUND OF OVARIES TECHNIQUE: Both transabdominal and transvaginal ultrasound examinations of the pelvis were performed. Transabdominal technique was performed for global imaging of the pelvis including uterus, ovaries, adnexal regions, and pelvic cul-de-sac. It was necessary to proceed with endovaginal exam following the transabdominal exam to visualize the ovaries. Color and duplex Doppler ultrasound was utilized to evaluate blood flow to the ovaries. COMPARISON:  July 11, 2019.  June 22, 2019 FINDINGS: Uterus Measurements: 8.4 x 8.1 x 6 cm = volume: 212 mL. Multiple fibroids are noted measuring to approximately 5.5 cm. Endometrium Thickness: 19 mm.  No focal abnormality visualized. Right ovary Measurements: 3.3 x 2.9 x 1.7 cm = volume: 8.5 mL. Normal appearance/no adnexal mass. Left ovary Measurements: 3.3 x 1.6 x 2.2 cm = volume: 6 mL. Normal appearance/no adnexal mass. Pulsed Doppler evaluation of both ovaries demonstrates normal venous and arterial waveforms involving the left ovary. Only arterial waveforms are visualized involving the right ovary. Other findings There is a trace amount of free fluid in the patient's pelvis. IMPRESSION: 1. Limited study. The patient was unable to tolerate the complete transvaginal examination. 2. No acute abnormality. 3. Fibroid uterus. 4. Thickened endometrial stripe measuring approximately 19 mm. Endometrial thickness is considered abnormal. Consider follow-up by [place] in 6-8 weeks, during the week immediately following menses (exam timing  is critical). Electronically Signed   By: Constance Holster M.D.   On: 08/31/2019 02:08   [place] Art/Ven Flow Abd Pelv Doppler  Result Date: 08/31/2019 CLINICAL DATA:  Pelvic pain EXAM: TRANSABDOMINAL AND TRANSVAGINAL ULTRASOUND OF PELVIS DOPPLER ULTRASOUND OF OVARIES TECHNIQUE: Both transabdominal and transvaginal ultrasound examinations of the pelvis were performed. Transabdominal technique was performed for global imaging of the pelvis including uterus, ovaries, adnexal regions, and pelvic cul-de-sac. It was necessary to proceed with endovaginal exam following the transabdominal exam to visualize the ovaries. Color and duplex Doppler ultrasound was utilized to evaluate blood flow to the ovaries. COMPARISON:  July 11, 2019.  June 22, 2019 FINDINGS: Uterus Measurements: 8.4 x 8.1 x 6 cm = volume: 212 mL. Multiple fibroids are noted measuring to approximately 5.5 cm. Endometrium Thickness: 19 mm.  No focal abnormality visualized. Right ovary Measurements: 3.3 x 2.9 x 1.7 cm = volume: 8.5 mL. Normal appearance/no adnexal mass. Left ovary Measurements: 3.3 x 1.6 x 2.2 cm = volume: 6 mL. Normal appearance/no adnexal mass. Pulsed Doppler evaluation of both ovaries demonstrates normal venous and arterial waveforms involving the left ovary. Only arterial waveforms are visualized involving the right ovary. Other findings There is a trace amount of free fluid in the patient's pelvis. IMPRESSION: 1. Limited study. The patient was unable to tolerate the complete transvaginal examination. 2. No acute abnormality. 3. Fibroid uterus. 4. Thickened endometrial stripe measuring approximately 19 mm. Endometrial thickness is considered abnormal. Consider follow-up by [place] in 6-8 weeks, during the week immediately following menses (exam timing is critical). Electronically Signed   By: Constance Holster M.D.   On: 08/31/2019 02:08    Procedures Procedures (including critical care time)  Medications Ordered in  ED Medications  sodium chloride 0.9 % bolus 1,000 mL (has no administration in time range)  ondansetron (ZOFRAN) injection 4 mg (has no administration in time range)  droperidol (INAPSINE) 2.5 MG/ML injection 2.5 mg (has no administration in time range)    ED Course  I have reviewed the triage vital signs and the nursing notes.  Pertinent labs & imaging results that were available during my care of the patient were reviewed by me and considered in my medical decision making (see chart for details).    MDM Rules/Calculators/A&P                      Acute on chronic pelvic pain with nausea and vomiting.  She states this is due to her fibroids.  Also has a history of hyperemesis syndrome.  Patient abdomen is soft.  She is not pregnant.  Labs show stable anemia. She refuses pelvic exam  Ketones in UA as she usually does. Potassium replaced.  Patient continues to request narcotic pain medication.  She is given droperidol, Ativan, Phenergan and topical capsaicin.  Resting comfortably on recheck.  Soft abdomen.  Ultrasound as above with fibroid uterus full exam.  Arterial waveform seen at both ovaries but venous waveforms not seen of right ovary.  Discussed with Dr. Nyoka Cowden of radiology.  He suspects this is due to patient's uncooperation.  Arterial waveform of right ovary appears normal and is normal in size.  Dr. Nyoka Cowden is not concerned for torsion nor am I clinically.   Patient has no right-sided abdominal pain. Ultrasound does show a thickened endometrial stripe which will need gynecology follow-up.  Sleeping comfortably after medications. No further vomiting in the ED. D/w patient narcotics are not indicated for cyclic vomiting and will likely worsening it. Advised marijuana cessation.   Soft abdomen on recheck, tolerating PO. Advised of US findings and need for followup. Return precautions discussed.  Final Clinical Impression(s) / ED Diagnoses Final diagnoses:  Cannabinoid hyperemesis  syndrome    Rx / DC Orders ED Discharge Orders         Ordered    promethazine (PHENERGAN) 25 MG suppository  Every 6 hours PRN     08/31/19 0644    promethazine (PHENERGAN) 25 MG tablet  Every 6 hours PRN     08/31/19 0644    potassium chloride (KLOR-CON) 10 MEQ tablet  2 times daily     08/31/19 0646           Lyndal Alamillo, Annie Main, MD 08/31/19 [phone]

## 2019-08-31 ENCOUNTER — Emergency Department (HOSPITAL_COMMUNITY): Payer: 59

## 2019-08-31 LAB — URINALYSIS, ROUTINE W REFLEX MICROSCOPIC
Bilirubin Urine: NEGATIVE
Glucose, UA: 50 mg/dL — AB
Ketones, ur: 80 mg/dL — AB
Leukocytes,Ua: NEGATIVE
Nitrite: NEGATIVE
Protein, ur: NEGATIVE mg/dL
Specific Gravity, Urine: 1.015 (ref 1.005–1.030)
pH: 7 (ref 5.0–8.0)

## 2019-08-31 LAB — RAPID URINE DRUG SCREEN, HOSP PERFORMED
Amphetamines: NOT DETECTED
Barbiturates: NOT DETECTED
Benzodiazepines: NOT DETECTED
Cocaine: NOT DETECTED
Opiates: NOT DETECTED
Tetrahydrocannabinol: POSITIVE — AB

## 2019-08-31 LAB — CBC WITH DIFFERENTIAL/PLATELET
Abs Immature Granulocytes: 0.02 K/uL (ref 0.00–0.07)
Basophils Absolute: 0 K/uL (ref 0.0–0.1)
Basophils Relative: 0 %
Eosinophils Absolute: 0 K/uL (ref 0.0–0.5)
Eosinophils Relative: 0 %
HCT: 30.8 % — ABNORMAL LOW (ref 36.0–46.0)
Hemoglobin: 9.8 g/dL — ABNORMAL LOW (ref 12.0–15.0)
Immature Granulocytes: 0 %
Lymphocytes Relative: 14 %
Lymphs Abs: 1.1 K/uL (ref 0.7–4.0)
MCH: 28.2 pg (ref 26.0–34.0)
MCHC: 31.8 g/dL (ref 30.0–36.0)
MCV: 88.8 fL (ref 80.0–100.0)
Monocytes Absolute: 0.7 K/uL (ref 0.1–1.0)
Monocytes Relative: 9 %
Neutro Abs: 6.2 K/uL (ref 1.7–7.7)
Neutrophils Relative %: 77 %
Platelets: 367 K/uL (ref 150–400)
RBC: 3.47 MIL/uL — ABNORMAL LOW (ref 3.87–5.11)
RDW: 13.5 % (ref 11.5–15.5)
WBC: 8 K/uL (ref 4.0–10.5)
nRBC: 0 % (ref 0.0–0.2)

## 2019-08-31 LAB — COMPREHENSIVE METABOLIC PANEL
ALT: 20 U/L (ref 0–44)
AST: 24 U/L (ref 15–41)
Albumin: 4.4 g/dL (ref 3.5–5.0)
Alkaline Phosphatase: 46 U/L (ref 38–126)
Anion gap: 11 (ref 5–15)
BUN: 11 mg/dL (ref 6–20)
CO2: 23 mmol/L (ref 22–32)
Calcium: 9.2 mg/dL (ref 8.9–10.3)
Chloride: 103 mmol/L (ref 98–111)
Creatinine, Ser: 0.75 mg/dL (ref 0.44–1.00)
GFR calc Af Amer: >60 mL/min (ref >60)
GFR calc non Af Amer: >60 mL/min (ref >60)
Glucose, Bld: 110 mg/dL — ABNORMAL HIGH (ref 70–99)
Potassium: 3.1 mmol/L — ABNORMAL LOW (ref 3.5–5.1)
Sodium: 137 mmol/L (ref 135–145)
Total Bilirubin: 1 mg/dL (ref 0.3–1.2)
Total Protein: 8.3 g/dL — ABNORMAL HIGH (ref 6.5–8.1)

## 2019-08-31 LAB — LIPASE, BLOOD: Lipase: 20 U/L (ref 11–51)

## 2019-08-31 LAB — HCG, QUANTITATIVE, PREGNANCY: hCG, Beta Chain, Quant, S: <1 m[IU]/mL

## 2019-08-31 MED ORDER — POTASSIUM CHLORIDE ER 10 MEQ PO TBCR: 10.0000 meq | EXTENDED_RELEASE_TABLET | Freq: Two times a day (BID) | ORAL | 0 refills | Status: DC

## 2019-08-31 MED ORDER — PROMETHAZINE HCL 25 MG/ML IJ SOLN
12.5000 mg | Freq: Once | INTRAMUSCULAR | Status: AC
  Administered 2019-08-31: 12.5 mg via INTRAVENOUS
  Filled 2019-08-31: qty 1

## 2019-08-31 MED ORDER — HALOPERIDOL LACTATE 5 MG/ML IJ SOLN
2.5000 mg | Freq: Once | INTRAMUSCULAR | Status: AC
  Administered 2019-08-31: 2.5 mg via INTRAVENOUS
  Filled 2019-08-31: qty 1

## 2019-08-31 MED ORDER — CAPSAICIN 0.025 % EX CREA
TOPICAL_CREAM | Freq: Once | CUTANEOUS | Status: AC
  Filled 2019-08-31: qty 60

## 2019-08-31 MED ORDER — KETOROLAC TROMETHAMINE 30 MG/ML IJ SOLN
30.0000 mg | Freq: Once | INTRAMUSCULAR | Status: AC
  Administered 2019-08-31: 30 mg via INTRAVENOUS
  Filled 2019-08-31: qty 1

## 2019-08-31 MED ORDER — LORAZEPAM 2 MG/ML IJ SOLN
1.0000 mg | Freq: Once | INTRAMUSCULAR | Status: AC
  Administered 2019-08-31: 1 mg via INTRAVENOUS
  Filled 2019-08-31: qty 1

## 2019-08-31 MED ORDER — POTASSIUM CHLORIDE 10 MEQ/100ML IV SOLN
10.0000 meq | INTRAVENOUS | Status: AC
  Administered 2019-08-31 (×2): 10 meq via INTRAVENOUS
  Filled 2019-08-31 (×2): qty 100

## 2019-08-31 MED ORDER — PROMETHAZINE HCL 25 MG PO TABS: 25.0000 mg | ORAL_TABLET | Freq: Four times a day (QID) | ORAL | 0 refills | Status: DC | PRN

## 2019-08-31 MED ORDER — PROMETHAZINE HCL 25 MG RE SUPP: 25.0000 mg | Freq: Four times a day (QID) | RECTAL | 0 refills | Status: DC | PRN

## 2019-08-31 NOTE — ED Notes (Signed)
Pt awake in the room. Pt called ride for d/c. NAD noted.

## 2019-08-31 NOTE — ED Notes (Signed)
Pt up walking to the bathroom. Pt states she tried to drink fluids but vomited. Full monitor on. MD notified. Will continue to monitor.

## 2019-08-31 NOTE — Discharge Instructions (Signed)
Stop using marijuana.  Take the nausea medication as prescribed.  Follow-up with your doctor.  Return to the ED if you develop new or worsening symptoms. Your ultrasound showed some thickening of the lining of your uterus.  This should be followed up by her gynecologist in 6 to 8 weeks (1 week after your menses) to make sure this is nothing to be concerned about.

## 2019-08-31 NOTE — ED Notes (Signed)
Pt lying in bed, eye's closed, chest rising and falling. Full monitor on. Will continue to monitor.  

## 2019-08-31 NOTE — ED Notes (Signed)
Pt lying in bed, eye's closed, chest rising and falling. NAD noted. Will continue to monitor.  

## 2019-08-31 NOTE — ED Notes (Signed)
Pt lying in bed. Full monitor on. meds given. Will continue to monitor.

## 2019-08-31 NOTE — ED Notes (Signed)
Pt lying in bed, eye's closed, chest rising and falling. Will continue to monitor.  

## 2019-09-02 ENCOUNTER — Other Ambulatory Visit: Payer: Self-pay

## 2019-09-02 ENCOUNTER — Telehealth: Payer: Self-pay | Admitting: Obstetrics and Gynecology

## 2019-09-02 ENCOUNTER — Emergency Department (HOSPITAL_COMMUNITY)
Admission: EM | Admit: 2019-09-02 | Discharge: 2019-09-02 | Disposition: A | Discharge status: Home / Self Care | Payer: 59 | Attending: Emergency Medicine | Admitting: Emergency Medicine

## 2019-09-02 ENCOUNTER — Encounter (HOSPITAL_COMMUNITY): Payer: Self-pay

## 2019-09-02 DIAGNOSIS — R1084 Generalized abdominal pain: Secondary | ICD-10-CM

## 2019-09-02 DIAGNOSIS — D259 Leiomyoma of uterus, unspecified: Secondary | ICD-10-CM | POA: Diagnosis not present

## 2019-09-02 DIAGNOSIS — Z87891 Personal history of nicotine dependence: Secondary | ICD-10-CM | POA: Diagnosis not present

## 2019-09-02 DIAGNOSIS — J45909 Unspecified asthma, uncomplicated: Secondary | ICD-10-CM | POA: Diagnosis not present

## 2019-09-02 DIAGNOSIS — R102 Pelvic and perineal pain: Secondary | ICD-10-CM | POA: Diagnosis present

## 2019-09-02 DIAGNOSIS — D219 Benign neoplasm of connective and other soft tissue, unspecified: Secondary | ICD-10-CM

## 2019-09-02 LAB — CBC WITH DIFFERENTIAL/PLATELET
Abs Immature Granulocytes: 0.01 10*3/uL (ref 0.00–0.07)
Basophils Absolute: 0 10*3/uL (ref 0.0–0.1)
Basophils Relative: 1 %
Eosinophils Absolute: 0 10*3/uL (ref 0.0–0.5)
Eosinophils Relative: 0 %
HCT: 40.2 % (ref 36.0–46.0)
Hemoglobin: 12.7 g/dL (ref 12.0–15.0)
Immature Granulocytes: 0 %
Lymphocytes Relative: 25 %
Lymphs Abs: 1.8 10*3/uL (ref 0.7–4.0)
MCH: 27.7 pg (ref 26.0–34.0)
MCHC: 31.6 g/dL (ref 30.0–36.0)
MCV: 87.6 fL (ref 80.0–100.0)
Monocytes Absolute: 0.7 10*3/uL (ref 0.1–1.0)
Monocytes Relative: 9 %
Neutro Abs: 4.7 10*3/uL (ref 1.7–7.7)
Neutrophils Relative %: 65 %
Platelets: 473 10*3/uL — ABNORMAL HIGH (ref 150–400)
RBC: 4.59 MIL/uL (ref 3.87–5.11)
RDW: 13.5 % (ref 11.5–15.5)
WBC: 7.3 10*3/uL (ref 4.0–10.5)
nRBC: 0 % (ref 0.0–0.2)

## 2019-09-02 LAB — COMPREHENSIVE METABOLIC PANEL
ALT: 22 U/L (ref 0–44)
AST: 19 U/L (ref 15–41)
Albumin: 5.1 g/dL — ABNORMAL HIGH (ref 3.5–5.0)
Alkaline Phosphatase: 58 U/L (ref 38–126)
Anion gap: 15 (ref 5–15)
BUN: 18 mg/dL (ref 6–20)
CO2: 27 mmol/L (ref 22–32)
Calcium: 9.9 mg/dL (ref 8.9–10.3)
Chloride: 95 mmol/L — ABNORMAL LOW (ref 98–111)
Creatinine, Ser: 0.97 mg/dL (ref 0.44–1.00)
GFR calc Af Amer: >60 mL/min (ref >60)
GFR calc non Af Amer: >60 mL/min (ref >60)
Glucose, Bld: 88 mg/dL (ref 70–99)
Potassium: 2.8 mmol/L — ABNORMAL LOW (ref 3.5–5.1)
Sodium: 137 mmol/L (ref 135–145)
Total Bilirubin: 1.5 mg/dL — ABNORMAL HIGH (ref 0.3–1.2)
Total Protein: 10 g/dL — ABNORMAL HIGH (ref 6.5–8.1)

## 2019-09-02 LAB — HCG, QUANTITATIVE, PREGNANCY: hCG, Beta Chain, Quant, S: <1 m[IU]/mL (ref <5)

## 2019-09-02 LAB — LIPASE, BLOOD: Lipase: 49 U/L (ref 11–51)

## 2019-09-02 MED ORDER — ONDANSETRON 4 MG PO TBDP: 4.0000 mg | ORAL_TABLET | Freq: Three times a day (TID) | ORAL | 0 refills | Status: DC | PRN

## 2019-09-02 MED ORDER — MORPHINE SULFATE (PF) 4 MG/ML IV SOLN
4.0000 mg | Freq: Once | INTRAVENOUS | Status: AC
  Administered 2019-09-02: 4 mg via INTRAVENOUS
  Filled 2019-09-02: qty 1

## 2019-09-02 MED ORDER — HALOPERIDOL LACTATE 5 MG/ML IJ SOLN
5.0000 mg | Freq: Once | INTRAMUSCULAR | Status: AC
  Administered 2019-09-02: 5 mg via INTRAVENOUS
  Filled 2019-09-02: qty 1

## 2019-09-02 MED ORDER — KETOROLAC TROMETHAMINE 30 MG/ML IJ SOLN
30.0000 mg | Freq: Once | INTRAMUSCULAR | Status: AC
  Administered 2019-09-02: 30 mg via INTRAVENOUS
  Filled 2019-09-02: qty 1

## 2019-09-02 MED ORDER — POTASSIUM CHLORIDE CRYS ER 20 MEQ PO TBCR
20.0000 meq | EXTENDED_RELEASE_TABLET | Freq: Once | ORAL | Status: AC
  Administered 2019-09-02: 20 meq via ORAL
  Filled 2019-09-02: qty 1

## 2019-09-02 MED ORDER — POTASSIUM CHLORIDE 10 MEQ/100ML IV SOLN
10.0000 meq | Freq: Once | INTRAVENOUS | Status: AC
  Administered 2019-09-02: 10 meq via INTRAVENOUS
  Filled 2019-09-02: qty 100

## 2019-09-02 MED ORDER — SODIUM CHLORIDE 0.9 % IV BOLUS
1000.0000 mL | Freq: Once | INTRAVENOUS | Status: AC
  Administered 2019-09-02: 1000 mL via INTRAVENOUS

## 2019-09-02 MED ORDER — ONDANSETRON HCL 4 MG/2ML IJ SOLN
4.0000 mg | Freq: Once | INTRAMUSCULAR | Status: AC
  Administered 2019-09-02: 4 mg via INTRAVENOUS
  Filled 2019-09-02: qty 2

## 2019-09-02 MED ORDER — CAPSAICIN 0.025 % EX CREA
TOPICAL_CREAM | CUTANEOUS | Status: AC
  Filled 2019-09-02: qty 60

## 2019-09-02 NOTE — ED Provider Notes (Signed)
[facility] DEPT Provider Note   CSN: [id number] Arrival date & time: 09/02/19  [id number]     History Chief Complaint  Patient presents with  . Abdominal Pain    Carrie Guerra is a 34 y.o. female.  HPI  Patient is a 34 year old female with a past medical history significant for cannabinoid hyperemesis syndrome, fibroids.   Presented today with generalized abdominal pain that is achy, constant, cramping, worse with her menstrual periods.  She is endorsing associated nausea and nonbloody nonbilious emesis similar to prior episodes.  Patient states that her last cannabis use was at least 2 weeks ago.  She states that she is currently on menstrual cycle and states that the symptoms occur anytime she is on her cycle.  Patient states that she has attempted to follow-up with OB/GYN in the past however insurance has been an issue for this.  She denies any chest pain, shortness of breath, fever, chills, headache, dizziness.     Past Medical History:  Diagnosis Date  . Asthma   . 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 Fibroid   . Fibroids   . Migraines    menstrual migraine wiht aura    Patient Active Problem List   Diagnosis Date Noted  . Vomiting 11/17/2016  . Hypokalemia 11/17/2016  . 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 broken hand Left    pins placed in left hand  . NO PAST SURGERIES       OB History    Gravida  0   Para  0   Term  0   Preterm  0   AB  0   Living  0     SAB  0   TAB  0   Ectopic  0   Multiple  0   Live Births  0           Family History  Problem Relation Age of Onset  . Diabetes Mother   . Breast cancer Mother        Pt.thinks she may have had br.ca  . Hypertension Mother   . Diabetes Other   . Hypertension Maternal Grandmother     Social  History   Tobacco Use  . Smoking status: Former Smoker    Types: Cigarettes  . Smokeless tobacco: Never Used  . Tobacco comment: 11/17/2016 "someday smoker when I did smoke"  Substance Use Topics  . Alcohol use: Yes    Comment: occassionly  . Drug use: Yes    Types: Marijuana    Comment: Hx of marijuana use    Home Medications Prior to Admission medications   Medication Sig Start Date End Date Taking? Authorizing Provider  Acetaminophen-Caff-Pyrilamine (MIDOL COMPLETE PO) Take 2 tablets by mouth as needed (cramping/pain).   Yes [provider]  ibuprofen (ADVIL,MOTRIN) 200 MG tablet Take 400 mg by mouth every 8 (eight) hours as needed for moderate pain.    Yes [provider]  potassium chloride (KLOR-CON) 10 MEQ tablet Take 1 tablet (10 mEq total) by mouth 2 (two) times daily. 08/31/19  Yes Rancour, Annie Main, MD  promethazine (PHENERGAN) 25 MG tablet Take 1 tablet (25 mg total) by mouth every 6 (six) hours as needed for nausea or vomiting. 08/31/19  Yes Rancour, Annie Main, MD  levonorgestrel-ethinyl estradiol (SEASONALE) 0.15-0.03 MG tablet Take 1 tablet by mouth  daily. Patient not taking: Reported on 08/29/2019 07/11/19   Nunzio Cobbs, MD  ondansetron (ZOFRAN ODT) 4 MG disintegrating tablet Take 1 tablet (4 mg total) by mouth every 8 (eight) hours as needed for nausea or vomiting. 09/02/19   Tedd Sias, PA  pantoprazole (PROTONIX) 20 MG tablet Take 1 tablet (20 mg total) by mouth daily. Patient not taking: Reported on 08/29/2019 06/22/19   Muthersbaugh, [name], PA-C  potassium chloride SA (KLOR-CON) 20 MEQ tablet Take 1 tablet (20 mEq total) by mouth 2 (two) times daily for 5 days. Patient not taking: Reported on 08/29/2019 06/26/19 07/01/19  McDonald, Mia A, PA-C  promethazine (PHENERGAN) 25 MG suppository Place 1 suppository (25 mg total) rectally every 6 (six) hours as needed for nausea or vomiting. 08/31/19   Rancour, Annie Main, MD  sucralfate (CARAFATE) 1 g tablet  Take 1 tablet (1 g total) by mouth 4 (four) times daily -  with meals and at bedtime. Patient not taking: Reported on 08/29/2019 06/22/19   Muthersbaugh, [name], PA-C  prochlorperazine (COMPAZINE) 25 MG suppository Place 1 suppository (25 mg total) rectally every 12 (twelve) hours as needed for nausea or vomiting. Patient not taking: Reported on 04/04/2017 11/08/16 06/22/19  Lorin Glass, PA-C    Allergies    Shrimp [shellfish allergy] and Cabbage  Review of Systems   Review of Systems  Constitutional: Negative for chills and fever.  HENT: Negative for congestion.   Eyes: Negative for pain.  Respiratory: Negative for cough and shortness of breath.   Cardiovascular: Negative for chest pain and leg swelling.  Gastrointestinal: Positive for abdominal pain, nausea and vomiting.  Genitourinary: Positive for vaginal bleeding (menstrual ) and vaginal pain. Negative for vaginal discharge.  Musculoskeletal: Negative for myalgias.  Skin: Negative for rash.  Neurological: Negative for dizziness and headaches.    Physical Exam Updated Vital Signs BP (!) 143/91   Pulse 72   Temp 97.9 F (36.6 C) (Oral)   Resp 15   LMP 08/29/2019   SpO2 100%   Physical Exam Vitals and nursing note reviewed.  Constitutional:      General: She is not in acute distress.    Comments: Patient appears to be acutely uncomfortable.  This pleasant 34 year old female appears stated age.  No answer questions appropriately follow commands.  HENT:     Head: Normocephalic and atraumatic.     Nose: Nose normal.     Mouth/Throat:     Mouth: Mucous membranes are dry.  Eyes:     General: No scleral icterus. Cardiovascular:     Rate and Rhythm: Normal rate and regular rhythm.     Pulses: Normal pulses.     Heart sounds: Normal heart sounds.  Pulmonary:     Effort: Pulmonary effort is normal. No respiratory distress.     Breath sounds: No wheezing.  Abdominal:     Palpations: Abdomen is soft.     Tenderness:  There is no abdominal tenderness. There is no guarding or rebound. Negative signs include Murphy's sign, McBurney's sign, psoas sign and obturator sign.     Hernia: No hernia is present.     Comments: Generalized abdominal tenderness.  Musculoskeletal:     Cervical back: Normal range of motion.     Right lower leg: No edema.     Left lower leg: No edema.  Skin:    General: Skin is warm and dry.     Capillary Refill: Capillary refill takes less than 2 seconds.  Neurological:     Mental Status: She is alert. Mental status is at baseline.  Psychiatric:        Mood and Affect: Mood normal.        Behavior: Behavior normal.     ED Results / Procedures / Treatments   Labs (all labs ordered are listed, but only abnormal results are displayed) Labs Reviewed  CBC WITH DIFFERENTIAL/PLATELET - Abnormal; Notable for the following components:      Result Value   Platelets 473 (*)    All other components within normal limits  COMPREHENSIVE METABOLIC PANEL - Abnormal; Notable for the following components:   Potassium 2.8 (*)    Chloride 95 (*)    Total Protein 10.0 (*)    Albumin 5.1 (*)    Total Bilirubin 1.5 (*)    All other components within normal limits  WET PREP, GENITAL  LIPASE, BLOOD  HCG, QUANTITATIVE, PREGNANCY    EKG None  Radiology No results found.  Procedures Procedures (including critical care time)  Medications Ordered in ED Medications  ketorolac (TORADOL) 30 MG/ML injection 30 mg (30 mg Intravenous Given 09/02/19 0906)  ondansetron (ZOFRAN) injection 4 mg (4 mg Intravenous Given 09/02/19 0906)  sodium chloride 0.9 % bolus 1,000 mL (0 mLs Intravenous Stopped 09/02/19 1026)  potassium chloride SA (KLOR-CON) CR tablet 20 mEq (20 mEq Oral Given 09/02/19 1044)  potassium chloride 10 mEq in 100 mL IVPB (0 mEq Intravenous Stopped 09/02/19 1143)  capsaicin (ZOSTRIX) 0.025 % cream ( Topical Given 09/02/19 1237)  haloperidol lactate (HALDOL) injection 5 mg (5 mg Intravenous Given  09/02/19 1042)  morphine 4 MG/ML injection 4 mg (4 mg Intravenous Given 09/02/19 1042)    ED Course  I have reviewed the triage vital signs and the nursing notes.  Pertinent labs & imaging results that were available during my care of the patient were reviewed by me and considered in my medical decision making (see chart for details).  Patient is a 34 year old female with a past medical history significant for cannabinoid hyperemesis syndrome, fibroids.   Review of EMR patient has had a ultrasound of her pelvis done 2 days ago which showed significant fibroid uterus.  She also had a thickened endometrial stripe which was abnormally wide.  At 19 mm.  Plan at discharge she is ago was to follow-up with OB/GYN.  She has not made this appointment because she states that she has a trouble with insurance.  She is insured.  I recommend that she call her insurer and discuss follow-up options.  I also referred her to our [facility] on [facility] which she will follow up with she is unable to obtain a follow-up on her own.  Patient states that she has been vomiting the Phenergan that she was prescribed.  I recommended that she use this as a suppository or use zofran ODT which I have prescribed her.   Repeat examination after fluids and antiemetics and morphine and Toradol patient states that her pain is now resolved.  Is tolerating p.o. without difficulty.  Doubt a acute disease causing her symptoms today.  Suspect that this is secondary to her fibroids which are chronic.  She will follow up with OB/GYN.   --------------------- The medical records were personally reviewed by myself. I personally reviewed all lab results and interpreted all imaging studies and either concurred with their official read or contacted radiology for clarification.   This patient appears reasonably screened and I doubt any other medical  condition requiring further workup, evaluation, or treatment in the ED at this time  prior to discharge.   Patient's vitals are WNL apart from vital sign abnormalities discussed above, patient is in NAD, and able to ambulate in the ED at their baseline and able to tolerate PO.  Pain has been managed or a plan has been made for home management and has no complaints prior to discharge. Patient is comfortable with above plan and for discharge at this time. All questions were answered prior to disposition. Results from the ER workup discussed with the patient face to face and all questions answered to the best of my ability. The patient is safe for discharge with strict return precautions. Patient appears safe for discharge with appropriate follow-up. Conveyed my impression with the patient and they voiced understanding and are agreeable to plan.   An After Visit Summary was printed and given to the patient.  Portions of this note were generated with [employer]. Dictation errors may occur despite best attempts at proofreading.      MDM Rules/Calculators/A&P                      Final Clinical Impression(s) / ED Diagnoses Final diagnoses:  Generalized abdominal pain  Fibroid    Rx / DC Orders ED Discharge Orders         Ordered    ondansetron (ZOFRAN ODT) 4 MG disintegrating tablet  Every 8 hours PRN     09/02/19 1232           Pati Gallo [place], [place] 09/03/19 [id number]    Milton Ferguson, MD 09/06/19 1512

## 2019-09-02 NOTE — Progress Notes (Deleted)
GYNECOLOGY  VISIT   HPI: 34 y.o.   Single  African American  female   [place] with Patient's last menstrual period was 08/29/2019.   here for ER follow up.    GYNECOLOGIC HISTORY: Patient's last menstrual period was 08/29/2019. Contraception:  ***Abstinence  Menopausal hormone therapy:  n/a Last mammogram:  n/a Last pap smear: 07-02-19 Neg:Neg HR HPV        OB History    Gravida  0   Para  0   Term  0   Preterm  0   AB  0   Living  0     SAB  0   TAB  0   Ectopic  0   Multiple  0   Live Births  0              Patient Active Problem List   Diagnosis Date Noted  . Vomiting 11/17/2016  . Hypokalemia 11/17/2016  . 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 Asthma   . 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 Fibroid   . Fibroids   . Migraines    menstrual migraine wiht aura    Past Surgical History:  Procedure Laterality Date  . broken hand Left    pins placed in left hand  . NO PAST SURGERIES      Current Outpatient Medications  Medication Sig Dispense Refill  . Acetaminophen-Caff-Pyrilamine (MIDOL COMPLETE PO) Take 2 tablets by mouth as needed (cramping/pain).    Marland Kitchen ibuprofen (ADVIL,MOTRIN) 200 MG tablet Take 400 mg by mouth every 8 (eight) hours as needed for moderate pain.     Marland Kitchen levonorgestrel-ethinyl estradiol (SEASONALE) 0.15-0.03 MG tablet Take 1 tablet by mouth daily. (Patient not taking: Reported on 08/29/2019) 1 Package 0  . ondansetron (ZOFRAN ODT) 4 MG disintegrating tablet Take 1 tablet (4 mg total) by mouth every 8 (eight) hours as needed for nausea or vomiting. 20 tablet 0  . pantoprazole (PROTONIX) 20 MG tablet Take 1 tablet (20 mg total) by mouth daily. (Patient not taking: Reported on 08/29/2019) 30 tablet 0  . potassium chloride (KLOR-CON) 10 MEQ tablet Take 1 tablet (10 mEq total) by mouth 2 (two)  times daily. 6 tablet 0  . potassium chloride SA (KLOR-CON) 20 MEQ tablet Take 1 tablet (20 mEq total) by mouth 2 (two) times daily for 5 days. (Patient not taking: Reported on 08/29/2019) 10 tablet 0  . promethazine (PHENERGAN) 25 MG suppository Place 1 suppository (25 mg total) rectally every 6 (six) hours as needed for nausea or vomiting. 12 each 0  . promethazine (PHENERGAN) 25 MG tablet Take 1 tablet (25 mg total) by mouth every 6 (six) hours as needed for nausea or vomiting. 20 tablet 0  . sucralfate (CARAFATE) 1 g tablet Take 1 tablet (1 g total) by mouth 4 (four) times daily -  with meals and at bedtime. (Patient not taking: Reported on 08/29/2019) 30 tablet 0   No current facility-administered medications for this visit.     ALLERGIES: Shrimp [shellfish allergy] and Cabbage  Family History  Problem Relation Age of Onset  . Diabetes Mother   . Breast cancer Mother        Pt.thinks she may have had br.ca  . Hypertension Mother   . Diabetes Other   . Hypertension Maternal Grandmother     Social History  Socioeconomic History  . Marital status: Single    Spouse name: Not on file  . Number of children: Not on file  . Years of education: Not on file  . Highest education level: Not on file  Occupational History  . Not on file  Tobacco Use  . Smoking status: Former Smoker    Types: Cigarettes  . Smokeless tobacco: Never Used  . Tobacco comment: 11/17/2016 "someday smoker when I did smoke"  Substance and Sexual Activity  . Alcohol use: Yes    Comment: occassionly  . Drug use: Yes    Types: Marijuana    Comment: Hx of marijuana use  . Sexual activity: Never    Birth control/protection: None, Abstinence  Other Topics Concern  . Not on file  Social History Narrative  . Not on file   Social Determinants of Health   Financial Resource Strain:   . Difficulty of Paying Living Expenses: Not on file  Food Insecurity:   . Worried About Charity fundraiser in the Last Year: Not  on file  . Ran Out of Food in the Last Year: Not on file  Transportation Needs:   . Lack of Transportation (Medical): Not on file  . Lack of Transportation (Non-Medical): Not on file  Physical Activity:   . Days of Exercise per Week: Not on file  . Minutes of Exercise per Session: Not on file  Stress:   . Feeling of Stress : Not on file  Social Connections:   . Frequency of Communication with Friends and Family: Not on file  . Frequency of Social Gatherings with Friends and Family: Not on file  . Attends Religious Services: Not on file  . Active Member of Clubs or Organizations: Not on file  . Attends Archivist Meetings: Not on file  . Marital Status: Not on file  Intimate Partner Violence:   . Fear of Current or Ex-Partner: Not on file  . Emotionally Abused: Not on file  . Physically Abused: Not on file  . Sexually Abused: Not on file    Review of Systems  PHYSICAL EXAMINATION:    LMP 08/29/2019     General appearance: alert, cooperative and appears stated age Head: Normocephalic, without obvious abnormality, atraumatic Neck: no adenopathy, supple, symmetrical, trachea midline and thyroid normal to inspection and palpation Lungs: clear to auscultation bilaterally Breasts: normal appearance, no masses or tenderness, No nipple retraction or dimpling, No nipple discharge or bleeding, No axillary or supraclavicular adenopathy Heart: regular rate and rhythm Abdomen: soft, non-tender, no masses,  no organomegaly Extremities: extremities normal, atraumatic, no cyanosis or edema Skin: Skin color, texture, turgor normal. No rashes or lesions Lymph nodes: Cervical, supraclavicular, and axillary nodes normal. No abnormal inguinal nodes palpated Neurologic: Grossly normal  Pelvic: External genitalia:  no lesions              Urethra:  normal appearing urethra with no masses, tenderness or lesions              Bartholins and Skenes: normal                 Vagina: normal  appearing vagina with normal color and discharge, no lesions              Cervix: no lesions                Bimanual Exam:  Uterus:  normal size, contour, position, consistency, mobility, non-tender  Adnexa: no mass, fullness, tenderness              Rectal exam: {yes no:[id number]}.  Confirms.              Anus:  normal sphincter tone, no lesions  Chaperone was present for exam.  ASSESSMENT     PLAN     An After Visit Summary was printed and given to the patient.  ______ minutes face to face time of which over 50% was spent in counseling.

## 2019-09-02 NOTE — Telephone Encounter (Signed)
Patient's brother, Nemiah Commander, is calling to request medical information regarding the patient. Advised we did not have permission to speak with him or release any medical information to him.

## 2019-09-02 NOTE — ED Triage Notes (Signed)
EMS reports from home, uterine fibroids x 1 year, evaluated here on Thursday and DC'd. Pt states on day 4 menstrual cycle and feels it worse during.   BP 150/90 HR 100 RR 18 Sp02 98 RA Temep 98.2

## 2019-09-02 NOTE — Telephone Encounter (Signed)
Spoke with patient. Covid 19 prescreen negative, precautions reviewed. OV scheduled for 3/9 at 4:30pm with Dr. Quincy Simmonds.   Routing to provider for final review. Patient is agreeable to disposition. Will close encounter.

## 2019-09-02 NOTE — Telephone Encounter (Signed)
Call to patient. Patient is currently at [facility] due to pain from fibroids. Patient stated that she was taken by ambulance on Thursday to [facility] in [place] due to debilitating pain. Patient stated that she left [facility] due to the ED being crowded. Patient stated that her cousin drove her to [facility] where she was seen in the ED. Patient stated that she was told that her Potassium was a 3. Patient stated that she was discharged and was brought back to the ED on Friday via ambulance and again this morning via ambulance. Patient stated that she wants Dr. Elza Rafter input on how to proceed because "I can't keep going through this every month." Patient gave verbal authorization to speak with brother, Nemiah Commander.  Routing to Dr. Quincy Simmonds to please advise. Cc: Triage.

## 2019-09-02 NOTE — Telephone Encounter (Signed)
Triage, please schedule an office appointment for patient to see me.   [place]

## 2019-09-02 NOTE — Discharge Instructions (Addendum)
Please call today to make an appointment with an OB/GYN  Please use Tylenol or ibuprofen for pain.  You may use 600 mg ibuprofen every 6 hours or 1000 mg of Tylenol every 6 hours.  You may choose to alternate between the 2.  This would be most effective.  Not to exceed 4 g of Tylenol within 24 hours.  Not to exceed 3200 mg ibuprofen 24 hours.

## 2019-09-03 ENCOUNTER — Ambulatory Visit: Payer: 59 | Admitting: Obstetrics and Gynecology

## 2019-09-03 ENCOUNTER — Emergency Department (HOSPITAL_COMMUNITY)
Admission: EM | Admit: 2019-09-03 | Discharge: 2019-09-03 | Disposition: A | Discharge status: Home / Self Care | Payer: 59 | Attending: Emergency Medicine | Admitting: Emergency Medicine

## 2019-09-03 ENCOUNTER — Ambulatory Visit: Payer: Self-pay | Admitting: Obstetrics and Gynecology

## 2019-09-03 ENCOUNTER — Other Ambulatory Visit: Payer: Self-pay

## 2019-09-03 ENCOUNTER — Encounter (HOSPITAL_COMMUNITY): Payer: Self-pay | Admitting: Emergency Medicine

## 2019-09-03 ENCOUNTER — Encounter: Payer: Self-pay | Admitting: Obstetrics and Gynecology

## 2019-09-03 ENCOUNTER — Telehealth: Payer: Self-pay | Admitting: Obstetrics and Gynecology

## 2019-09-03 ENCOUNTER — Other Ambulatory Visit: Payer: Self-pay | Admitting: Obstetrics and Gynecology

## 2019-09-03 VITALS — BP 148/98 | HR 88 | Temp 97.0°F | Ht 60.5 in | Wt 127.4 lb

## 2019-09-03 DIAGNOSIS — J45909 Unspecified asthma, uncomplicated: Secondary | ICD-10-CM | POA: Diagnosis not present

## 2019-09-03 DIAGNOSIS — R1012 Left upper quadrant pain: Secondary | ICD-10-CM

## 2019-09-03 DIAGNOSIS — R748 Abnormal levels of other serum enzymes: Secondary | ICD-10-CM

## 2019-09-03 DIAGNOSIS — N926 Irregular menstruation, unspecified: Secondary | ICD-10-CM

## 2019-09-03 DIAGNOSIS — R102 Pelvic and perineal pain: Secondary | ICD-10-CM

## 2019-09-03 DIAGNOSIS — R17 Unspecified jaundice: Secondary | ICD-10-CM | POA: Diagnosis not present

## 2019-09-03 DIAGNOSIS — D219 Benign neoplasm of connective and other soft tissue, unspecified: Secondary | ICD-10-CM

## 2019-09-03 DIAGNOSIS — R109 Unspecified abdominal pain: Secondary | ICD-10-CM | POA: Diagnosis not present

## 2019-09-03 DIAGNOSIS — R112 Nausea with vomiting, unspecified: Secondary | ICD-10-CM

## 2019-09-03 DIAGNOSIS — Z87891 Personal history of nicotine dependence: Secondary | ICD-10-CM | POA: Insufficient documentation

## 2019-09-03 DIAGNOSIS — R7309 Other abnormal glucose: Secondary | ICD-10-CM | POA: Diagnosis not present

## 2019-09-03 DIAGNOSIS — R1115 Cyclical vomiting syndrome unrelated to migraine: Secondary | ICD-10-CM | POA: Diagnosis not present

## 2019-09-03 DIAGNOSIS — R110 Nausea: Secondary | ICD-10-CM

## 2019-09-03 LAB — CBC
HCT: 35.8 % — ABNORMAL LOW (ref 36.0–46.0)
Hemoglobin: 11.4 g/dL — ABNORMAL LOW (ref 12.0–15.0)
MCH: 27.7 pg (ref 26.0–34.0)
MCHC: 31.8 g/dL (ref 30.0–36.0)
MCV: 87.1 fL (ref 80.0–100.0)
Platelets: 420 10*3/uL — ABNORMAL HIGH (ref 150–400)
RBC: 4.11 MIL/uL (ref 3.87–5.11)
RDW: 13.2 % (ref 11.5–15.5)
WBC: 6.4 10*3/uL (ref 4.0–10.5)
nRBC: 0 % (ref 0.0–0.2)

## 2019-09-03 LAB — COMPREHENSIVE METABOLIC PANEL
ALT: 17 U/L (ref 0–44)
AST: 20 U/L (ref 15–41)
Albumin: 4.3 g/dL (ref 3.5–5.0)
Alkaline Phosphatase: 49 U/L (ref 38–126)
Anion gap: 11 (ref 5–15)
BUN: 11 mg/dL (ref 6–20)
CO2: 24 mmol/L (ref 22–32)
Calcium: 9.2 mg/dL (ref 8.9–10.3)
Chloride: 101 mmol/L (ref 98–111)
Creatinine, Ser: 0.79 mg/dL (ref 0.44–1.00)
GFR calc Af Amer: >60 mL/min (ref >60)
GFR calc non Af Amer: >60 mL/min (ref >60)
Glucose, Bld: 140 mg/dL — ABNORMAL HIGH (ref 70–99)
Potassium: 2.8 mmol/L — ABNORMAL LOW (ref 3.5–5.1)
Sodium: 136 mmol/L (ref 135–145)
Total Bilirubin: 1.4 mg/dL — ABNORMAL HIGH (ref 0.3–1.2)
Total Protein: 8.2 g/dL — ABNORMAL HIGH (ref 6.5–8.1)

## 2019-09-03 LAB — URINALYSIS, ROUTINE W REFLEX MICROSCOPIC
Bilirubin Urine: NEGATIVE
Glucose, UA: NEGATIVE mg/dL
Ketones, ur: NEGATIVE mg/dL
Leukocytes,Ua: NEGATIVE
Nitrite: NEGATIVE
Protein, ur: NEGATIVE mg/dL
Specific Gravity, Urine: 1.013 (ref 1.005–1.030)
pH: 6 (ref 5.0–8.0)

## 2019-09-03 LAB — I-STAT BETA HCG BLOOD, ED (MC, WL, AP ONLY): I-stat hCG, quantitative: <5 m[IU]/mL (ref <5)

## 2019-09-03 LAB — LIPASE, BLOOD: Lipase: 78 U/L — ABNORMAL HIGH (ref 11–51)

## 2019-09-03 MED ORDER — SODIUM CHLORIDE 0.9% FLUSH: 3.0000 mL | Freq: Once | INTRAVENOUS | Status: DC

## 2019-09-03 MED ORDER — METOCLOPRAMIDE HCL 5 MG/ML IJ SOLN
10.0000 mg | Freq: Once | INTRAMUSCULAR | Status: AC
  Administered 2019-09-03: 03:00:00 10 mg via INTRAVENOUS
  Filled 2019-09-03: qty 2

## 2019-09-03 MED ORDER — KETOROLAC TROMETHAMINE 15 MG/ML IJ SOLN
15.0000 mg | Freq: Once | INTRAMUSCULAR | Status: AC
  Administered 2019-09-03: 15 mg via INTRAVENOUS
  Filled 2019-09-03: qty 1

## 2019-09-03 MED ORDER — POTASSIUM CHLORIDE CRYS ER 20 MEQ PO TBCR
40.0000 meq | EXTENDED_RELEASE_TABLET | Freq: Once | ORAL | Status: AC
  Administered 2019-09-03: 40 meq via ORAL
  Filled 2019-09-03: qty 2

## 2019-09-03 MED ORDER — SODIUM CHLORIDE 0.9 % IV BOLUS
1000.0000 mL | Freq: Once | INTRAVENOUS | Status: AC
  Administered 2019-09-03: 1000 mL via INTRAVENOUS

## 2019-09-03 MED ORDER — POTASSIUM CHLORIDE 10 MEQ/100ML IV SOLN
10.0000 meq | Freq: Once | INTRAVENOUS | Status: AC
  Administered 2019-09-03: 10 meq via INTRAVENOUS
  Filled 2019-09-03: qty 100

## 2019-09-03 MED ORDER — MEDROXYPROGESTERONE ACETATE 150 MG/ML IM SUSP
150.0000 mg | Freq: Once | INTRAMUSCULAR | Status: AC
  Administered 2019-09-03: 150 mg via INTRAMUSCULAR

## 2019-09-03 MED ORDER — TRAMADOL HCL 50 MG PO TABS: 50.0000 mg | ORAL_TABLET | Freq: Four times a day (QID) | ORAL | 0 refills | Status: DC | PRN

## 2019-09-03 NOTE — Telephone Encounter (Signed)
Orders placed for CT scan of abd/pelvis.   Call placed to [facility] to schedule pt. Spoke to [place].  Pt scheduled for  09/06/2019 at 3:10 pm. Pt to pick up prep on Wed or Thursday before 430pm  and to have no solid foods 4 hours prior to CT scan.   Spoke to pt. Pt agreeable to date and time of CT scan. Pt needing note for work to be off for CT scan. Letter printed and will be reviewed with Dr Quincy Simmonds. Pt aware and agreeable. Pt has f/u appt on 09/09/2019 at 9:30 am with Dr Quincy Simmonds. Pt verbalized understanding.   Routing to Dr Quincy Simmonds for review and any additional recommendations.

## 2019-09-03 NOTE — Telephone Encounter (Signed)
Please schedule CT of abdomen and pelvis for pelvic and abdominal pain   Patient has elevated lipase and bilirubin and fibroid uterus.

## 2019-09-03 NOTE — ED Notes (Signed)
I am unable to get an accurate blood pressure at this time. Will try later.

## 2019-09-03 NOTE — Progress Notes (Signed)
GYNECOLOGY  VISIT   HPI: 34 y.o.   Single  African American  female   [place] with Patient's last menstrual period was 08/29/2019.   here for pelvic pain. Patient has been evaluated with several ER visits since 08/30/19.    She presented with left upper quadrant abdominal tenderness early this am to the ER.  Hgb 11.4, WBC 6.4, and hCG < 5 today in the ER.  She has elevated lipase of 78, low K 2.8, glucose 140, elevated bilirubin 1.4.   Patient has lost 16lbs.  She has cannibus use and chronic nausea.   Has fibroids.  Her menses are coming twice a month and lasting more than 10 days.  Pad change at least 5 times in 5 hours.  Her menses stopped as of now.  Did not take Seasonale.   States it did not go to the pharmacy she uses.  Her fibroids are affecting her ability to work, which is having a financial impact on her.  She has considered hysterectomy.  She has been given the option of referral to a laparoscopic specialist for potential myomectomy.   GYNECOLOGIC HISTORY: Patient's last menstrual period was 08/29/2019. Contraception: Abstinence Menopausal hormone therapy:  n/a Last mammogram:  n/a Last pap smear: 07-02-19 Neg:Neg HR HPV        OB History    Gravida  0   Para  0   Term  0   Preterm  0   AB  0   Living  0     SAB  0   TAB  0   Ectopic  0   Multiple  0   Live Births  0              Patient Active Problem List   Diagnosis Date Noted  . Vomiting 11/17/2016  . Hypokalemia 11/17/2016  . Cannabis hyperemesis syndrome concurrent with and due to cannabis abuse ([place]) 11/09/2016    Past Medical History:  Diagnosis Date  . Asthma   . Cannabis hyperemesis syndrome concurrent with and due to cannabis abuse ([place]) 11/09/2016  . Fibroid   . Fibroids   . Migraines    menstrual migraine wiht aura    Past Surgical History:  Procedure Laterality Date  . broken hand Left    pins placed in left hand  . NO PAST SURGERIES      Current Outpatient  Medications  Medication Sig Dispense Refill  . Acetaminophen-Caff-Pyrilamine (MIDOL COMPLETE PO) Take 2 tablets by mouth as needed (cramping/pain).    Marland Kitchen ibuprofen (ADVIL,MOTRIN) 200 MG tablet Take 400 mg by mouth every 8 (eight) hours as needed for moderate pain.     Marland Kitchen ondansetron (ZOFRAN ODT) 4 MG disintegrating tablet Take 1 tablet (4 mg total) by mouth every 8 (eight) hours as needed for nausea or vomiting. 20 tablet 0  . pantoprazole (PROTONIX) 20 MG tablet Take 1 tablet (20 mg total) by mouth daily. 30 tablet 0  . potassium chloride (KLOR-CON) 10 MEQ tablet Take 1 tablet (10 mEq total) by mouth 2 (two) times daily. 6 tablet 0  . promethazine (PHENERGAN) 25 MG suppository Place 1 suppository (25 mg total) rectally every 6 (six) hours as needed for nausea or vomiting. 12 each 0  . promethazine (PHENERGAN) 25 MG tablet Take 1 tablet (25 mg total) by mouth every 6 (six) hours as needed for nausea or vomiting. 20 tablet 0  . sucralfate (CARAFATE) 1 g tablet Take 1 tablet (1 g total) by mouth  4 (four) times daily -  with meals and at bedtime. 30 tablet 0  . levonorgestrel-ethinyl estradiol (SEASONALE) 0.15-0.03 MG tablet Take 1 tablet by mouth daily. (Patient not taking: Reported on 09/03/2019) 1 Package 0  . potassium chloride SA (KLOR-CON) 20 MEQ tablet Take 1 tablet (20 mEq total) by mouth 2 (two) times daily for 5 days. (Patient not taking: Reported on 08/29/2019) 10 tablet 0   No current facility-administered medications for this visit.     ALLERGIES: Shrimp [shellfish allergy] and Cabbage  Family History  Problem Relation Age of Onset  . Diabetes Mother   . Breast cancer Mother        Pt.thinks she may have had br.ca  . Hypertension Mother   . Diabetes Other   . Hypertension Maternal Grandmother     Social History   Socioeconomic History  . Marital status: Single    Spouse name: Not on file  . Number of children: Not on file  . Years of education: Not on file  . Highest education  level: Not on file  Occupational History  . Not on file  Tobacco Use  . Smoking status: Former Smoker    Types: Cigarettes  . Smokeless tobacco: Never Used  . Tobacco comment: 11/17/2016 "someday smoker when I did smoke"  Substance and Sexual Activity  . Alcohol use: Yes    Comment: occassionly  . Drug use: Yes    Types: Marijuana    Comment: Hx of marijuana use  . Sexual activity: Never    Birth control/protection: None, Abstinence  Other Topics Concern  . Not on file  Social History Narrative  . Not on file   Social Determinants of Health   Financial Resource Strain:   . Difficulty of Paying Living Expenses: Not on file  Food Insecurity:   . Worried About Charity fundraiser in the Last Year: Not on file  . Ran Out of Food in the Last Year: Not on file  Transportation Needs:   . Lack of Transportation (Medical): Not on file  . Lack of Transportation (Non-Medical): Not on file  Physical Activity:   . Days of Exercise per Week: Not on file  . Minutes of Exercise per Session: Not on file  Stress:   . Feeling of Stress : Not on file  Social Connections:   . Frequency of Communication with Friends and Family: Not on file  . Frequency of Social Gatherings with Friends and Family: Not on file  . Attends Religious Services: Not on file  . Active Member of Clubs or Organizations: Not on file  . Attends Archivist Meetings: Not on file  . Marital Status: Not on file  Intimate Partner Violence:   . Fear of Current or Ex-Partner: Not on file  . Emotionally Abused: Not on file  . Physically Abused: Not on file  . Sexually Abused: Not on file    Review of Systems  Constitutional:       Pelvic pain  All other systems reviewed and are negative.   PHYSICAL EXAMINATION:    BP (!) 148/98   Pulse 88   Temp (!) 97 F (36.1 C)   Ht 5' 0.5" (1.537 m)   Wt 127 lb 6.4 oz (57.8 kg)   LMP 08/29/2019   BMI 24.47 kg/m     General appearance: alert, cooperative and  appears stated age.  Tearful.  Distressed. Lungs: clear to auscultation bilaterally Heart: regular rate and rhythm Abdomen: generalized tenderness  with guarding.    Pelvic: External genitalia:  no lesions              Urethra:   18 - 19 week size, and tender.              Bartholins and Skenes: normal                 Vagina: normal appearing vagina with normal color and discharge, no lesions              Cervix: no lesions                Bimanual Exam:  Uterus: to umbilicus.               Adnexa: no mass, fullness, tenderness             Chaperone was present for exam.  ASSESSMENT  Abdominal and pelvic pain.  Fibroids.  Elevated bilirubin and lipase.  Nausea and vomiting, chronic. Elevated glucose in the ER.  PLAN  Depo Provera 150 mg IM now.   I discussed risks and benefits.  Rx Tramadol limited prescription.  A1C.  CT of abdomen and pelvis.  Return after CT is done for discussion of results and plan for myomectomy versus hysterectomy.   Depo provera 150mg  given in rt glute. Patient tolerated injection well with no reactions.   An After Visit Summary was printed and given to the patient.  _30_____ minutes face to face time of which over 50% was spent in counseling.

## 2019-09-03 NOTE — Telephone Encounter (Signed)
Signed letter for work note placed in front office. Note also sent via mychart. Pt aware and will pick up on Wed or Thursday.

## 2019-09-03 NOTE — ED Provider Notes (Signed)
[facility] [facility] DEPT Provider Note  CSN: [id number] Arrival date & time: 09/03/19 [id number]  Chief Complaint(s) Abdominal Pain  HPI Carrie Guerra is a 34 y.o. female with a past medical history listed below who presents to the emergency department with left upper quadrant abdominal pain, radiating to her left back with associated nausea and nonbloody nonbilious emesis similar to prior episodes.  Patient was here less than 24 hours ago, and treated symptomatically.  Patient is adamant that this is not related to her cannabis use.  States that she has not smoked in 2 weeks.  Patient is currently on her menstrual cycle.  Reports a history of fibroids and an appointment with her OB/GYN later on this afternoon.  She denies any fevers or chills.  No chest pain or shortness of breath.  HPI  Past Medical History Past Medical History:  Diagnosis Date  . Asthma   . Cannabis hyperemesis syndrome concurrent with and due to cannabis abuse ([place]) 11/09/2016  . Fibroid   . Fibroids   . Migraines    menstrual migraine wiht aura   Patient Active Problem List   Diagnosis Date Noted  . Vomiting 11/17/2016  . Hypokalemia 11/17/2016  . Cannabis hyperemesis syndrome concurrent with and due to cannabis abuse ([place]) 11/09/2016   Home Medication(s) Prior to Admission medications   Medication Sig Start Date End Date Taking? Authorizing Provider  Acetaminophen-Caff-Pyrilamine (MIDOL COMPLETE PO) Take 2 tablets by mouth as needed (cramping/pain).    [provider]  ibuprofen (ADVIL,MOTRIN) 200 MG tablet Take 400 mg by mouth every 8 (eight) hours as needed for moderate pain.     [provider]  levonorgestrel-ethinyl estradiol (SEASONALE) 0.15-0.03 MG tablet Take 1 tablet by mouth daily. Patient not taking: Reported on 08/29/2019 07/11/19   Nunzio Cobbs, MD  ondansetron (ZOFRAN ODT) 4 MG disintegrating tablet Take 1 tablet (4 mg total) by mouth every 8 (eight)  hours as needed for nausea or vomiting. 09/02/19   Tedd Sias, PA  pantoprazole (PROTONIX) 20 MG tablet Take 1 tablet (20 mg total) by mouth daily. Patient not taking: Reported on 08/29/2019 06/22/19   Muthersbaugh, [name], PA-C  potassium chloride (KLOR-CON) 10 MEQ tablet Take 1 tablet (10 mEq total) by mouth 2 (two) times daily. 08/31/19   Rancour, Annie Main, MD  potassium chloride SA (KLOR-CON) 20 MEQ tablet Take 1 tablet (20 mEq total) by mouth 2 (two) times daily for 5 days. Patient not taking: Reported on 08/29/2019 06/26/19 07/01/19  McDonald, Mia A, PA-C  promethazine (PHENERGAN) 25 MG suppository Place 1 suppository (25 mg total) rectally every 6 (six) hours as needed for nausea or vomiting. 08/31/19   Rancour, Annie Main, MD  promethazine (PHENERGAN) 25 MG tablet Take 1 tablet (25 mg total) by mouth every 6 (six) hours as needed for nausea or vomiting. 08/31/19   Rancour, Annie Main, MD  sucralfate (CARAFATE) 1 g tablet Take 1 tablet (1 g total) by mouth 4 (four) times daily -  with meals and at bedtime. Patient not taking: Reported on 08/29/2019 06/22/19   Muthersbaugh, [name], PA-C  prochlorperazine (COMPAZINE) 25 MG suppository Place 1 suppository (25 mg total) rectally every 12 (twelve) hours as needed for nausea or vomiting. Patient not taking: Reported on 04/04/2017 11/08/16 06/22/19  Lorin Glass, PA-C  Past Surgical History Past Surgical History:  Procedure Laterality Date  . broken hand Left    pins placed in left hand  . NO PAST SURGERIES     Family History Family History  Problem Relation Age of Onset  . Diabetes Mother   . Breast cancer Mother        Pt.thinks she may have had br.ca  . Hypertension Mother   . Diabetes Other   . Hypertension Maternal Grandmother     Social History Social History   Tobacco Use  . Smoking status: Former Smoker     Types: Cigarettes  . Smokeless tobacco: Never Used  . Tobacco comment: 11/17/2016 "someday smoker when I did smoke"  Substance Use Topics  . Alcohol use: Yes    Comment: occassionly  . Drug use: Yes    Types: Marijuana    Comment: Hx of marijuana use   Allergies Shrimp [shellfish allergy] and Cabbage  Review of Systems Review of Systems All other systems are reviewed and are negative for acute change except as noted in the HPI  Physical Exam Vital Signs  I have reviewed the triage vital signs BP (!) 141/84 (BP Location: Left Arm)   Pulse 81   Temp 98.1 F (36.7 C) (Oral)   Resp 17 Comment: Simultaneous filing. User may not have seen previous data.  LMP 08/29/2019   SpO2 97% Comment: Simultaneous filing. User may not have seen previous data.  Physical Exam Vitals reviewed.  Constitutional:      General: She is not in acute distress.    Appearance: She is well-developed. She is not diaphoretic.  HENT:     Head: Normocephalic and atraumatic.     Right Ear: External ear normal.     Left Ear: External ear normal.     Nose: Nose normal.  Eyes:     General: No scleral icterus.    Conjunctiva/sclera: Conjunctivae normal.  Neck:     Trachea: Phonation normal.  Cardiovascular:     Rate and Rhythm: Normal rate and regular rhythm.  Pulmonary:     Effort: Pulmonary effort is normal. No respiratory distress.     Breath sounds: No stridor.  Abdominal:     General: There is no distension.     Tenderness: There is abdominal tenderness in the left upper quadrant.  Musculoskeletal:        General: Normal range of motion.     Cervical back: Normal range of motion.  Neurological:     Mental Status: She is alert and oriented to person, place, and time.  Psychiatric:        Behavior: Behavior normal.     ED Results and Treatments Labs (all labs ordered are listed, but only abnormal results are displayed) Labs Reviewed  LIPASE, BLOOD - Abnormal; Notable for the following  components:      Result Value   Lipase 78 (*)    All other components within normal limits  COMPREHENSIVE METABOLIC PANEL - Abnormal; Notable for the following components:   Potassium 2.8 (*)    Glucose, Bld 140 (*)    Total Protein 8.2 (*)    Total Bilirubin 1.4 (*)    All other components within normal limits  CBC - Abnormal; Notable for the following components:   Hemoglobin 11.4 (*)    HCT 35.8 (*)    Platelets 420 (*)    All other components within normal limits  URINALYSIS, ROUTINE W REFLEX MICROSCOPIC - Abnormal; Notable for the following  components:   Hgb urine dipstick MODERATE (*)    Bacteria, UA RARE (*)    All other components within normal limits  I-STAT BETA HCG BLOOD, ED ([facility], [facility], [facility] ONLY)                                                                                                                         EKG  EKG Interpretation  Date/Time:    Ventricular Rate:    PR Interval:    QRS Duration:   QT Interval:    QTC Calculation:   R Axis:     Text Interpretation:        Radiology No results found.  Pertinent labs & imaging results that were available during my care of the patient were reviewed by me and considered in my medical decision making (see chart for details).  Medications Ordered in ED Medications  sodium chloride flush (NS) 0.9 % injection 3 mL (3 mLs Intravenous Not Given 09/03/19 0141)  metoCLOPramide (REGLAN) injection 10 mg (10 mg Intravenous Given 09/03/19 0236)  sodium chloride 0.9 % bolus 1,000 mL (0 mLs Intravenous Stopped 09/03/19 0339)  potassium chloride 10 mEq in 100 mL IVPB (0 mEq Intravenous Stopped 09/03/19 0339)  potassium chloride SA (KLOR-CON) CR tablet 40 mEq (40 mEq Oral Given 09/03/19 0236)  ketorolac (TORADOL) 15 MG/ML injection 15 mg (15 mg Intravenous Given 09/03/19 0236)                                                                                                                                     Procedures Procedures  (including critical care time)  Medical Decision Making / ED Course I have reviewed the nursing notes for this encounter and the patient's prior records (if available in EHR or on provided paperwork).   Carrie Guerra was evaluated in Emergency Department on 09/03/2019 for the symptoms described in the history of present illness. She was evaluated in the context of the global COVID-19 pandemic, which necessitated consideration that the patient might be at risk for infection with the SARS-CoV-2 virus that causes COVID-19. Institutional protocols and algorithms that pertain to the evaluation of patients at risk for COVID-19 are in a state of rapid change based on information released by regulatory bodies including the CDC and federal and state organizations. These policies and algorithms were followed during the patient's care in the ED.  Recurrent cyclical  vomiting syndrome. Cannabinoid hyperemesis vs gastritis vs endometriosis Labs reassuring. Treated with reglan and toradol. IVF and IV K repletion  On reassessment, she is resting comfortably. No additional emesis.        Final Clinical Impression(s) / ED Diagnoses Final diagnoses:  Left upper quadrant abdominal pain  Cyclic vomiting syndrome    The patient appears reasonably screened and/or stabilized for discharge and I doubt any other medical condition or other [facility] requiring further screening, evaluation, or treatment in the ED at this time prior to discharge. Safe for discharge with strict return precautions.  Disposition: Discharge  Condition: Good  I have discussed the results, Dx and Tx plan with the patient/family who expressed understanding and agree(s) with the plan. Discharge instructions discussed at length. The patient/family was given strict return precautions who verbalized understanding of the instructions. No further questions at time of discharge.    ED Discharge Orders    None        Follow Up: Primary care provider  Schedule an appointment as soon as possible for a visit  If you do not have a primary care physician, contact [facility] at [phone] for referral     This chart was dictated using voice recognition software.  Despite best efforts to proofread,  errors can occur which can change the documentation meaning.   Fatima Blank, MD 09/03/19 [phone]

## 2019-09-03 NOTE — Telephone Encounter (Signed)
Pt called back to speak with triage RN. Pt crying on phone and wanting to be seen earlier than was what scheduled on 09/02/2019. Pt moved from 4:30 pm to 3 pm on 09/03/2019 with Dr Quincy Simmonds. Pt agreeable.Pt states went to ER again this morning and was only given nausea medication. Pt is in pain and claims " wants/needs surgery". Pt states wants Dr Quincy Simmonds to "just takeout my uterus"    Routing to Dr Quincy Simmonds for review and will close encounter.

## 2019-09-03 NOTE — ED Notes (Addendum)
Pt was yelling out saying, "Mommy!" and "they're not doing nothing!" I went in the room and explained that we are waiting for rooms to open up so we must wait for now. Also, I said that I drew her blood so we are beginning her care while she waits.

## 2019-09-03 NOTE — Telephone Encounter (Signed)
I agree with this plan and have signed the letter.

## 2019-09-03 NOTE — ED Triage Notes (Signed)
Patient is complaining of abdominal pain and back pain. Patient was seen for the same thing yesterday. Patient is hollering and screaming.

## 2019-09-03 NOTE — Telephone Encounter (Signed)
Routing to [place] for [employer].   Encounter closed.

## 2019-09-04 LAB — HEMOGLOBIN A1C
Est. average glucose Bld gHb Est-mCnc: 80 mg/dL
Hgb A1c MFr Bld: 4.4 % — ABNORMAL LOW (ref 4.8–5.6)

## 2019-09-06 ENCOUNTER — Other Ambulatory Visit: Payer: Self-pay

## 2019-09-06 ENCOUNTER — Ambulatory Visit
Admission: RE | Admit: 2019-09-06 | Discharge: 2019-09-06 | Disposition: A | Discharge status: Home / Self Care | Payer: 59 | Source: Ambulatory Visit | Attending: Obstetrics and Gynecology | Admitting: Obstetrics and Gynecology

## 2019-09-06 DIAGNOSIS — R112 Nausea with vomiting, unspecified: Secondary | ICD-10-CM

## 2019-09-06 MED ORDER — IOPAMIDOL (ISOVUE-300) INJECTION 61%
100.0000 mL | Freq: Once | INTRAVENOUS | Status: AC | PRN
  Administered 2019-09-06: 100 mL via INTRAVENOUS

## 2019-09-09 ENCOUNTER — Ambulatory Visit: Payer: Self-pay | Admitting: Obstetrics and Gynecology

## 2019-09-09 ENCOUNTER — Encounter: Payer: Self-pay | Admitting: Obstetrics and Gynecology

## 2019-09-09 NOTE — Progress Notes (Deleted)
GYNECOLOGY  VISIT   HPI: 34 y.o.   Single  African American  female   [place] with Patient's last menstrual period was 08/29/2019.   here for follow up after CT scan.   GYNECOLOGIC HISTORY: Patient's last menstrual period was 08/29/2019. Contraception: Abstinence/Depo Provera Menopausal hormone therapy:  none Last mammogram:  n/a Last pap smear: 07-02-19 Neg:Neg HR HPV        OB History    Gravida  0   Para  0   Term  0   Preterm  0   AB  0   Living  0     SAB  0   TAB  0   Ectopic  0   Multiple  0   Live Births  0              Patient Active Problem List   Diagnosis Date Noted  . Vomiting 11/17/2016  . Hypokalemia 11/17/2016  . Cannabis hyperemesis syndrome concurrent with and due to cannabis abuse ([place]) 11/09/2016    Past Medical History:  Diagnosis Date  . Asthma   . Cannabis hyperemesis syndrome concurrent with and due to cannabis abuse ([place]) 11/09/2016  . Fibroid   . Fibroids   . Migraines    menstrual migraine wiht aura    Past Surgical History:  Procedure Laterality Date  . broken hand Left    pins placed in left hand  . NO PAST SURGERIES      Current Outpatient Medications  Medication Sig Dispense Refill  . Acetaminophen-Caff-Pyrilamine (MIDOL COMPLETE PO) Take 2 tablets by mouth as needed (cramping/pain).    Marland Kitchen ibuprofen (ADVIL,MOTRIN) 200 MG tablet Take 400 mg by mouth every 8 (eight) hours as needed for moderate pain.     Marland Kitchen levonorgestrel-ethinyl estradiol (SEASONALE) 0.15-0.03 MG tablet Take 1 tablet by mouth daily. (Patient not taking: Reported on 09/03/2019) 1 Package 0  . ondansetron (ZOFRAN ODT) 4 MG disintegrating tablet Take 1 tablet (4 mg total) by mouth every 8 (eight) hours as needed for nausea or vomiting. 20 tablet 0  . pantoprazole (PROTONIX) 20 MG tablet Take 1 tablet (20 mg total) by mouth daily. 30 tablet 0  . potassium chloride (KLOR-CON) 10 MEQ tablet Take 1 tablet (10 mEq total) by mouth 2 (two) times daily. 6 tablet 0   . potassium chloride SA (KLOR-CON) 20 MEQ tablet Take 1 tablet (20 mEq total) by mouth 2 (two) times daily for 5 days. (Patient not taking: Reported on 08/29/2019) 10 tablet 0  . promethazine (PHENERGAN) 25 MG suppository Place 1 suppository (25 mg total) rectally every 6 (six) hours as needed for nausea or vomiting. 12 each 0  . promethazine (PHENERGAN) 25 MG tablet Take 1 tablet (25 mg total) by mouth every 6 (six) hours as needed for nausea or vomiting. 20 tablet 0  . sucralfate (CARAFATE) 1 g tablet Take 1 tablet (1 g total) by mouth 4 (four) times daily -  with meals and at bedtime. 30 tablet 0  . traMADol (ULTRAM) 50 MG tablet Take 1 tablet (50 mg total) by mouth every 6 (six) hours as needed. 15 tablet 0   No current facility-administered medications for this visit.     ALLERGIES: Shrimp [shellfish allergy] and Cabbage  Family History  Problem Relation Age of Onset  . Diabetes Mother   . Breast cancer Mother        Pt.thinks she may have had br.ca  . Hypertension Mother   . Diabetes Other   .  Hypertension Maternal Grandmother     Social History   Socioeconomic History  . Marital status: Single    Spouse name: Not on file  . Number of children: Not on file  . Years of education: Not on file  . Highest education level: Not on file  Occupational History  . Not on file  Tobacco Use  . Smoking status: Former Smoker    Types: Cigarettes  . Smokeless tobacco: Never Used  . Tobacco comment: 11/17/2016 "someday smoker when I did smoke"  Substance and Sexual Activity  . Alcohol use: Yes    Comment: occassionly  . Drug use: Yes    Types: Marijuana    Comment: Hx of marijuana use  . Sexual activity: Never    Birth control/protection: None, Abstinence  Other Topics Concern  . Not on file  Social History Narrative  . Not on file   Social Determinants of Health   Financial Resource Strain:   . Difficulty of Paying Living Expenses:   Food Insecurity:   . Worried About  Charity fundraiser in the Last Year:   . Arboriculturist in the Last Year:   Transportation Needs:   . Film/video editor (Medical):   Marland Kitchen Lack of Transportation (Non-Medical):   Physical Activity:   . Days of Exercise per Week:   . Minutes of Exercise per Session:   Stress:   . Feeling of Stress :   Social Connections:   . Frequency of Communication with Friends and Family:   . Frequency of Social Gatherings with Friends and Family:   . Attends Religious Services:   . Active Member of Clubs or Organizations:   . Attends Archivist Meetings:   Marland Kitchen Marital Status:   Intimate Partner Violence:   . Fear of Current or Ex-Partner:   . Emotionally Abused:   Marland Kitchen Physically Abused:   . Sexually Abused:     Review of Systems  PHYSICAL EXAMINATION:    LMP 08/29/2019     General appearance: alert, cooperative and appears stated age Head: Normocephalic, without obvious abnormality, atraumatic Neck: no adenopathy, supple, symmetrical, trachea midline and thyroid normal to inspection and palpation Lungs: clear to auscultation bilaterally Breasts: normal appearance, no masses or tenderness, No nipple retraction or dimpling, No nipple discharge or bleeding, No axillary or supraclavicular adenopathy Heart: regular rate and rhythm Abdomen: soft, non-tender, no masses,  no organomegaly Extremities: extremities normal, atraumatic, no cyanosis or edema Skin: Skin color, texture, turgor normal. No rashes or lesions Lymph nodes: Cervical, supraclavicular, and axillary nodes normal. No abnormal inguinal nodes palpated Neurologic: Grossly normal  Pelvic: External genitalia:  no lesions              Urethra:  normal appearing urethra with no masses, tenderness or lesions              Bartholins and Skenes: normal                 Vagina: normal appearing vagina with normal color and discharge, no lesions              Cervix: no lesions                Bimanual Exam:  Uterus:  normal  size, contour, position, consistency, mobility, non-tender              Adnexa: no mass, fullness, tenderness  Rectal exam: {yes no:314532}.  Confirms.              Anus:  normal sphincter tone, no lesions  Chaperone was present for exam.  ASSESSMENT     PLAN     An After Visit Summary was printed and given to the patient.  ______ minutes face to face time of which over 50% was spent in counseling.

## 2019-09-10 ENCOUNTER — Ambulatory Visit: Payer: 59 | Admitting: Obstetrics and Gynecology

## 2019-09-13 ENCOUNTER — Encounter: Payer: Self-pay | Admitting: Obstetrics and Gynecology

## 2019-09-17 NOTE — Progress Notes (Signed)
GYNECOLOGY  VISIT   HPI: 34 y.o.   Single  African American  female   [place] with Patient's last menstrual period was 08/29/2019.   here to discuss CT scan results.    She does not have pain if she is not on her menstruation.  Considering options for care.  Prefers to keep her uterus.   CLINICAL DATA:  Left-sided abdominal and pelvic pain. Nausea and vomiting. Elevated bilirubin and lipase. Fibroids.  EXAM: CT ABDOMEN AND PELVIS WITH CONTRAST  TECHNIQUE: Multidetector CT imaging of the abdomen and pelvis was performed using the standard protocol following bolus administration of intravenous contrast.  CONTRAST:  162mL ISOVUE-300 IOPAMIDOL (ISOVUE-300) INJECTION 61%  COMPARISON:  10/31/2018  FINDINGS: Lower Chest: No acute findings.  Hepatobiliary: No hepatic masses identified. Focal fatty infiltration again seen adjacent to the falciform ligament. Gallbladder is unremarkable. No evidence of biliary ductal dilatation.  Pancreas:  No mass or inflammatory changes.  Spleen: Within normal limits in size and appearance.  Adrenals/Urinary Tract: No masses identified. No evidence of ureteral calculi or hydronephrosis.  Stomach/Bowel: No evidence of obstruction, inflammatory process or abnormal fluid collections. Normal appendix visualized.  Vascular/Lymphatic: No pathologically enlarged lymph nodes. No abdominal aortic aneurysm.  Reproductive: Several uterine fibroids are again seen and mildly increased in size since prior exam. Index fibroid in posterior uterine corpus measures 5.1 cm compared to 4.4 cm previously. No other pelvic mass identified. No fluid collections seen.  Other:  None.  Musculoskeletal:  No suspicious bone lesions identified.  IMPRESSION: 1. No acute findings within the abdomen or pelvis. 2. Several uterine fibroids, mildly increased in size.   Electronically Signed   By: Marlaine Hind M.D.   On: 09/06/2019 15:55     Patient received Depo Provera at her last office visit as a treatment for uterine fibroids.    GYNECOLOGIC HISTORY: Patient's last menstrual period was 08/29/2019. Contraception: Depo Provera Menopausal hormone therapy:  n/a Last mammogram:  n/a Last pap smear:   07-02-19 Neg:Neg HR HPV        OB History    Gravida  0   Para  0   Term  0   Preterm  0   AB  0   Living  0     SAB  0   TAB  0   Ectopic  0   Multiple  0   Live Births  0              Patient Active Problem List   Diagnosis Date Noted  . Vomiting 11/17/2016  . Hypokalemia 11/17/2016  . Cannabis hyperemesis syndrome concurrent with and due to cannabis abuse ([place]) 11/09/2016    Past Medical History:  Diagnosis Date  . Anemia 2021  . Asthma   . Cannabis hyperemesis syndrome concurrent with and due to cannabis abuse ([place]) 11/09/2016  . Elevated bilirubin 2021  . Elevated lipase 2021  . Fibroid   . Fibroids   . Low blood potassium 2021  . Migraines    menstrual migraine wiht aura    Past Surgical History:  Procedure Laterality Date  . broken hand Left    pins placed in left hand  . NO PAST SURGERIES      Current Outpatient Medications  Medication Sig Dispense Refill  . Acetaminophen-Caff-Pyrilamine (MIDOL COMPLETE PO) Take 2 tablets by mouth as needed (cramping/pain).    Marland Kitchen ibuprofen (ADVIL,MOTRIN) 200 MG tablet Take 400 mg by mouth every 8 (eight) hours as needed for moderate  pain.     . ondansetron (ZOFRAN ODT) 4 MG disintegrating tablet Take 1 tablet (4 mg total) by mouth every 8 (eight) hours as needed for nausea or vomiting. 20 tablet 0  . pantoprazole (PROTONIX) 20 MG tablet Take 1 tablet (20 mg total) by mouth daily. 30 tablet 0  . potassium chloride (KLOR-CON) 10 MEQ tablet Take 1 tablet (10 mEq total) by mouth 2 (two) times daily. 6 tablet 0  . promethazine (PHENERGAN) 25 MG tablet Take 1 tablet (25 mg total) by mouth every 6 (six) hours as needed for nausea or vomiting. 20  tablet 0  . sucralfate (CARAFATE) 1 g tablet Take 1 tablet (1 g total) by mouth 4 (four) times daily -  with meals and at bedtime. 30 tablet 0  . traMADol (ULTRAM) 50 MG tablet Take 1 tablet (50 mg total) by mouth every 6 (six) hours as needed. 15 tablet 0   No current facility-administered medications for this visit.     ALLERGIES: Shrimp [shellfish allergy] and Cabbage  Family History  Problem Relation Age of Onset  . Diabetes Mother   . Breast cancer Mother        Pt.thinks she may have had br.ca  . Hypertension Mother   . Diabetes Other   . Hypertension Maternal Grandmother     Social History   Socioeconomic History  . Marital status: Single    Spouse name: Not on file  . Number of children: Not on file  . Years of education: Not on file  . Highest education level: Not on file  Occupational History  . Not on file  Tobacco Use  . Smoking status: Former Smoker    Types: Cigarettes  . Smokeless tobacco: Never Used  . Tobacco comment: 11/17/2016 "someday smoker when I did smoke"  Substance and Sexual Activity  . Alcohol use: Yes    Comment: occassionly  . Drug use: Yes    Types: Marijuana    Comment: Hx of marijuana use  . Sexual activity: Never    Birth control/protection: None, Abstinence  Other Topics Concern  . Not on file  Social History Narrative  . Not on file   Social Determinants of Health   Financial Resource Strain:   . Difficulty of Paying Living Expenses:   Food Insecurity:   . Worried About Charity fundraiser in the Last Year:   . Arboriculturist in the Last Year:   Transportation Needs:   . Film/video editor (Medical):   Marland Kitchen Lack of Transportation (Non-Medical):   Physical Activity:   . Days of Exercise per Week:   . Minutes of Exercise per Session:   Stress:   . Feeling of Stress :   Social Connections:   . Frequency of Communication with Friends and Family:   . Frequency of Social Gatherings with Friends and Family:   . Attends  Religious Services:   . Active Member of Clubs or Organizations:   . Attends Archivist Meetings:   Marland Kitchen Marital Status:   Intimate Partner Violence:   . Fear of Current or Ex-Partner:   . Emotionally Abused:   Marland Kitchen Physically Abused:   . Sexually Abused:     Review of Systems  All other systems reviewed and are negative.   PHYSICAL EXAMINATION:    BP 126/80   Pulse 84   Temp 97.7 F (36.5 C) (Temporal)   Ht 5' 0.5" (1.537 m)   Wt 141 lb (64 kg)  LMP 08/29/2019   BMI 27.08 kg/m     General appearance: alert, cooperative and appears stated age   ASSESSMENT  Uterine fibroids.  Dysmenorrhea.  Pelvic pain.   PLAN  I instructed her to not take her Seasonale.  Return for Depo Provera 11/26/19 if wishes to continue with this.  We reviewed options for her fibroids - uterine artery embolization, myomectomy, hysterectomy.  I did mention Elagolix also.  She prefers the opportunity to maintain her uterus.  Will refer her to [facility], [place]. Questions invited and answered.   An After Visit Summary was printed and given to the patient.  __20____ minutes face to face time of which over 50% was spent in counseling.

## 2019-09-18 ENCOUNTER — Encounter: Payer: Self-pay | Admitting: Obstetrics and Gynecology

## 2019-09-18 ENCOUNTER — Other Ambulatory Visit: Payer: Self-pay

## 2019-09-18 ENCOUNTER — Telehealth: Payer: Self-pay | Admitting: Obstetrics and Gynecology

## 2019-09-18 ENCOUNTER — Ambulatory Visit (INDEPENDENT_AMBULATORY_CARE_PROVIDER_SITE_OTHER): Payer: 59 | Admitting: Obstetrics and Gynecology

## 2019-09-18 VITALS — BP 126/80 | HR 84 | Temp 97.7°F | Ht 60.5 in | Wt 141.0 lb

## 2019-09-18 DIAGNOSIS — D219 Benign neoplasm of connective and other soft tissue, unspecified: Secondary | ICD-10-CM

## 2019-09-18 DIAGNOSIS — R102 Pelvic and perineal pain: Secondary | ICD-10-CM

## 2019-09-18 DIAGNOSIS — R109 Unspecified abdominal pain: Secondary | ICD-10-CM

## 2019-09-18 NOTE — Telephone Encounter (Signed)
Ambulatory referral placed to pain clinic, OBGYN Dr. Elby Showers at [facility].   Routing to [employer]  for scheduling.

## 2019-09-18 NOTE — Telephone Encounter (Signed)
Please place referral to [facility], [place] for patient for uterine fibroids.   She would like to maintain her uterus.

## 2019-09-18 NOTE — Telephone Encounter (Signed)
Reviewed with Magdalene Patricia.  New referral placed.   Routing to Dr. Antony Blackbird.   Encounter closed.

## 2019-09-18 NOTE — Telephone Encounter (Signed)
-----   Message from Lucienne Minks sent at 09/18/2019 12:17 PM EDT ----- Regarding: referral Sharee Pimple,   I have called and got the process for referrals.   No form to fill out.... records must be sent first. Once received it takes 1 week for triage to get the doctors to review the records. They are current 2-3 months out for scheduling patients. They do offer virtual visits for new patient to get them established as a patient.   The [facility] pelvic pain clinic is no longer in [facility] they have moved to [address] [place], [place] [address].   [phone] ph- [phone] fax. Dr. Elby Showers has retired so can you change the referral to [facility]? Also can you tell me what records to send to them?   [employer]

## 2019-09-26 ENCOUNTER — Telehealth: Payer: Self-pay | Admitting: *Deleted

## 2019-09-26 NOTE — Telephone Encounter (Signed)
Fax received from [facility]. Patient was referred, patient has been contacted by their office multiple times to schedule, no return call.   Call to patient. Advised as seen above. Patient request to proceed with referral. Number provided to patient [phone]. Advised patient she will need to contact them directly to schedule.   Patient asking when her next depo-provera is due?  Depo-provera received in office on 09/03/19. Due 11/19/19 -12/03/19.  Patient verbalizes understanding and is agreeable.   Form to Dr. Quincy Simmonds to sign for scan.   Routing to provider for final review. Patient is agreeable to disposition. Will close encounter.  Cc: Magdalene Patricia

## 2019-10-09 ENCOUNTER — Ambulatory Visit: Payer: 59 | Admitting: Obstetrics and Gynecology

## 2019-10-11 ENCOUNTER — Emergency Department (HOSPITAL_COMMUNITY)
Admission: EM | Admit: 2019-10-11 | Discharge: 2019-10-11 | Discharge status: Against Medical Advice | Payer: 59 | Source: Home / Self Care

## 2019-10-11 ENCOUNTER — Encounter (HOSPITAL_COMMUNITY): Payer: Self-pay

## 2019-10-11 ENCOUNTER — Emergency Department (HOSPITAL_COMMUNITY)
Admission: EM | Admit: 2019-10-11 | Discharge: 2019-10-11 | Disposition: A | Discharge status: Home / Self Care | Payer: 59 | Source: Home / Self Care | Attending: Emergency Medicine | Admitting: Emergency Medicine

## 2019-10-11 ENCOUNTER — Encounter (HOSPITAL_COMMUNITY): Payer: Self-pay | Admitting: Emergency Medicine

## 2019-10-11 ENCOUNTER — Other Ambulatory Visit: Payer: Self-pay

## 2019-10-11 ENCOUNTER — Emergency Department (HOSPITAL_COMMUNITY)
Admission: EM | Admit: 2019-10-11 | Discharge: 2019-10-12 | Discharge status: Against Medical Advice | Payer: 59 | Source: Home / Self Care | Attending: Emergency Medicine | Admitting: Emergency Medicine

## 2019-10-11 ENCOUNTER — Emergency Department (HOSPITAL_COMMUNITY)
Admission: EM | Admit: 2019-10-11 | Discharge: 2019-10-11 | Disposition: A | Discharge status: Home / Self Care | Payer: 59 | Attending: Emergency Medicine | Admitting: Emergency Medicine

## 2019-10-11 DIAGNOSIS — R109 Unspecified abdominal pain: Secondary | ICD-10-CM | POA: Diagnosis present

## 2019-10-11 DIAGNOSIS — R1084 Generalized abdominal pain: Secondary | ICD-10-CM | POA: Insufficient documentation

## 2019-10-11 DIAGNOSIS — Z5321 Procedure and treatment not carried out due to patient leaving prior to being seen by health care provider: Secondary | ICD-10-CM | POA: Insufficient documentation

## 2019-10-11 DIAGNOSIS — R1030 Lower abdominal pain, unspecified: Secondary | ICD-10-CM

## 2019-10-11 DIAGNOSIS — R112 Nausea with vomiting, unspecified: Secondary | ICD-10-CM

## 2019-10-11 LAB — CBC
HCT: 34.8 % — ABNORMAL LOW (ref 36.0–46.0)
Hemoglobin: 11 g/dL — ABNORMAL LOW (ref 12.0–15.0)
MCH: 27.4 pg (ref 26.0–34.0)
MCHC: 31.6 g/dL (ref 30.0–36.0)
MCV: 86.8 fL (ref 80.0–100.0)
Platelets: 405 10*3/uL — ABNORMAL HIGH (ref 150–400)
RBC: 4.01 MIL/uL (ref 3.87–5.11)
RDW: 14.5 % (ref 11.5–15.5)
WBC: 10.5 10*3/uL (ref 4.0–10.5)
nRBC: 0 % (ref 0.0–0.2)

## 2019-10-11 LAB — COMPREHENSIVE METABOLIC PANEL WITH GFR
ALT: 27 U/L (ref 0–44)
AST: 35 U/L (ref 15–41)
Albumin: 4.7 g/dL (ref 3.5–5.0)
Alkaline Phosphatase: 52 U/L (ref 38–126)
Anion gap: 15 (ref 5–15)
BUN: 11 mg/dL (ref 6–20)
CO2: 18 mmol/L — ABNORMAL LOW (ref 22–32)
Calcium: 9.7 mg/dL (ref 8.9–10.3)
Chloride: 107 mmol/L (ref 98–111)
Creatinine, Ser: 0.73 mg/dL (ref 0.44–1.00)
GFR calc Af Amer: >60 mL/min
GFR calc non Af Amer: >60 mL/min
Glucose, Bld: 140 mg/dL — ABNORMAL HIGH (ref 70–99)
Potassium: 3.6 mmol/L (ref 3.5–5.1)
Sodium: 140 mmol/L (ref 135–145)
Total Bilirubin: 1.2 mg/dL (ref 0.3–1.2)
Total Protein: 8.7 g/dL — ABNORMAL HIGH (ref 6.5–8.1)

## 2019-10-11 LAB — LIPASE, BLOOD: Lipase: 22 U/L (ref 11–51)

## 2019-10-11 LAB — I-STAT BETA HCG BLOOD, ED (MC, WL, AP ONLY): I-stat hCG, quantitative: <5 m[IU]/mL

## 2019-10-11 MED ORDER — SODIUM CHLORIDE 0.9 % IV BOLUS
1000.0000 mL | Freq: Once | INTRAVENOUS | Status: AC
  Administered 2019-10-11: 1000 mL via INTRAVENOUS

## 2019-10-11 MED ORDER — KETOROLAC TROMETHAMINE 30 MG/ML IJ SOLN
30.0000 mg | Freq: Once | INTRAMUSCULAR | Status: AC
  Administered 2019-10-11: 30 mg via INTRAVENOUS
  Filled 2019-10-11: qty 1

## 2019-10-11 MED ORDER — ONDANSETRON 4 MG PO TBDP
4.0000 mg | ORAL_TABLET | Freq: Once | ORAL | Status: AC | PRN
  Administered 2019-10-11: 12:00:00 4 mg via ORAL
  Filled 2019-10-11: qty 1

## 2019-10-11 MED ORDER — METOCLOPRAMIDE HCL 5 MG/ML IJ SOLN
10.0000 mg | Freq: Once | INTRAMUSCULAR | Status: AC
  Administered 2019-10-11: 10 mg via INTRAVENOUS
  Filled 2019-10-11: qty 2

## 2019-10-11 NOTE — ED Notes (Signed)
Pt ambulatory from triage 

## 2019-10-11 NOTE — ED Provider Notes (Signed)
[place] DEPT Provider Note   CSN: [id number] Arrival date & time: 10/11/19  1618     History Chief Complaint  Patient presents with  . Abdominal Pain    Carrie Guerra is a 34 y.o. female.  The history is provided by the patient and medical records. No language interpreter was used.  Abdominal Pain Pain location:  Generalized Pain quality: aching and cramping   Pain radiates to:  Does not radiate Pain severity:  Severe Onset quality:  Gradual Duration:  1 week Timing:  Constant Progression:  Waxing and waning Chronicity:  Recurrent Relieved by:  Nothing Worsened by:  Palpation and movement Associated symptoms: diarrhea, nausea, vaginal bleeding and vomiting   Associated symptoms: no chest pain, no chills, no constipation, no cough, no dysuria, no fatigue, no fever, no shortness of breath and no vaginal discharge   Risk factors: not pregnant        Past Medical History:  Diagnosis Date  . Anemia 2021  . Asthma   . Cannabis hyperemesis syndrome concurrent with and due to cannabis abuse ([place]) 11/09/2016  . Elevated bilirubin 2021  . Elevated lipase 2021  . Fibroid   . Fibroids   . Low blood potassium 2021  . Migraines    menstrual migraine wiht aura    Patient Active Problem List   Diagnosis Date Noted  . Vomiting 11/17/2016  . Hypokalemia 11/17/2016  . Cannabis hyperemesis syndrome concurrent with and due to cannabis abuse ([place]) 11/09/2016    Past Surgical History:  Procedure Laterality Date  . broken hand Left    pins placed in left hand  . NO PAST SURGERIES       OB History    Gravida  0   Para  0   Term  0   Preterm  0   AB  0   Living  0     SAB  0   TAB  0   Ectopic  0   Multiple  0   Live Births  0           Family History  Problem Relation Age of Onset  . Diabetes Mother   . Breast cancer Mother        Pt.thinks she may have had br.ca  . Hypertension Mother   . Diabetes Other   .  Hypertension Maternal Grandmother     Social History   Tobacco Use  . Smoking status: Former Smoker    Types: Cigarettes  . Smokeless tobacco: Never Used  . Tobacco comment: 11/17/2016 "someday smoker when I did smoke"  Substance Use Topics  . Alcohol use: Yes    Comment: occassionly  . Drug use: Yes    Types: Marijuana    Comment: Hx of marijuana use    Home Medications Prior to Admission medications   Medication Sig Start Date End Date Taking? Authorizing Provider  Acetaminophen-Caff-Pyrilamine (MIDOL COMPLETE PO) Take 2 tablets by mouth as needed (cramping/pain).    [provider]  ibuprofen (ADVIL,MOTRIN) 200 MG tablet Take 400 mg by mouth every 8 (eight) hours as needed for moderate pain.     [provider]  ondansetron (ZOFRAN ODT) 4 MG disintegrating tablet Take 1 tablet (4 mg total) by mouth every 8 (eight) hours as needed for nausea or vomiting. 09/02/19   Tedd Sias, PA  pantoprazole (PROTONIX) 20 MG tablet Take 1 tablet (20 mg total) by mouth daily. 06/22/19   Muthersbaugh, [name], PA-C  potassium chloride (KLOR-CON) 10 MEQ tablet Take 1 tablet (10 mEq total) by mouth 2 (two) times daily. 08/31/19   Rancour, Annie Main, MD  promethazine (PHENERGAN) 25 MG tablet Take 1 tablet (25 mg total) by mouth every 6 (six) hours as needed for nausea or vomiting. 08/31/19   Rancour, Annie Main, MD  sucralfate (CARAFATE) 1 g tablet Take 1 tablet (1 g total) by mouth 4 (four) times daily -  with meals and at bedtime. 06/22/19   Muthersbaugh, [name], PA-C  traMADol (ULTRAM) 50 MG tablet Take 1 tablet (50 mg total) by mouth every 6 (six) hours as needed. 09/03/19   Nunzio Cobbs, MD  prochlorperazine (COMPAZINE) 25 MG suppository Place 1 suppository (25 mg total) rectally every 12 (twelve) hours as needed for nausea or vomiting. Patient not taking: Reported on 04/04/2017 11/08/16 06/22/19  Lorin Glass, PA-C    Allergies    Shrimp [shellfish allergy] and  Cabbage  Review of Systems   Review of Systems  Constitutional: Negative for chills, diaphoresis, fatigue and fever.  HENT: Negative for congestion.   Respiratory: Negative for cough, chest tightness, shortness of breath and wheezing.   Cardiovascular: Negative for chest pain and leg swelling.  Gastrointestinal: Positive for abdominal pain, diarrhea, nausea and vomiting. Negative for abdominal distention and constipation.  Genitourinary: Positive for menstrual problem and vaginal bleeding. Negative for decreased urine volume, dysuria, flank pain, frequency, pelvic pain, vaginal discharge and vaginal pain.  Musculoskeletal: Positive for back pain. Negative for neck pain and neck stiffness.  Neurological: Negative for headaches.  Psychiatric/Behavioral: Positive for agitation.  All other systems reviewed and are negative.   Physical Exam Updated Vital Signs BP (!) 165/99 (BP Location: Right Arm)   Pulse 74   Temp 97.6 F (36.4 C) (Oral)   Resp 16   SpO2 100%   Physical Exam Vitals and nursing note reviewed.  Constitutional:      General: She is not in acute distress.    Appearance: She is well-developed. She is not ill-appearing, toxic-appearing or diaphoretic.  HENT:     Head: Normocephalic and atraumatic.     Right Ear: External ear normal.     Left Ear: External ear normal.     Nose: Nose normal.     Mouth/Throat:     Pharynx: No oropharyngeal exudate.  Eyes:     Conjunctiva/sclera: Conjunctivae normal.     Pupils: Pupils are equal, round, and reactive to light.  Cardiovascular:     Rate and Rhythm: Normal rate.     Heart sounds: Normal heart sounds. No murmur.  Pulmonary:     Effort: No respiratory distress.     Breath sounds: No stridor.  Abdominal:     General: Bowel sounds are normal. There is no distension.     Tenderness: There is abdominal tenderness. There is no right CVA tenderness, left CVA tenderness, guarding or rebound.  Genitourinary:    Comments:  Refused pelvic exam today Musculoskeletal:     Cervical back: Normal range of motion and neck supple.  Skin:    General: Skin is warm.     Capillary Refill: Capillary refill takes less than 2 seconds.     Findings: No erythema or rash.  Neurological:     General: No focal deficit present.     Mental Status: She is alert.     Motor: No abnormal muscle tone.     Coordination: Coordination normal.     Deep Tendon Reflexes: Reflexes are normal  and symmetric.  Psychiatric:        Mood and Affect: Mood is anxious.     ED Results / Procedures / Treatments   Labs (all labs ordered are listed, but only abnormal results are displayed) Labs Reviewed  COMPREHENSIVE METABOLIC PANEL - Abnormal; Notable for the following components:      Result Value   CO2 18 (*)    Glucose, Bld 140 (*)    Total Protein 8.7 (*)    All other components within normal limits  CBC - Abnormal; Notable for the following components:   Hemoglobin 11.0 (*)    HCT 34.8 (*)    Platelets 405 (*)    All other components within normal limits  LIPASE, BLOOD  URINALYSIS, ROUTINE W REFLEX MICROSCOPIC  I-STAT BETA HCG BLOOD, ED ([facility], [facility], [facility] ONLY)    EKG None  Radiology No results found.  Procedures Procedures (including critical care time)  Medications Ordered in ED Medications  ketorolac (TORADOL) 30 MG/ML injection 30 mg (has no administration in time range)  metoCLOPramide (REGLAN) injection 10 mg (has no administration in time range)  sodium chloride 0.9 % bolus 1,000 mL (1,000 mLs Intravenous New Bag/Given 10/11/19 1839)    ED Course  I have reviewed the triage vital signs and the nursing notes.  Pertinent labs & imaging results that were available during my care of the patient were reviewed by me and considered in my medical decision making (see chart for details).    MDM Rules/Calculators/A&P                      Carrie Guerra is a 34 y.o. female with a past medical history significant for monthly  severe uterine fibroid pain, asthma, migraines, and prior cannabis hyperemesis syndrome who presents with abdominal pain nausea and vomiting.  Patient reports that she has had terrible uterine fibroids for years and has been managed at [facility] for them.  She says that she is going to be seeing them for uterine removal in the near future but as she is near the end of her menstrual cycle, the pain is uncontrolled.  She says that she has had no recent trauma and denies fevers or chills.  She reports nausea and vomiting has not had much to eat or drink this week.  She reports she is having vaginal bleeding consistent with the menstrual cycle but denies any vaginal discharge.  She denies any STI exposure.  She reports no fevers, chills, chest pain, shortness of breath.  She says her symptoms are completely consistent with her uterine fibroids.  Patient came to this emergency department this morning but due to the weight left and went to [facility].  Patient then left due to the wait and came back here.  Patient says that her pain is a 10 out of 10 and she is having nausea and vomiting including vomiting into the sink in the exam room.  She is screaming and hunched over and pacing around the room.  On exam, abdomen is diffusely tender but bowel sounds were appreciated and sounded normal.  No significant CVA tenderness but she did have some low back tenderness which she reports is consistent with prior.  Normal gait and normal strength and sensation in extremities.  Lungs clear and chest is nontender.  No murmur.  Patient tearful.  Had a shared decision made conversation with patient.  We discussed getting repeat imaging with either ultrasound or CT but she is not  interested in this.  She says that her symptoms are "the exact same" as all of her other fibroid pain episodes.  We discussed that we could be missing something like a ovarian torsion or other acute change but she does not want any ultrasound at this time.   She also does not want a pelvic exam today.  We discussed medication she has had in the past and she says that the Toradol and Reglan that she received during her last visit helped her the most.  We will give her these medications and give her some fluids for rehydration given the nausea and vomiting.  She has screening labs in triage that showed some mild dehydration with a low CO2 but otherwise had reassuring electrolytes and kidney function.  Liver function and lipase normal.  She is not pregnant.  Mild anemia but no leukocytosis.  Overall reassuring.  Anticipate reassessment after her rehydration and medications.  Anticipate she will need to follow-up with her outpatient OB/GYN team.  8:12 PM Patient felt much better after medications and fluids.  She would like to go home now.  Patient reports she has adequate nausea medicine at home and will call her [facility] OB[facility] team tomorrow to discuss further management.  She understands return precautions and instructions to stay hydrated.  She had no other questions or concerns and was discharged in good condition with significant improved symptoms.    Final Clinical Impression(s) / ED Diagnoses Final diagnoses:  Lower abdominal pain  Non-intractable vomiting with nausea, unspecified vomiting type    Rx / DC Orders ED Discharge Orders    None     Clinical Impression: 1. Lower abdominal pain   2. Non-intractable vomiting with nausea, unspecified vomiting type     Disposition: Discharge  Condition: Good  I have discussed the results, Dx and Tx plan with the pt(& family if present). He/she/they expressed understanding and agree(s) with the plan. Discharge instructions discussed at great length. Strict return precautions discussed and pt &/or family have verbalized understanding of the instructions. No further questions at time of discharge.    New Prescriptions   No medications on file    Follow Up: Your [facility] team     [place] DEPT [place] [id number] Langdon Desha, Gwenyth Allegra, MD 10/11/19 2014

## 2019-10-11 NOTE — ED Notes (Signed)
Patient was yelling and screaming on arrival.  She turned on call light I went in to assist patient  And ask her how may I help her and she was yelling and screaming at me saying we were rude and she was in pain.  I tired to calm patient and she continue to yell and curse at me I left the room because she would not stop yelling and curse.

## 2019-10-11 NOTE — ED Notes (Signed)
Registration came into triage and informed us that pt was laying in the floor and saying that she felt like She was going to pass out. Pt currently yelling very loudly at this time.

## 2019-10-11 NOTE — Discharge Instructions (Addendum)
Your history and exam today is consistent recurrent with pain from your known uterine fibroids during your menstrual cycle.  Your blood work was overall reassuring and consistent with some dehydration.  We offered repeat pelvic exam and imaging but given the similarity to prior, you did not want this today.  As your symptoms have improved after the medications, we feel you are safe for discharge home.  Please follow-up with your OB/GYN team at [facility] for further management.  Please use your nausea medicine at home and stay hydrated.  If any symptoms change or worsen acutely, please return to the nearest emergency department.

## 2019-10-11 NOTE — ED Triage Notes (Signed)
Patient crying in triage c/o lower left abdominal pain r/t her fibroids. Episode of emesis during triage, zofran PO given.

## 2019-10-11 NOTE — ED Notes (Signed)
Writer went in to triage the patient. Patient came out of the room yelling and talking on the phone. Patient told the person on the phone, "I am leaving this hospital. I am going to go to another hospital. Patient continued to talk to that p[erson and walked out of the triage area.

## 2019-10-11 NOTE — ED Notes (Signed)
Pt sitting on floor in lobby.

## 2019-10-11 NOTE — ED Triage Notes (Signed)
Per EMS-abdominal pain due to her fibroids-pain started this am-patient screaming in pain

## 2019-10-11 NOTE — ED Triage Notes (Signed)
Per EMS-LLQ pain due to fibroids-patient was here with same symptoms this am-patient was verbally abusive to staff and LWBS

## 2019-10-11 NOTE — ED Triage Notes (Signed)
Pt seen 3X today for same. States she took 2 of her tramadol rx at 2130 tonight and it did not help.

## 2019-10-11 NOTE — ED Notes (Signed)
Pt lwbs 

## 2019-10-11 NOTE — ED Notes (Signed)
PT states she is ready to go home and is feeling better. Dr. Sherry Ruffing made aware.

## 2019-10-12 NOTE — ED Notes (Signed)
Pt not present when called for room 3x.

## 2019-10-12 NOTE — ED Notes (Signed)
Pt seen by the screener on the phone and leaving the lobby

## 2019-12-28 ENCOUNTER — Other Ambulatory Visit: Payer: Self-pay

## 2019-12-28 ENCOUNTER — Emergency Department (HOSPITAL_COMMUNITY): Payer: 59

## 2019-12-28 ENCOUNTER — Emergency Department (HOSPITAL_COMMUNITY)
Admission: EM | Admit: 2019-12-28 | Discharge: 2019-12-28 | Disposition: A | Discharge status: Home / Self Care | Payer: 59 | Attending: Emergency Medicine | Admitting: Emergency Medicine

## 2019-12-28 DIAGNOSIS — Z87891 Personal history of nicotine dependence: Secondary | ICD-10-CM | POA: Diagnosis not present

## 2019-12-28 DIAGNOSIS — G8918 Other acute postprocedural pain: Secondary | ICD-10-CM | POA: Diagnosis not present

## 2019-12-28 DIAGNOSIS — R310 Gross hematuria: Secondary | ICD-10-CM | POA: Insufficient documentation

## 2019-12-28 DIAGNOSIS — R109 Unspecified abdominal pain: Secondary | ICD-10-CM | POA: Diagnosis not present

## 2019-12-28 DIAGNOSIS — J45909 Unspecified asthma, uncomplicated: Secondary | ICD-10-CM | POA: Diagnosis not present

## 2019-12-28 LAB — CBC WITH DIFFERENTIAL/PLATELET
Abs Immature Granulocytes: 0.03 10*3/uL (ref 0.00–0.07)
Basophils Absolute: 0 10*3/uL (ref 0.0–0.1)
Basophils Relative: 0 %
Eosinophils Absolute: 0 10*3/uL (ref 0.0–0.5)
Eosinophils Relative: 0 %
HCT: 33.7 % — ABNORMAL LOW (ref 36.0–46.0)
Hemoglobin: 10.7 g/dL — ABNORMAL LOW (ref 12.0–15.0)
Immature Granulocytes: 0 %
Lymphocytes Relative: 16 %
Lymphs Abs: 1.4 10*3/uL (ref 0.7–4.0)
MCH: 27.9 pg (ref 26.0–34.0)
MCHC: 31.8 g/dL (ref 30.0–36.0)
MCV: 88 fL (ref 80.0–100.0)
Monocytes Absolute: 0.7 10*3/uL (ref 0.1–1.0)
Monocytes Relative: 8 %
Neutro Abs: 6.6 10*3/uL (ref 1.7–7.7)
Neutrophils Relative %: 76 %
Platelets: 315 10*3/uL (ref 150–400)
RBC: 3.83 MIL/uL — ABNORMAL LOW (ref 3.87–5.11)
RDW: 14.5 % (ref 11.5–15.5)
WBC: 8.7 10*3/uL (ref 4.0–10.5)
nRBC: 0 % (ref 0.0–0.2)

## 2019-12-28 LAB — URINALYSIS, ROUTINE W REFLEX MICROSCOPIC
Bacteria, UA: NONE SEEN
Bilirubin Urine: NEGATIVE
Glucose, UA: NEGATIVE mg/dL
Ketones, ur: 80 mg/dL — AB
Leukocytes,Ua: NEGATIVE
Nitrite: NEGATIVE
Protein, ur: 30 mg/dL — AB
Specific Gravity, Urine: 1.028 (ref 1.005–1.030)
pH: 5 (ref 5.0–8.0)

## 2019-12-28 LAB — COMPREHENSIVE METABOLIC PANEL
ALT: 29 U/L (ref 0–44)
AST: 55 U/L — ABNORMAL HIGH (ref 15–41)
Albumin: 4 g/dL (ref 3.5–5.0)
Alkaline Phosphatase: 38 U/L (ref 38–126)
Anion gap: 9 (ref 5–15)
BUN: 11 mg/dL (ref 6–20)
CO2: 25 mmol/L (ref 22–32)
Calcium: 9.1 mg/dL (ref 8.9–10.3)
Chloride: 103 mmol/L (ref 98–111)
Creatinine, Ser: 0.78 mg/dL (ref 0.44–1.00)
GFR calc Af Amer: >60 mL/min (ref >60)
GFR calc non Af Amer: >60 mL/min (ref >60)
Glucose, Bld: 96 mg/dL (ref 70–99)
Potassium: 4 mmol/L (ref 3.5–5.1)
Sodium: 137 mmol/L (ref 135–145)
Total Bilirubin: 1.1 mg/dL (ref 0.3–1.2)
Total Protein: 8.3 g/dL — ABNORMAL HIGH (ref 6.5–8.1)

## 2019-12-28 LAB — LIPASE, BLOOD: Lipase: 65 U/L — ABNORMAL HIGH (ref 11–51)

## 2019-12-28 MED ORDER — IOHEXOL 9 MG/ML PO SOLN
ORAL | Status: AC
  Filled 2019-12-28: qty 1000

## 2019-12-28 MED ORDER — HYDROMORPHONE HCL 1 MG/ML IJ SOLN
1.0000 mg | Freq: Once | INTRAMUSCULAR | Status: AC
  Administered 2019-12-28: 1 mg via INTRAVENOUS
  Filled 2019-12-28: qty 1

## 2019-12-28 MED ORDER — MORPHINE SULFATE (PF) 4 MG/ML IV SOLN
4.0000 mg | Freq: Once | INTRAVENOUS | Status: DC
  Filled 2019-12-28: qty 1

## 2019-12-28 MED ORDER — ONDANSETRON 4 MG PO TBDP
4.0000 mg | ORAL_TABLET | Freq: Three times a day (TID) | ORAL | 0 refills | Status: DC | PRN
Start: 2019-12-28 — End: 2020-06-15

## 2019-12-28 MED ORDER — SODIUM CHLORIDE (PF) 0.9 % IJ SOLN
INTRAMUSCULAR | Status: AC
  Filled 2019-12-28: qty 50

## 2019-12-28 MED ORDER — IOHEXOL 300 MG/ML  SOLN
100.0000 mL | Freq: Once | INTRAMUSCULAR | Status: AC | PRN
  Administered 2019-12-28: 100 mL via INTRAVENOUS

## 2019-12-28 MED ORDER — ONDANSETRON HCL 4 MG/2ML IJ SOLN
4.0000 mg | Freq: Once | INTRAMUSCULAR | Status: AC
  Administered 2019-12-28: 4 mg via INTRAVENOUS
  Filled 2019-12-28: qty 2

## 2019-12-28 MED ORDER — CEPHALEXIN 500 MG PO CAPS: ORAL_CAPSULE | ORAL | 0 refills | Status: DC

## 2019-12-28 NOTE — ED Triage Notes (Signed)
Pt BIBA from home-   Per EMS-  Pt had laparoscopic fibroid surgery on Thursday, c/o worsening abdominal pain, worse with movement.   T 100.6 BP 148/104

## 2019-12-28 NOTE — Discharge Instructions (Addendum)
You have been evaluated for your abdominal pain.  Fortunately your CT scan did not show any concerning changes.  Since there are redness to your skin at the incision site, take antibiotic prescribed to cover for potential skin infection.  Take antinausea medication as needed.  Call and follow-up closely with your surgeon for further care.  Take laxative to help with bowel movement.  Return if you have any concern.  Evidence of blood in your urine.  It has been present during the last few examinations, therefore please follow-up with [facility] for further evaluation of your recurrent blood in urine.

## 2019-12-28 NOTE — ED Provider Notes (Signed)
[facility] DEPT Provider Note   CSN: [id number] Arrival date & time: 12/28/19  1113     History Chief Complaint  Patient presents with  . Abdominal Pain    Carrie Guerra is a 34 y.o. female.  The history is provided by the patient and medical records. No language interpreter was used.       34 year old with history of uterine fibroid recently had a laparoscopic myomectomy done 2 days ago at [facility] is presenting with abdominal pain.  Patient reports last night around 4 AM she developed acute onset of pain throughout her abdomen.  She described pain more significant to her right lower quadrant radiates towards her groin.  Pain is constant, severe, 10 out of 10 and described as a squeezing sensation.  Pain still seems to help with the pain but any movement makes the pain worse.  She tried taking her oxycodone and ibuprofen prior to arrival with minimal improvement.  She does endorse some burning on urination but denies any strong urine odor.  She has noticed some blood in the urine.  She also report not having a bowel movement for the past several days.  She does not complain of chest pain or shortness of breath    Laparoscopic myomectomy 2 days ago at [facility].  Pt report last night at 4am felt sudden onset of RLQ pain radiates to groin, constant, severe, 10/10 and described as squeezing sensation.  Laying improves but movement worse.  Took oxycodone and ibuprofen PTA with minimal improvement.  Burning with urination but not malodorous.  Has noticed hematuria.  Had not had a BM for several days.    Diffused pain with palpation of the abd.    Urine culture, blood culture?, [place] abd/pelvis, CBC, CMP  UA  Past Medical History:  Diagnosis Date  . Anemia 2021  . Asthma   . Cannabis hyperemesis syndrome concurrent with and due to cannabis abuse ([place]) 11/09/2016  . Elevated bilirubin 2021  . Elevated lipase 2021  . Fibroid   . Fibroids   . Low blood  potassium 2021  . Migraines    menstrual migraine wiht aura    Patient Active Problem List   Diagnosis Date Noted  . Vomiting 11/17/2016  . Hypokalemia 11/17/2016  . Cannabis hyperemesis syndrome concurrent with and due to cannabis abuse ([place]) 11/09/2016    Past Surgical History:  Procedure Laterality Date  . broken hand Left    pins placed in left hand  . NO PAST SURGERIES       OB History    Gravida  0   Para  0   Term  0   Preterm  0   AB  0   Living  0     SAB  0   TAB  0   Ectopic  0   Multiple  0   Live Births  0           Family History  Problem Relation Age of Onset  . Diabetes Mother   . Breast cancer Mother        Pt.thinks she may have had br.ca  . Hypertension Mother   . Diabetes Other   . Hypertension Maternal Grandmother     Social History   Tobacco Use  . Smoking status: Former Smoker    Types: Cigarettes  . Smokeless tobacco: Never Used  . Tobacco comment: 11/17/2016 "someday smoker when I did smoke"  Vaping Use  .  Vaping Use: Some days  Substance Use Topics  . Alcohol use: Yes    Comment: occassionly  . Drug use: Yes    Types: Marijuana    Comment: Hx of marijuana use    Home Medications Prior to Admission medications   Medication Sig Start Date End Date Taking? Authorizing Provider  Acetaminophen-Caff-Pyrilamine (MIDOL COMPLETE PO) Take 2 tablets by mouth as needed (cramping/pain).    [provider]  ibuprofen (ADVIL,MOTRIN) 200 MG tablet Take 400 mg by mouth every 8 (eight) hours as needed for moderate pain.     [provider]  ondansetron (ZOFRAN ODT) 4 MG disintegrating tablet Take 1 tablet (4 mg total) by mouth every 8 (eight) hours as needed for nausea or vomiting. 09/02/19   Tedd Sias, PA  pantoprazole (PROTONIX) 20 MG tablet Take 1 tablet (20 mg total) by mouth daily. 06/22/19   Muthersbaugh, [name], PA-C  potassium chloride (KLOR-CON) 10 MEQ tablet Take 1 tablet (10 mEq total) by mouth  2 (two) times daily. 08/31/19   Rancour, Annie Main, MD  promethazine (PHENERGAN) 25 MG tablet Take 1 tablet (25 mg total) by mouth every 6 (six) hours as needed for nausea or vomiting. 08/31/19   Rancour, Annie Main, MD  sucralfate (CARAFATE) 1 g tablet Take 1 tablet (1 g total) by mouth 4 (four) times daily -  with meals and at bedtime. 06/22/19   Muthersbaugh, [name], PA-C  traMADol (ULTRAM) 50 MG tablet Take 1 tablet (50 mg total) by mouth every 6 (six) hours as needed. 09/03/19   Nunzio Cobbs, MD  prochlorperazine (COMPAZINE) 25 MG suppository Place 1 suppository (25 mg total) rectally every 12 (twelve) hours as needed for nausea or vomiting. Patient not taking: Reported on 04/04/2017 11/08/16 06/22/19  Lorin Glass, PA-C    Allergies    Shrimp [shellfish allergy] and Cabbage  Review of Systems   Review of Systems  All other systems reviewed and are negative.   Physical Exam Updated Vital Signs BP (!) 146/93 (BP Location: Right Arm)   Pulse 66   Temp 98.3 F (36.8 C) (Oral)   Resp 20   Physical Exam Vitals and nursing note reviewed.  Constitutional:      Appearance: She is well-developed.     Comments: Patient laying in bed appears uncomfortable, actively moaning.  HENT:     Head: Atraumatic.  Eyes:     Conjunctiva/sclera: Conjunctivae normal.  Cardiovascular:     Rate and Rhythm: Normal rate and regular rhythm.     Heart sounds: Normal heart sounds.  Pulmonary:     Effort: Pulmonary effort is normal.     Breath sounds: Normal breath sounds. No wheezing, rhonchi or rales.  Abdominal:     General: Abdomen is flat. Bowel sounds are increased.     Palpations: Abdomen is soft.     Tenderness: There is generalized abdominal tenderness (Diffuse abdominal tenderness on gentle palpation with guarding.  Several laparoscopic surgical incision site with mild erythema noted to the umbilical incision site).     Hernia: No hernia is present.  Musculoskeletal:     Cervical  back: Neck supple.  Skin:    Findings: No rash.  Neurological:     Mental Status: She is alert and oriented to person, place, and time.  Psychiatric:        Mood and Affect: Mood normal.     ED Results / Procedures / Treatments   Labs (all labs ordered are listed, but only  abnormal results are displayed) Labs Reviewed  CBC WITH DIFFERENTIAL/PLATELET - Abnormal; Notable for the following components:      Result Value   RBC 3.83 (*)    Hemoglobin 10.7 (*)    HCT 33.7 (*)    All other components within normal limits  COMPREHENSIVE METABOLIC PANEL - Abnormal; Notable for the following components:   Total Protein 8.3 (*)    AST 55 (*)    All other components within normal limits  LIPASE, BLOOD - Abnormal; Notable for the following components:   Lipase 65 (*)    All other components within normal limits  URINALYSIS, ROUTINE W REFLEX MICROSCOPIC - Abnormal; Notable for the following components:   APPearance HAZY (*)    Hgb urine dipstick MODERATE (*)    Ketones, ur 80 (*)    Protein, ur 30 (*)    All other components within normal limits  POC URINE PREG, ED    EKG None  Radiology CT ABDOMEN PELVIS W CONTRAST  Result Date: 12/28/2019 CLINICAL DATA:  Abdomen pain. Patient had laparoscopic fibroid surgery last Thursday. EXAM: CT ABDOMEN AND PELVIS WITH CONTRAST TECHNIQUE: Multidetector CT imaging of the abdomen and pelvis was performed using the standard protocol following bolus administration of intravenous contrast. CONTRAST:  190mL OMNIPAQUE IOHEXOL 300 MG/ML  SOLN COMPARISON:  September 06, 2019 FINDINGS: Lower chest: No acute abnormality. Hepatobiliary: No focal liver abnormality is seen. No gallstones, gallbladder wall thickening, or biliary dilatation. Pancreas: Unremarkable. No pancreatic ductal dilatation or surrounding inflammatory changes. Spleen: Normal in size without focal abnormality. Adrenals/Urinary Tract: Adrenal glands are unremarkable. Kidneys are normal, without renal  calculi, focal lesion, or hydronephrosis. Bladder is unremarkable. Stomach/Bowel: Stomach is within normal limits. Appendix appears normal. No evidence of bowel wall thickening, distention, or inflammatory changes. Vascular/Lymphatic: No significant vascular findings are present. No enlarged abdominal or pelvic lymph nodes. Reproductive: Status post hysterectomy. No adnexal masses. Other: Minimal free air is identified in the pelvis, in the umbilicus, in the anterior abdominal wall fascia and musculature consistent with recent laparoscopic changes. Musculoskeletal: No acute or significant osseous findings. IMPRESSION: 1. Minimal free air is identified in the pelvis, in the umbilicus, in the anterior abdominal wall fascia and musculature consistent with recent laparoscopic changes. 2. No acute abnormality identified in the abdomen and pelvis. Electronically Signed   By: Abelardo Diesel M.D.   On: 12/28/2019 14:43    Procedures Procedures (including critical care time)  Medications Ordered in ED Medications  morphine 4 MG/ML injection 4 mg (0 mg Intravenous Hold 12/28/19 1215)  iohexol (OMNIPAQUE) 9 MG/ML oral solution (has no administration in time range)  sodium chloride (PF) 0.9 % injection (has no administration in time range)  HYDROmorphone (DILAUDID) injection 1 mg (1 mg Intravenous Given 12/28/19 1210)  ondansetron (ZOFRAN) injection 4 mg (4 mg Intravenous Given 12/28/19 1211)  iohexol (OMNIPAQUE) 300 MG/ML solution 100 mL (100 mLs Intravenous Contrast Given 12/28/19 1417)    ED Course  I have reviewed the triage vital signs and the nursing notes.  Pertinent labs & imaging results that were available during my care of the patient were reviewed by me and considered in my medical decision making (see chart for details).    MDM Rules/Calculators/A&P                          BP (!) 146/93 (BP Location: Right Arm)   Pulse 66   Temp 98.3 F (36.8 C) (Oral)  Resp 20   SpO2 100%   Final Clinical  Impression(s) / ED Diagnoses Final diagnoses:  Postoperative abdominal pain  Gross hematuria    Rx / DC Orders ED Discharge Orders         Ordered    cephALEXin (KEFLEX) 500 MG capsule     Discontinue  Reprint     12/28/19 1546    ondansetron (ZOFRAN ODT) 4 MG disintegrating tablet  Every 8 hours PRN     Discontinue  Reprint     12/28/19 1546         3:01 PM Patient recently had a hysterectomy performed 2 days ago here with acute onset of diffuse abdominal tenderness.  Abdominal exam reveals diffuse tenderness with guarding.  Mild erythema noted to laparoscopic umbilical surgical site.  Labs are mostly reassuring, mild elevated lipase of 65, UA did show some moderate hemoglobin on urine dipsticks and 80 ketones. Abdominal pelvis CT scan demonstrate minimal free air in the pelvis consistent with recent upper complex change but no other acute abnormalities were identified.  I discussed finding with patient and recommend patient to follow-up closely with her surgeon for further managements of her condition.  Encourage patient to use laxative recently prescribed to help her with her bowel movement.  Will prescribe antinausea medication.  Since patient has blood in the urine of unknown etiology, encourage patient to follow-up with urologist outpatient for further care.  Since patient does have some mild erythema at her laparoscopic incision site, will also prescribe Keflex to cover for potential cellulitis.   Domenic Moras, PA-C 12/28/19 1548    Lacretia Leigh, MD 12/29/19 [phone]

## 2019-12-28 NOTE — ED Notes (Signed)
Discharge paperwork reviewed with pt, including prescriptions.  Pt wheeled to ED entrance, as requested by pt.  No other questions or concerns voiced by pt prior to departure.

## 2020-02-07 ENCOUNTER — Telehealth: Payer: Self-pay | Admitting: Obstetrics and Gynecology

## 2020-02-07 NOTE — Telephone Encounter (Signed)
Patient was referred to another physician for Myomectomy and had to leave work today due to pain. Carrie Guerra lives an hour away and would really to see Dr. Quincy Simmonds this morning if possible?

## 2020-02-07 NOTE — Telephone Encounter (Signed)
I do agree with evaluation for Covid infection of other etiology for her symptoms.   You may close this encounter.

## 2020-02-07 NOTE — Telephone Encounter (Signed)
Spoke with patient. Patient is s/p TLH, bilateral salpingectomy, right ovarian cystectomy and cystoscopy at [facility] 12/26/19. Patient reports she has been waking up with "morning sickness" and pelvic pain for the past 1-2 wks. Returned to work on 8/12. Patient reports nausea, vomiting and diarrhea x2 days. Was put on light duty at work, left work this morning due to pain and not feeling well. Patient states pain is not the same as she had been experiencing with her menses, pain is different. Denies fever/chills. Reports no appetite, is drinking fluids has been eating soup. Patient requesting OV with Dr. Quincy Simmonds. States she has been unable to get in contact with [facility] providers despite multiple calls and messages. Confirmed number on file for [facility] [phone], confirmed this is the number the patient has called, no return calls from provider office per patient. Patient states she she has left work and she will need to be seen by a provider for evaluation. Advised patient due to N/V and diarrhea and current Covid 19 policy, she would need to be seen by PCP/ER/Urgent Care for evaluation of symptoms. Advised patient she will also need to f/u with [facility] provider for further evaluation of ongoing pelvic pain after surgery. Patient expressed concern about not being able to get in contact with [facility] provider.  Patient states I do not have Covid and ended the call.   Routing to Dr. Quincy Simmonds for final review.

## 2020-02-07 NOTE — Telephone Encounter (Signed)
Encounter closed

## 2020-06-04 ENCOUNTER — Encounter (HOSPITAL_COMMUNITY): Payer: Self-pay

## 2020-06-04 ENCOUNTER — Emergency Department (HOSPITAL_COMMUNITY)
Admission: EM | Admit: 2020-06-04 | Discharge: 2020-06-04 | Discharge status: Against Medical Advice | Payer: 59 | Attending: Emergency Medicine | Admitting: Emergency Medicine

## 2020-06-04 ENCOUNTER — Other Ambulatory Visit: Payer: Self-pay

## 2020-06-04 DIAGNOSIS — Z5320 Procedure and treatment not carried out because of patient's decision for unspecified reasons: Secondary | ICD-10-CM | POA: Insufficient documentation

## 2020-06-04 DIAGNOSIS — J45909 Unspecified asthma, uncomplicated: Secondary | ICD-10-CM | POA: Insufficient documentation

## 2020-06-04 DIAGNOSIS — Z87891 Personal history of nicotine dependence: Secondary | ICD-10-CM | POA: Insufficient documentation

## 2020-06-04 DIAGNOSIS — R197 Diarrhea, unspecified: Secondary | ICD-10-CM | POA: Insufficient documentation

## 2020-06-04 DIAGNOSIS — R109 Unspecified abdominal pain: Secondary | ICD-10-CM | POA: Insufficient documentation

## 2020-06-04 DIAGNOSIS — R1110 Vomiting, unspecified: Secondary | ICD-10-CM | POA: Diagnosis not present

## 2020-06-04 MED ORDER — SODIUM CHLORIDE 0.9 % IV BOLUS: 1000.0000 mL | Freq: Once | INTRAVENOUS | Status: DC

## 2020-06-04 MED ORDER — METOCLOPRAMIDE HCL 5 MG/ML IJ SOLN
5.0000 mg | Freq: Once | INTRAMUSCULAR | Status: DC
  Filled 2020-06-04: qty 2

## 2020-06-04 MED ORDER — SODIUM CHLORIDE 0.9 % IV SOLN: INTRAVENOUS | Status: DC

## 2020-06-04 MED ORDER — ONDANSETRON 4 MG PO TBDP
4.0000 mg | ORAL_TABLET | Freq: Once | ORAL | Status: AC | PRN
  Administered 2020-06-04: 4 mg via ORAL
  Filled 2020-06-04: qty 1

## 2020-06-04 NOTE — ED Notes (Signed)
Pt worried about her dog and decided she no longer wanted to stay.

## 2020-06-04 NOTE — Progress Notes (Addendum)
At bedside .assesed with ultrasound for PIV. V ein located [place].  Pt states is going to leave AMA to take the dog to the vet.  RN aware pt refused IV start.

## 2020-06-04 NOTE — ED Triage Notes (Signed)
Patient reports that she ate some meat that she had bought that was suspicious looking,but cooked it anyway. Today the patient, c/o vomiting and mid abdominal pain. Patient states her dog was sick as well and she had given him a piece of meat.

## 2020-06-04 NOTE — ED Provider Notes (Signed)
[facility] DEPT Provider Note   CSN: [id number] Arrival date & time: 06/04/20  0906     History Chief Complaint  Patient presents with   Abdominal Pain   Emesis    Carrie Guerra is a 34 y.o. female.  34 year old female presents with possible bad food exposure.  States that she a piece of steak few nights ago that caused her to have abdominal cramping with nonbilious emesis.  Denies any diarrhea.  Notes that her pet also ate the same meat and developed similar symptoms although they developed diarrhea.  Denies any fever or chills.  Developed a mild headache with this and took tramadol with relief.        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 ([place]) 11/09/2016   Elevated bilirubin 2021   Elevated lipase 2021   Fibroid    Fibroids    Low blood potassium 2021   Migraines    menstrual migraine wiht aura    Patient Active Problem List   Diagnosis Date Noted   Vomiting 11/17/2016   Hypokalemia 11/17/2016   Cannabis hyperemesis syndrome concurrent with and due to cannabis abuse ([place]) 11/09/2016    Past Surgical History:  Procedure Laterality Date   ABDOMINAL HYSTERECTOMY     broken hand Left    pins placed in left hand   NO PAST SURGERIES       OB History    Gravida  0   Para  0   Term  0   Preterm  0   AB  0   Living  0     SAB  0   IAB  0   Ectopic  0   Multiple  0   Live Births  0           Family History  Problem Relation Age of Onset   Diabetes Mother    Breast cancer Mother        Pt.thinks she may have had br.ca   Hypertension Mother    Diabetes Other    Hypertension Maternal Grandmother     Social History   Tobacco Use   Smoking status: Former Smoker    Types: Cigarettes   Smokeless tobacco: Never Used   Tobacco comment: 11/17/2016 "someday smoker when I did smoke"   Vaping Use   Vaping Use: Former  Substance Use Topics   Alcohol use: Yes    Comment: occassionly   Drug use: Yes    Types: Marijuana    Comment: Hx of marijuana use    Home Medications Prior to Admission medications   Medication Sig Start Date End Date Taking? Authorizing Provider  cephALEXin (KEFLEX) 500 MG capsule 2 caps po bid x 7 days 12/28/19   Domenic Moras, PA-C  ibuprofen (ADVIL) 600 MG tablet Take 600 mg by mouth every 6 (six) hours as needed for mild pain or moderate pain.  12/26/19   [provider]  ondansetron (ZOFRAN ODT) 4 MG disintegrating tablet Take 1 tablet (4 mg total) by mouth every 8 (eight) hours as needed for nausea or vomiting. 12/28/19   Domenic Moras, PA-C  oxyCODONE (OXY IR/ROXICODONE) 5 MG immediate release tablet Take 5 mg by mouth See admin instructions. Take 1 tablet (5mg ) by mouth 4-5 times daily as needed for pain. 12/26/19   [provider]  pantoprazole (PROTONIX) 20 MG tablet Take 1  tablet (20 mg total) by mouth daily. Patient not taking: Reported on 12/28/2019 06/22/19 12/28/19  Muthersbaugh, [name], PA-C  potassium chloride (KLOR-CON) 10 MEQ tablet Take 1 tablet (10 mEq total) by mouth 2 (two) times daily. Patient not taking: Reported on 12/28/2019 08/31/19 12/28/19  Ezequiel Essex, MD  prochlorperazine (COMPAZINE) 25 MG suppository Place 1 suppository (25 mg total) rectally every 12 (twelve) hours as needed for nausea or vomiting. Patient not taking: Reported on 04/04/2017 11/08/16 06/22/19  Lorin Glass, PA-C  promethazine (PHENERGAN) 25 MG tablet Take 1 tablet (25 mg total) by mouth every 6 (six) hours as needed for nausea or vomiting. Patient not taking: Reported on 12/28/2019 08/31/19 12/28/19  Ezequiel Essex, MD  sucralfate (CARAFATE) 1 g tablet Take 1 tablet (1 g total) by mouth 4 (four) times daily -  with meals and at bedtime. Patient not taking: Reported on 12/28/2019 06/22/19 12/28/19  Muthersbaugh, [name], PA-C    Allergies    Cabbage  and Shrimp [shellfish allergy]  Review of Systems   Review of Systems  All other systems reviewed and are negative.   Physical Exam Updated Vital Signs BP (!) 154/93    Pulse 62    Temp 97.9 F (36.6 C) (Oral)    Resp 18    Ht 1.524 m (5')    Wt 61.2 kg    LMP 08/29/2019    SpO2 100%    BMI 26.37 kg/m   Physical Exam Vitals and nursing note reviewed.  Constitutional:      General: She is not in acute distress.    Appearance: Normal appearance. She is well-developed and well-nourished. She is not toxic-appearing.  HENT:     Head: Normocephalic and atraumatic.  Eyes:     General: Lids are normal.     Extraocular Movements: EOM normal.     Conjunctiva/sclera: Conjunctivae normal.     Pupils: Pupils are equal, round, and reactive to light.  Neck:     Thyroid: No thyroid mass.     Trachea: No tracheal deviation.  Cardiovascular:     Rate and Rhythm: Normal rate and regular rhythm.     Heart sounds: Normal heart sounds. No murmur heard. No gallop.   Pulmonary:     Effort: Pulmonary effort is normal. No respiratory distress.     Breath sounds: Normal breath sounds. No stridor. No decreased breath sounds, wheezing, rhonchi or rales.  Abdominal:     General: Bowel sounds are normal. There is no distension.     Palpations: Abdomen is soft.     Tenderness: There is no abdominal tenderness. There is no CVA tenderness or rebound.  Musculoskeletal:        General: No tenderness or edema. Normal range of motion.     Cervical back: Normal range of motion and neck supple.  Skin:    General: Skin is warm and dry.     Findings: No abrasion or rash.  Neurological:     Mental Status: She is alert and oriented to person, place, and time.     GCS: GCS eye subscore is 4. GCS verbal subscore is 5. GCS motor subscore is 6.     Cranial Nerves: No cranial nerve deficit.     Sensory: No sensory deficit.     Deep Tendon Reflexes: Strength normal.  Psychiatric:        Mood and Affect: Mood and  affect normal.        Speech: Speech normal.  Behavior: Behavior normal.     ED Results / Procedures / Treatments   Labs (all labs ordered are listed, but only abnormal results are displayed) Labs Reviewed  LIPASE, BLOOD  COMPREHENSIVE METABOLIC PANEL  CBC  URINALYSIS, ROUTINE W REFLEX MICROSCOPIC    EKG None  Radiology No results found.  Procedures Procedures (including critical care time)  Medications Ordered in ED Medications  sodium chloride 0.9 % bolus 1,000 mL (has no administration in time range)  0.9 %  sodium chloride infusion (has no administration in time range)  metoCLOPramide (REGLAN) injection 5 mg (has no administration in time range)  ondansetron (ZOFRAN-ODT) disintegrating tablet 4 mg (4 mg Oral Given 06/04/20 7681)    ED Course  I have reviewed the triage vital signs and the nursing notes.  Pertinent labs & imaging results that were available during my care of the patient were reviewed by me and considered in my medical decision making (see chart for details).    MDM Rules/Calculators/A&P                         Pt left ama Final Clinical Impression(s) / ED Diagnoses Final diagnoses:  None    Rx / DC Orders ED Discharge Orders    None       Lacretia Leigh, MD 06/09/20 1027

## 2020-06-15 ENCOUNTER — Emergency Department (HOSPITAL_COMMUNITY): Payer: 59

## 2020-06-15 ENCOUNTER — Encounter (HOSPITAL_COMMUNITY): Payer: Self-pay

## 2020-06-15 ENCOUNTER — Other Ambulatory Visit: Payer: Self-pay

## 2020-06-15 ENCOUNTER — Emergency Department (HOSPITAL_COMMUNITY)
Admission: EM | Admit: 2020-06-15 | Discharge: 2020-06-15 | Disposition: A | Discharge status: Home / Self Care | Payer: 59 | Attending: Emergency Medicine | Admitting: Emergency Medicine

## 2020-06-15 DIAGNOSIS — R1110 Vomiting, unspecified: Secondary | ICD-10-CM | POA: Insufficient documentation

## 2020-06-15 DIAGNOSIS — Z79899 Other long term (current) drug therapy: Secondary | ICD-10-CM | POA: Insufficient documentation

## 2020-06-15 DIAGNOSIS — R1084 Generalized abdominal pain: Secondary | ICD-10-CM | POA: Insufficient documentation

## 2020-06-15 DIAGNOSIS — J45909 Unspecified asthma, uncomplicated: Secondary | ICD-10-CM | POA: Insufficient documentation

## 2020-06-15 DIAGNOSIS — Z87891 Personal history of nicotine dependence: Secondary | ICD-10-CM | POA: Diagnosis not present

## 2020-06-15 DIAGNOSIS — R109 Unspecified abdominal pain: Secondary | ICD-10-CM | POA: Diagnosis present

## 2020-06-15 LAB — COMPREHENSIVE METABOLIC PANEL
ALT: 46 U/L — ABNORMAL HIGH (ref 0–44)
AST: 55 U/L — ABNORMAL HIGH (ref 15–41)
Albumin: 4.9 g/dL (ref 3.5–5.0)
Alkaline Phosphatase: 51 U/L (ref 38–126)
Anion gap: 16 — ABNORMAL HIGH (ref 5–15)
BUN: 12 mg/dL (ref 6–20)
CO2: 19 mmol/L — ABNORMAL LOW (ref 22–32)
Calcium: 9.4 mg/dL (ref 8.9–10.3)
Chloride: 109 mmol/L (ref 98–111)
Creatinine, Ser: 1.25 mg/dL — ABNORMAL HIGH (ref 0.44–1.00)
GFR, Estimated: 58 mL/min — ABNORMAL LOW (ref >60)
Glucose, Bld: 144 mg/dL — ABNORMAL HIGH (ref 70–99)
Potassium: 3.1 mmol/L — ABNORMAL LOW (ref 3.5–5.1)
Sodium: 144 mmol/L (ref 135–145)
Total Bilirubin: 1 mg/dL (ref 0.3–1.2)
Total Protein: 8.9 g/dL — ABNORMAL HIGH (ref 6.5–8.1)

## 2020-06-15 LAB — URINALYSIS, ROUTINE W REFLEX MICROSCOPIC
Bilirubin Urine: NEGATIVE
Glucose, UA: 50 mg/dL — AB
Ketones, ur: 20 mg/dL — AB
Leukocytes,Ua: NEGATIVE
Nitrite: NEGATIVE
Protein, ur: >=300 mg/dL — AB
Specific Gravity, Urine: 1.016 (ref 1.005–1.030)
pH: 7 (ref 5.0–8.0)

## 2020-06-15 LAB — CBC WITH DIFFERENTIAL/PLATELET
Abs Immature Granulocytes: 0.05 10*3/uL (ref 0.00–0.07)
Basophils Absolute: 0.1 10*3/uL (ref 0.0–0.1)
Basophils Relative: 0 %
Eosinophils Absolute: 0 10*3/uL (ref 0.0–0.5)
Eosinophils Relative: 0 %
HCT: 36.4 % (ref 36.0–46.0)
Hemoglobin: 12.4 g/dL (ref 12.0–15.0)
Immature Granulocytes: 0 %
Lymphocytes Relative: 7 %
Lymphs Abs: 1 10*3/uL (ref 0.7–4.0)
MCH: 31.2 pg (ref 26.0–34.0)
MCHC: 34.1 g/dL (ref 30.0–36.0)
MCV: 91.5 fL (ref 80.0–100.0)
Monocytes Absolute: 1.1 10*3/uL — ABNORMAL HIGH (ref 0.1–1.0)
Monocytes Relative: 8 %
Neutro Abs: 11.8 10*3/uL — ABNORMAL HIGH (ref 1.7–7.7)
Neutrophils Relative %: 85 %
Platelets: 372 10*3/uL (ref 150–400)
RBC: 3.98 MIL/uL (ref 3.87–5.11)
RDW: 11.5 % (ref 11.5–15.5)
WBC: 14 10*3/uL — ABNORMAL HIGH (ref 4.0–10.5)
nRBC: 0 % (ref 0.0–0.2)

## 2020-06-15 LAB — RAPID URINE DRUG SCREEN, HOSP PERFORMED
Amphetamines: NOT DETECTED
Barbiturates: NOT DETECTED
Benzodiazepines: NOT DETECTED
Cocaine: NOT DETECTED
Opiates: NOT DETECTED
Tetrahydrocannabinol: POSITIVE — AB

## 2020-06-15 LAB — LIPASE, BLOOD: Lipase: 19 U/L (ref 11–51)

## 2020-06-15 MED ORDER — LACTATED RINGERS IV BOLUS
1000.0000 mL | Freq: Once | INTRAVENOUS | Status: AC
  Administered 2020-06-15: 1000 mL via INTRAVENOUS

## 2020-06-15 MED ORDER — ONDANSETRON HCL 4 MG/2ML IJ SOLN
4.0000 mg | Freq: Once | INTRAMUSCULAR | Status: AC
  Administered 2020-06-15: 18:00:00 4 mg via INTRAVENOUS
  Filled 2020-06-15: qty 2

## 2020-06-15 MED ORDER — HALOPERIDOL LACTATE 5 MG/ML IJ SOLN
5.0000 mg | Freq: Once | INTRAMUSCULAR | Status: AC
  Administered 2020-06-15: 5 mg via INTRAVENOUS
  Filled 2020-06-15: qty 1

## 2020-06-15 MED ORDER — POTASSIUM CHLORIDE CRYS ER 20 MEQ PO TBCR
40.0000 meq | EXTENDED_RELEASE_TABLET | Freq: Once | ORAL | Status: DC
  Filled 2020-06-15: qty 2

## 2020-06-15 MED ORDER — IOHEXOL 300 MG/ML  SOLN
100.0000 mL | Freq: Once | INTRAMUSCULAR | Status: AC | PRN
  Administered 2020-06-15: 19:00:00 100 mL via INTRAVENOUS

## 2020-06-15 MED ORDER — LACTATED RINGERS IV BOLUS
1000.0000 mL | Freq: Once | INTRAVENOUS | Status: AC
  Administered 2020-06-15: 18:00:00 1000 mL via INTRAVENOUS

## 2020-06-15 MED ORDER — ONDANSETRON 4 MG PO TBDP: 4.0000 mg | ORAL_TABLET | Freq: Three times a day (TID) | ORAL | 0 refills | Status: DC | PRN

## 2020-06-15 NOTE — ED Notes (Signed)
Pt given water for PO fluid challenge

## 2020-06-15 NOTE — ED Triage Notes (Addendum)
Pt BIB EMS from home. Pt c/o lower abdominal pain x2 days and emesis. Pt states she felt this way when thyroid was removed. Pt denies urinary symptoms. Pt states she has not had marijuana in 2 days.   EMS gave 4 mg zofran and 100 mcg fentanyl

## 2020-06-15 NOTE — Discharge Instructions (Addendum)
Please follow up with your primary care doctor and OBGYN. I have also provided you with a gastroenterologist to follow-up with as well.  Please use Zofran as needed for nausea.  Please monitor your symptoms.  Drink plenty of water.  Please stop using marijuana. Avoid alcohol as well for the time being and adhere to a bland food diet.  This should consist of toast, avocado, rice

## 2020-06-15 NOTE — ED Provider Notes (Signed)
[facility] DEPT Provider Note   CSN: [id number] Arrival date & time: 06/15/20  1357     History Chief Complaint  Patient presents with  . Abdominal Pain  . Emesis    Carrie Guerra is a 34 y.o. female.  HPI  Patient is a 34 year old female with past medical history significant for fibroids now s/p hysterectomy, anemia, migraines, cannabinoid hyperemesis, hypokalemia  Patient is presented to the ER today brought in by EMS from home with complaints of lower abdominal pain for 2 days and nausea and frequent episodes of emesis.  She states that it has been nonbloody nonbilious emesis.  Patient states that her pain is 10/10, achy and sharp and stabbing.  She states it is nonradiating located across her entire lower abdomen denies any aggravating or mitigating factors apart from symptoms being better when she lies on her right side and worse when she vomits.  She was given 4 mg of Zofran by EMS and 100 mcg of fentanyl.  She states she feels no improvement currently.  She denies any other associated symptoms.  She denies any urinary frequency, urgency or dysuria or hematuria.  She states that she has not been able to keep down any food since her symptoms began 2 days ago.  She denies any change of her bowel movements apart from decreased bowel movements which she attributes to not eating anything.     Past Medical History:  Diagnosis Date  . Anemia 2021  . Asthma   . Cannabis hyperemesis syndrome concurrent with and due to cannabis abuse ([place]) 11/09/2016  . Elevated bilirubin 2021  . Elevated lipase 2021  . Fibroid   . Fibroids   . Low blood potassium 2021  . Migraines    menstrual migraine wiht aura    Patient Active Problem List   Diagnosis Date Noted  . Vomiting 11/17/2016  . Hypokalemia 11/17/2016  . 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 ABDOMINAL HYSTERECTOMY    . broken hand Left    pins placed in left hand  . NO PAST SURGERIES       OB History    Gravida  0   Para  0   Term  0   Preterm  0   AB  0   Living  0     SAB  0   IAB  0   Ectopic  0   Multiple  0   Live Births  0           Family History  Problem Relation Age of Onset  . Diabetes Mother   . Breast cancer Mother        Pt.thinks she may have had br.ca  . Hypertension Mother   . Diabetes Other   . Hypertension Maternal Grandmother     Social History   Tobacco Use  . Smoking status: Former Smoker    Types: Cigarettes  . Smokeless tobacco: Never Used  . Tobacco comment: 11/17/2016 "someday smoker when I did smoke"  Vaping Use  . Vaping Use: Former  Substance Use Topics  . Alcohol use: Yes    Comment: occassionly  . Drug use: Yes    Types: Marijuana    Comment: Hx of marijuana use    Home Medications Prior to Admission medications   Medication Sig Start Date End Date Taking? Authorizing Provider  acetaminophen (TYLENOL) 325  MG tablet Take 650 mg by mouth every 6 (six) hours as needed for mild pain, fever or headache.   Yes [provider]  cephALEXin (KEFLEX) 500 MG capsule 2 caps po bid x 7 days Patient not taking: Reported on 06/15/2020 12/28/19   Domenic Moras, PA-C  ondansetron (ZOFRAN ODT) 4 MG disintegrating tablet Take 1-2 tablets (4-8 mg total) by mouth every 8 (eight) hours as needed for nausea or vomiting. 06/15/20   Tedd Sias, PA  pantoprazole (PROTONIX) 20 MG tablet Take 1 tablet (20 mg total) by mouth daily. Patient not taking: Reported on 12/28/2019 06/22/19 12/28/19  Muthersbaugh, [name], PA-C  potassium chloride (KLOR-CON) 10 MEQ tablet Take 1 tablet (10 mEq total) by mouth 2 (two) times daily. Patient not taking: Reported on 12/28/2019 08/31/19 12/28/19  Ezequiel Essex, MD  prochlorperazine (COMPAZINE) 25 MG suppository Place 1 suppository (25 mg total) rectally every 12 (twelve) hours as needed  for nausea or vomiting. Patient not taking: Reported on 04/04/2017 11/08/16 06/22/19  Lorin Glass, PA-C  promethazine (PHENERGAN) 25 MG tablet Take 1 tablet (25 mg total) by mouth every 6 (six) hours as needed for nausea or vomiting. Patient not taking: Reported on 12/28/2019 08/31/19 12/28/19  Ezequiel Essex, MD  sucralfate (CARAFATE) 1 g tablet Take 1 tablet (1 g total) by mouth 4 (four) times daily -  with meals and at bedtime. Patient not taking: Reported on 12/28/2019 06/22/19 12/28/19  Muthersbaugh, [name], PA-C    Allergies    Cabbage and Shrimp [shellfish allergy]  Review of Systems   Review of Systems  Constitutional: Positive for fatigue. Negative for chills and fever.  HENT: Negative for congestion.   Eyes: Negative for pain.  Respiratory: Negative for cough and shortness of breath.   Cardiovascular: Negative for chest pain and leg swelling.  Gastrointestinal: Positive for abdominal pain, nausea and vomiting. Negative for constipation and diarrhea.  Genitourinary: Negative for dysuria.  Musculoskeletal: Negative for myalgias.  Skin: Negative for rash.  Neurological: Negative for dizziness and headaches.    Physical Exam Updated Vital Signs BP 105/87   Pulse 91   Temp 98.3 F (36.8 C) (Oral)   Resp (!) 22   LMP 08/29/2019   SpO2 99%   Physical Exam Vitals and nursing note reviewed.  Constitutional:      General: She is in acute distress.     Comments: Patient is 34 year old female appears older than stated age is writhing in bed clutching her upper abdomen and requesting medication.  Evaluation initially deferred for approximately 10 minutes in order to place IV and have nursing staff administer Haldol.  HENT:     Head: Normocephalic and atraumatic.     Nose: Nose normal.  Eyes:     General: No scleral icterus. Cardiovascular:     Rate and Rhythm: Normal rate and regular rhythm.     Pulses: Normal pulses.     Heart sounds: Normal heart sounds.  Pulmonary:      Effort: Pulmonary effort is normal. No respiratory distress.     Breath sounds: No wheezing.  Abdominal:     Palpations: Abdomen is soft.     Tenderness: There is generalized abdominal tenderness.     Comments: Patient has soft nondistended abdomen.  Bowel sounds are present.  She has diffuse generalized tenderness.  She is voluntarily guarding.  She still endorses tenderness with palpation during distraction however is no longer guarding.  She has no CVA tenderness.  No focal lower abdominal tenderness.  Genitourinary:    Comments: Patient deferred-educated patient on inability to evaluate for STD/cervicitis/ovarian torsion without pelvic exam and she is understanding of this. Musculoskeletal:     Cervical back: Normal range of motion.     Right lower leg: No edema.     Left lower leg: No edema.  Skin:    General: Skin is warm and dry.     Capillary Refill: Capillary refill takes less than 2 seconds.  Neurological:     Mental Status: She is alert. Mental status is at baseline.  Psychiatric:        Mood and Affect: Mood normal.        Behavior: Behavior normal.     ED Results / Procedures / Treatments   Labs (all labs ordered are listed, but only abnormal results are displayed) Labs Reviewed  COMPREHENSIVE METABOLIC PANEL - Abnormal; Notable for the following components:      Result Value   Potassium 3.1 (*)    CO2 19 (*)    Glucose, Bld 144 (*)    Creatinine, Ser 1.25 (*)    Total Protein 8.9 (*)    AST 55 (*)    ALT 46 (*)    GFR, Estimated 58 (*)    Anion gap 16 (*)    All other components within normal limits  CBC WITH DIFFERENTIAL/PLATELET - Abnormal; Notable for the following components:   WBC 14.0 (*)    Neutro Abs 11.8 (*)    Monocytes Absolute 1.1 (*)    All other components within normal limits  URINALYSIS, ROUTINE W REFLEX MICROSCOPIC - Abnormal; Notable for the following components:   APPearance HAZY (*)    Glucose, UA 50 (*)    Hgb urine dipstick SMALL (*)     Ketones, ur 20 (*)    Protein, ur >=300 (*)    Bacteria, UA RARE (*)    All other components within normal limits  RAPID URINE DRUG SCREEN, HOSP PERFORMED - Abnormal; Notable for the following components:   Tetrahydrocannabinol POSITIVE (*)    All other components within normal limits  LIPASE, BLOOD    EKG EKG Interpretation  Date/Time:  Monday June 15 2020 17:48:00 EST Ventricular Rate:  80 PR Interval:    QRS Duration: 76 QT Interval:  396 QTC Calculation: 457 R Axis:   75 Text Interpretation: Sinus rhythm Consider left atrial enlargement No significant change since last tracing Confirmed by Gareth Morgan [phone]) on 06/15/2020 6:21:00 PM   Radiology CT ABDOMEN PELVIS W CONTRAST  Result Date: 06/15/2020 CLINICAL DATA:  Abdominal pain and vomiting x2 days. EXAM: CT ABDOMEN AND PELVIS WITH CONTRAST TECHNIQUE: Multidetector CT imaging of the abdomen and pelvis was performed using the standard protocol following bolus administration of intravenous contrast. CONTRAST:  114mL OMNIPAQUE IOHEXOL 300 MG/ML  SOLN COMPARISON:  December 28, 2019 FINDINGS: Lower chest: No acute abnormality. Hepatobiliary: No focal liver abnormality is seen. No gallstones, gallbladder wall thickening, or biliary dilatation. Pancreas: Unremarkable. No pancreatic ductal dilatation or surrounding inflammatory changes. Spleen: Normal in size without focal abnormality. Adrenals/Urinary Tract: Adrenal glands are unremarkable. Kidneys are normal, without renal calculi, focal lesion, or hydronephrosis. Bladder is unremarkable. Stomach/Bowel: There is a small hiatal hernia. Appendix appears normal. No evidence of bowel wall thickening, distention, or inflammatory changes. Vascular/Lymphatic: No significant vascular findings are present. No enlarged abdominal or pelvic lymph nodes. Reproductive: Status post hysterectomy. A 1.4 cm left adnexal cyst is seen. Other: No abdominal wall hernia or abnormality. No abdominopelvic  ascites. Musculoskeletal: No acute or significant osseous findings. IMPRESSION: 1. No acute intra-abdominal findings. 2. Small hiatal hernia. Electronically Signed   By: Virgina Norfolk M.D.   On: 06/15/2020 19:01    Procedures Procedures (including critical care time)  Medications Ordered in ED Medications  haloperidol lactate (HALDOL) injection 5 mg (5 mg Intravenous Given 06/15/20 1526)  lactated ringers bolus 1,000 mL (0 mLs Intravenous Stopped 06/15/20 2050)  lactated ringers bolus 1,000 mL (0 mLs Intravenous Stopped 06/15/20 2051)  ondansetron (ZOFRAN) injection 4 mg (4 mg Intravenous Given 06/15/20 1750)  iohexol (OMNIPAQUE) 300 MG/ML solution 100 mL (100 mLs Intravenous Contrast Given 06/15/20 1840)    ED Course  I have reviewed the triage vital signs and the nursing notes.  Pertinent labs & imaging results that were available during my care of the patient were reviewed by me and considered in my medical decision making (see chart for details).  Patient is 34 year old female with history of cannabinoid hyperemesis presenting today with abdominal pain nausea and vomiting.  On my review of EMR it appears that she has had numerous episodes of cannabinoid induced hyperemesis.  She states that she stopped using marijuana for several months and did not have any episodes but recently restarted.  She has had symptoms for 2 days.  Physical exam is notable for diffuse abdominal tenderness with voluntary guarding.  No involuntary guarding.  No focal tenderness.  Patient exam was repeated after Haldol she significantly improved after this.  Patient labs below in clinical course. Patient is hypokalemic which is not unusual for her given she has frequent episodes of vomiting.  Given her abdominal exam I did obtain CT imaging which was negative for any intra-abdominal infection she does have a small hiatal hernia which she was informed of.  No other acute abnormalities.  Clinical Course as of  06/16/20 1445  Mon Jun 15, 2020  1709 Reevaluated patient at this time.  She is no longer vomiting after Haldol.  She appears to be in much less distress.  She is diffusely tender in her abdomen.  I discussed pelvic exam with her and she declines this at this time.  I did inform her that without a pelvic exam I am unable to evaluate her for ovarian torsion or vaginal infection.  She is understanding of this and continues to defer pelvic exam.  Given her symptoms, leukocytosis, diffuse tenderness on exam and history of surgery will obtain CT scan to evaluate for abscess or other intra-abdominal infection.  This will also evaluate for SBO given history of surgery. [WF]  1710 CBC with leukocytosis with left shift.  CMP with hypokalemia 3.1.  Creatinine 1.25, mildly low CO2 blood sugar only mildly elevated at 144.  AST and ALT mildly elevated consistent with prior.  Anion gap of 16/barely elevated.  Suspect that this is due to dehydration more than anything else contraction acidosis and possibly low CO2 due to hyperventilation. [WF]    Clinical Course User Index [WF] Tedd Sias, PA   MDM Rules/Calculators/A&P                          I discussed the patient with my attending physician prior to DC.   Patient reevaluated she is now resting comfortably in bed.  She seems to have improved with 2 L lactated Ringer's, 1 dose of Haldol and 1 dose of Zofran.  Patient given return precautions.  I feel reassured that her symptoms have  abated and she is tolerating p.o. She understands return precautions will follow up with gastroenterology.  Discharged with Zofran p.o.  Final Clinical Impression(s) / ED Diagnoses Final diagnoses:  Generalized abdominal pain    Rx / DC Orders ED Discharge Orders         Ordered    ondansetron (ZOFRAN ODT) 4 MG disintegrating tablet  Every 8 hours PRN        06/15/20 2049           Pati Gallo [place], [place] 06/16/20 1445    Gareth Morgan, MD 06/16/20  [phone]

## 2020-06-15 NOTE — ED Notes (Signed)
Patient was made aware we need a urine sample however she said she was not able to go right now. She was given a cup of water.

## 2020-06-15 NOTE — ED Notes (Addendum)
Pt in hallway refusing to go in room until nurse gives her pain meds. Pt informed that RN cannot give pain meds unless provider orders it, and provider will come to see and assess pt shortly. Pt asked to please go back in room. Pati Gallo, PA aware.

## 2020-06-15 NOTE — ED Notes (Signed)
Pt request IV be removed so she could leave. Refused K+ and refused to wait on discharge papers.

## 2020-06-15 NOTE — ED Notes (Signed)
Pt refusing 5 lead cardiac monitoring at this time. Pt states her pain is too severe.

## 2020-06-17 ENCOUNTER — Encounter (HOSPITAL_COMMUNITY): Payer: Self-pay | Admitting: Emergency Medicine

## 2020-06-17 ENCOUNTER — Emergency Department (HOSPITAL_COMMUNITY)
Admission: EM | Admit: 2020-06-17 | Discharge: 2020-06-17 | Disposition: A | Discharge status: Home / Self Care | Payer: 59 | Attending: Emergency Medicine | Admitting: Emergency Medicine

## 2020-06-17 ENCOUNTER — Other Ambulatory Visit: Payer: Self-pay

## 2020-06-17 DIAGNOSIS — J45909 Unspecified asthma, uncomplicated: Secondary | ICD-10-CM | POA: Diagnosis not present

## 2020-06-17 DIAGNOSIS — R1110 Vomiting, unspecified: Secondary | ICD-10-CM | POA: Diagnosis not present

## 2020-06-17 DIAGNOSIS — Z87891 Personal history of nicotine dependence: Secondary | ICD-10-CM | POA: Diagnosis not present

## 2020-06-17 DIAGNOSIS — R109 Unspecified abdominal pain: Secondary | ICD-10-CM | POA: Insufficient documentation

## 2020-06-17 LAB — CBC
HCT: 37.3 % (ref 36.0–46.0)
Hemoglobin: 12.5 g/dL (ref 12.0–15.0)
MCH: 30.7 pg (ref 26.0–34.0)
MCHC: 33.5 g/dL (ref 30.0–36.0)
MCV: 91.6 fL (ref 80.0–100.0)
Platelets: 371 10*3/uL (ref 150–400)
RBC: 4.07 MIL/uL (ref 3.87–5.11)
RDW: 11.4 % — ABNORMAL LOW (ref 11.5–15.5)
WBC: 6.2 10*3/uL (ref 4.0–10.5)
nRBC: 0 % (ref 0.0–0.2)

## 2020-06-17 LAB — I-STAT BETA HCG BLOOD, ED (MC, WL, AP ONLY): I-stat hCG, quantitative: <5 m[IU]/mL (ref <5)

## 2020-06-17 LAB — COMPREHENSIVE METABOLIC PANEL
ALT: 39 U/L (ref 0–44)
AST: 42 U/L — ABNORMAL HIGH (ref 15–41)
Albumin: 4.6 g/dL (ref 3.5–5.0)
Alkaline Phosphatase: 45 U/L (ref 38–126)
Anion gap: 14 (ref 5–15)
BUN: 14 mg/dL (ref 6–20)
CO2: 20 mmol/L — ABNORMAL LOW (ref 22–32)
Calcium: 9.4 mg/dL (ref 8.9–10.3)
Chloride: 105 mmol/L (ref 98–111)
Creatinine, Ser: 1.18 mg/dL — ABNORMAL HIGH (ref 0.44–1.00)
GFR, Estimated: >60 mL/min (ref >60)
Glucose, Bld: 143 mg/dL — ABNORMAL HIGH (ref 70–99)
Potassium: 3.5 mmol/L (ref 3.5–5.1)
Sodium: 139 mmol/L (ref 135–145)
Total Bilirubin: 1.4 mg/dL — ABNORMAL HIGH (ref 0.3–1.2)
Total Protein: 8.8 g/dL — ABNORMAL HIGH (ref 6.5–8.1)

## 2020-06-17 LAB — LIPASE, BLOOD: Lipase: 21 U/L (ref 11–51)

## 2020-06-17 LAB — ETHANOL: Alcohol, Ethyl (B): <10 mg/dL (ref <10)

## 2020-06-17 MED ORDER — HALOPERIDOL LACTATE 5 MG/ML IJ SOLN
5.0000 mg | Freq: Once | INTRAMUSCULAR | Status: AC
  Administered 2020-06-17: 11:00:00 5 mg via INTRAVENOUS
  Filled 2020-06-17: qty 1

## 2020-06-17 MED ORDER — FAMOTIDINE IN NACL 20-0.9 MG/50ML-% IV SOLN
20.0000 mg | Freq: Once | INTRAVENOUS | Status: AC
  Administered 2020-06-17: 14:00:00 20 mg via INTRAVENOUS
  Filled 2020-06-17: qty 50

## 2020-06-17 MED ORDER — OMEPRAZOLE 20 MG PO CPDR: 20.0000 mg | DELAYED_RELEASE_CAPSULE | Freq: Every day | ORAL | 0 refills | Status: DC

## 2020-06-17 MED ORDER — HALOPERIDOL LACTATE 5 MG/ML IJ SOLN
2.0000 mg | Freq: Once | INTRAMUSCULAR | Status: AC
  Administered 2020-06-17: 13:00:00 2 mg via INTRAVENOUS
  Filled 2020-06-17: qty 1

## 2020-06-17 MED ORDER — CAPSAICIN 0.025 % EX CREA
TOPICAL_CREAM | Freq: Once | CUTANEOUS | Status: AC
  Filled 2020-06-17: qty 60

## 2020-06-17 MED ORDER — CAPSAICIN 0.075 % EX CREA: 1.0000 "application " | TOPICAL_CREAM | Freq: Four times a day (QID) | CUTANEOUS | 0 refills | Status: DC | PRN

## 2020-06-17 MED ORDER — LACTATED RINGERS IV BOLUS
2000.0000 mL | Freq: Once | INTRAVENOUS | Status: AC
  Administered 2020-06-17: 11:00:00 2000 mL via INTRAVENOUS

## 2020-06-17 MED ORDER — PROMETHAZINE HCL 25 MG RE SUPP: 25.0000 mg | Freq: Four times a day (QID) | RECTAL | 0 refills | Status: DC | PRN

## 2020-06-17 MED ORDER — ONDANSETRON 4 MG PO TBDP
4.0000 mg | ORAL_TABLET | Freq: Once | ORAL | Status: AC | PRN
  Administered 2020-06-17: 09:00:00 4 mg via ORAL
  Filled 2020-06-17: qty 1

## 2020-06-17 NOTE — ED Triage Notes (Signed)
Woke up this morning with emesis and nausea, denies abdominal pain or fevers in triage, endorses diarrhea since this morning as well.

## 2020-06-17 NOTE — Discharge Instructions (Addendum)
1.  At this time it is suspected that you have cannabinoids hyperemesis syndrome.  This is a syndrome of severe vomiting after long-term marijuana use.  Read the information on this condition. 2.  You had a CT scan done recently that did not show any immediate surgical problems.  Be sure to schedule a follow-up appointment with your family doctor as soon as possible.  Ideally within 2 to 3 days.  If you do not have a family doctor, use the referral number your discharge instructions to find 1. 3.  If your symptoms are worsening despite using the Phenergan prescribed today and Zofran prescribed previously, as well as taking omeprazole daily as prescribed and applying capsaicin ointment, return to the emergency department for recheck.

## 2020-06-17 NOTE — ED Provider Notes (Signed)
At her [facility] DEPT Provider Note   CSN: [id number] Arrival date & time: 06/17/20  6415     History Chief Complaint  Patient presents with   Nausea   Emesis    Carrie Guerra is a 34 y.o. female.  HPI Patient reports severe abdominal pain started this morning.  Is intense and burning sharp pain upper and central abdomen.  Nothing is helping the pain.  She reports she has vomited multiple times is having dry heaves.  No diarrhea.  No fever.  Patient reports pain awakened her from sleep.  Patient seen for similar pain 2 days ago.  She reports the pain had initially resolved but then came back again early this morning.  Reports she was at home in bed all day yesterday.    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 ([place]) 11/09/2016   Elevated bilirubin 2021   Elevated lipase 2021   Fibroid    Fibroids    Low blood potassium 2021   Migraines    menstrual migraine wiht aura    Patient Active Problem List   Diagnosis Date Noted   Vomiting 11/17/2016   Hypokalemia 11/17/2016   Cannabis hyperemesis syndrome concurrent with and due to cannabis abuse ([place]) 11/09/2016    Past Surgical History:  Procedure Laterality Date   ABDOMINAL HYSTERECTOMY     broken hand Left    pins placed in left hand   NO PAST SURGERIES       OB History    Gravida  0   Para  0   Term  0   Preterm  0   AB  0   Living  0     SAB  0   IAB  0   Ectopic  0   Multiple  0   Live Births  0           Family History  Problem Relation Age of Onset   Diabetes Mother    Breast cancer Mother        Pt.thinks she may have had br.ca   Hypertension Mother    Diabetes Other    Hypertension Maternal Grandmother     Social History   Tobacco Use   Smoking status: Former Smoker    Types: Cigarettes   Smokeless tobacco: Never Used    Tobacco comment: 11/17/2016 "someday smoker when I did smoke"  Vaping Use   Vaping Use: Former  Substance Use Topics   Alcohol use: Yes    Comment: occassionly   Drug use: Yes    Types: Marijuana    Comment: Hx of marijuana use    Home Medications Prior to Admission medications   Medication Sig Start Date End Date Taking? Authorizing Provider  acetaminophen (TYLENOL) 325 MG tablet Take 650 mg by mouth every 6 (six) hours as needed for mild pain, fever or headache.   Yes [provider]  capsicum (ZOSTRIX) 0.075 % topical cream Apply 1 application topically 4 (four) times daily as needed. This medication can help with pain and vomiting from marijuana use.  Try applying it to 4 times a day to your abdomen to help symptoms. 06/17/20   Charlesetta Shanks, MD  omeprazole (PRILOSEC) 20 MG capsule Take 1 capsule (20 mg total) by mouth daily. 06/17/20   Charlesetta Shanks, MD  ondansetron (ZOFRAN ODT) 4 MG disintegrating tablet Take 1-2  tablets (4-8 mg total) by mouth every 8 (eight) hours as needed for nausea or vomiting. Patient not taking: No sig reported 06/15/20   Tedd Sias, PA  promethazine (PHENERGAN) 25 MG suppository Place 1 suppository (25 mg total) rectally every 6 (six) hours as needed for nausea or vomiting. 06/17/20   Charlesetta Shanks, MD  pantoprazole (PROTONIX) 20 MG tablet Take 1 tablet (20 mg total) by mouth daily. Patient not taking: Reported on 12/28/2019 06/22/19 12/28/19  Muthersbaugh, [name], PA-C  potassium chloride (KLOR-CON) 10 MEQ tablet Take 1 tablet (10 mEq total) by mouth 2 (two) times daily. Patient not taking: Reported on 12/28/2019 08/31/19 12/28/19  Ezequiel Essex, MD  prochlorperazine (COMPAZINE) 25 MG suppository Place 1 suppository (25 mg total) rectally every 12 (twelve) hours as needed for nausea or vomiting. Patient not taking: Reported on 04/04/2017 11/08/16 06/22/19  Lorin Glass, PA-C  sucralfate (CARAFATE) 1 g tablet Take 1 tablet (1 g total) by  mouth 4 (four) times daily -  with meals and at bedtime. Patient not taking: Reported on 12/28/2019 06/22/19 12/28/19  Muthersbaugh, [name], PA-C    Allergies    Cabbage and Shrimp [shellfish allergy]  Review of Systems   Review of Systems 10 systems reviewed and negative except as per HPI Physical Exam Updated Vital Signs BP (!) 164/104    Pulse 96    Temp 97.6 F (36.4 C) (Oral)    Resp 20    Ht 5' (1.524 m)    Wt 62 kg    LMP 08/29/2019    SpO2 100%    BMI 26.69 kg/m   Physical Exam Constitutional:      Comments: Patient is walking around the room screaming and clutching her abdomen.  When she lies down on the stretcher for exam she is drawing her legs up to her abdomen and shaking them.  Clinically, patient is nontoxic.  No respiratory distress.  HENT:     Mouth/Throat:     Pharynx: Oropharynx is clear.  Eyes:     Extraocular Movements: Extraocular movements intact.  Cardiovascular:     Rate and Rhythm: Normal rate and regular rhythm.  Pulmonary:     Effort: Pulmonary effort is normal.     Breath sounds: Normal breath sounds.  Abdominal:     Comments: Abdomen is completely soft.  Patient is screaming and thrashing her legs about.  Musculoskeletal:        General: Normal range of motion.     Comments: Ambulatory without difficulty no lower extremity swelling.  Skin:    General: Skin is warm and dry.  Neurological:     General: No focal deficit present.     Mental Status: She is oriented to person, place, and time.     Coordination: Coordination normal.  Psychiatric:     Comments: Patient is screaming and pacing.  She is in an agitated state.     ED Results / Procedures / Treatments   Labs (all labs ordered are listed, but only abnormal results are displayed) Labs Reviewed  COMPREHENSIVE METABOLIC PANEL - Abnormal; Notable for the following components:      Result Value   CO2 20 (*)    Glucose, Bld 143 (*)    Creatinine, Ser 1.18 (*)    Total Protein 8.8 (*)     AST 42 (*)    Total Bilirubin 1.4 (*)    All other components within normal limits  CBC - Abnormal; Notable for the following components:  RDW 11.4 (*)    All other components within normal limits  LIPASE, BLOOD  ETHANOL  URINALYSIS, ROUTINE W REFLEX MICROSCOPIC  RAPID URINE DRUG SCREEN, HOSP PERFORMED  I-STAT BETA HCG BLOOD, ED ([facility], [facility], [facility] ONLY)    EKG None  Radiology CT ABDOMEN PELVIS W CONTRAST  Result Date: 06/15/2020 CLINICAL DATA:  Abdominal pain and vomiting x2 days. EXAM: CT ABDOMEN AND PELVIS WITH CONTRAST TECHNIQUE: Multidetector CT imaging of the abdomen and pelvis was performed using the standard protocol following bolus administration of intravenous contrast. CONTRAST:  127mL OMNIPAQUE IOHEXOL 300 MG/ML  SOLN COMPARISON:  December 28, 2019 FINDINGS: Lower chest: No acute abnormality. Hepatobiliary: No focal liver abnormality is seen. No gallstones, gallbladder wall thickening, or biliary dilatation. Pancreas: Unremarkable. No pancreatic ductal dilatation or surrounding inflammatory changes. Spleen: Normal in size without focal abnormality. Adrenals/Urinary Tract: Adrenal glands are unremarkable. Kidneys are normal, without renal calculi, focal lesion, or hydronephrosis. Bladder is unremarkable. Stomach/Bowel: There is a small hiatal hernia. Appendix appears normal. No evidence of bowel wall thickening, distention, or inflammatory changes. Vascular/Lymphatic: No significant vascular findings are present. No enlarged abdominal or pelvic lymph nodes. Reproductive: Status post hysterectomy. A 1.4 cm left adnexal cyst is seen. Other: No abdominal wall hernia or abnormality. No abdominopelvic ascites. Musculoskeletal: No acute or significant osseous findings. IMPRESSION: 1. No acute intra-abdominal findings. 2. Small hiatal hernia. Electronically Signed   By: Virgina Norfolk M.D.   On: 06/15/2020 19:01    Procedures Procedures (including critical care time)  Medications Ordered in  ED Medications  famotidine (PEPCID) IVPB 20 mg premix (20 mg Intravenous New Bag/Given 06/17/20 1338)  ondansetron (ZOFRAN-ODT) disintegrating tablet 4 mg (4 mg Oral Given 06/17/20 0925)  haloperidol lactate (HALDOL) injection 5 mg (5 mg Intravenous Given 06/17/20 1121)  capsaicin (ZOSTRIX) 0.025 % cream ( Topical Given 06/17/20 1251)  lactated ringers bolus 2,000 mL (2,000 mLs Intravenous New Bag/Given 06/17/20 1122)  haloperidol lactate (HALDOL) injection 2 mg (2 mg Intravenous Given 06/17/20 1251)    ED Course  I have reviewed the triage vital signs and the nursing notes.  Pertinent labs & imaging results that were available during my care of the patient were reviewed by me and considered in my medical decision making (see chart for details).  Clinical Course as of 06/17/20 1400  Wed Jun 17, 2020  1227 Recheck: Patient looks improved.  She is now lying in the bed quietly.  When I ask how she is doing, she starts writhing a little bit and reports she needs some more pain and nausea medicine. [MP]    Clinical Course User Index [MP] Charlesetta Shanks, [place] recheck: Patient is now, and no further dry heaving.  At this time she needs to use the restroom and is up and ambulatory to the bathroom without difficulty to urinate. MDM Rules/Calculators/A&P                          Patient has similar presentation 2 days ago.  Patient has history of hyperemesis and recurrent abdominal pain.  At this time, patient is clinically well in appearance although exhibiting extreme behaviors of pain or discomfort.  Will obtain basic lab work and administer Haldol and hydration.  Patient's labs are stable.  No hypokalemia or signs of significant dehydration on lab work.  Patient had CT scan within the past 2 days due to similar presentation.  Patient has responded well to Haldol and hydration.  Patient counseled on cessation of marijuana.  Also counseled on use of Zofran, Phenergan and capsaicin.   Return precautions included in discharge instructions. Final Clinical Impression(s) / ED Diagnoses Final diagnoses:  Hyperemesis    Rx / DC Orders ED Discharge Orders         Ordered    omeprazole (PRILOSEC) 20 MG capsule  Daily        06/17/20 1353    promethazine (PHENERGAN) 25 MG suppository  Every 6 hours PRN        06/17/20 1353    capsicum (ZOSTRIX) 0.075 % topical cream  4 times daily PRN        06/17/20 1353           Charlesetta Shanks, MD 06/17/20 1400

## 2020-06-17 NOTE — ED Notes (Signed)
Patient in room screaming and hollaring. Stating please help me MD in room at this time

## 2020-06-19 ENCOUNTER — Encounter (HOSPITAL_COMMUNITY): Payer: Self-pay | Admitting: Emergency Medicine

## 2020-06-19 ENCOUNTER — Emergency Department (HOSPITAL_COMMUNITY)
Admission: EM | Admit: 2020-06-19 | Discharge: 2020-06-19 | Discharge status: Against Medical Advice | Payer: 59 | Attending: Emergency Medicine | Admitting: Emergency Medicine

## 2020-06-19 ENCOUNTER — Other Ambulatory Visit: Payer: Self-pay

## 2020-06-19 DIAGNOSIS — R197 Diarrhea, unspecified: Secondary | ICD-10-CM | POA: Diagnosis not present

## 2020-06-19 DIAGNOSIS — R109 Unspecified abdominal pain: Secondary | ICD-10-CM | POA: Insufficient documentation

## 2020-06-19 DIAGNOSIS — R112 Nausea with vomiting, unspecified: Secondary | ICD-10-CM | POA: Diagnosis present

## 2020-06-19 DIAGNOSIS — Z87891 Personal history of nicotine dependence: Secondary | ICD-10-CM | POA: Diagnosis not present

## 2020-06-19 DIAGNOSIS — E876 Hypokalemia: Secondary | ICD-10-CM | POA: Diagnosis not present

## 2020-06-19 DIAGNOSIS — J45909 Unspecified asthma, uncomplicated: Secondary | ICD-10-CM | POA: Insufficient documentation

## 2020-06-19 LAB — COMPREHENSIVE METABOLIC PANEL
ALT: 27 U/L (ref 0–44)
AST: 23 U/L (ref 15–41)
Albumin: 4.6 g/dL (ref 3.5–5.0)
Alkaline Phosphatase: 43 U/L (ref 38–126)
Anion gap: 14 (ref 5–15)
BUN: 15 mg/dL (ref 6–20)
CO2: 25 mmol/L (ref 22–32)
Calcium: 9.8 mg/dL (ref 8.9–10.3)
Chloride: 97 mmol/L — ABNORMAL LOW (ref 98–111)
Creatinine, Ser: 1.07 mg/dL — ABNORMAL HIGH (ref 0.44–1.00)
GFR, Estimated: >60 mL/min (ref >60)
Glucose, Bld: 113 mg/dL — ABNORMAL HIGH (ref 70–99)
Potassium: 2.8 mmol/L — ABNORMAL LOW (ref 3.5–5.1)
Sodium: 136 mmol/L (ref 135–145)
Total Bilirubin: 2 mg/dL — ABNORMAL HIGH (ref 0.3–1.2)
Total Protein: 8.5 g/dL — ABNORMAL HIGH (ref 6.5–8.1)

## 2020-06-19 LAB — URINALYSIS, ROUTINE W REFLEX MICROSCOPIC
Bacteria, UA: NONE SEEN
Bilirubin Urine: NEGATIVE
Glucose, UA: NEGATIVE mg/dL
Hgb urine dipstick: NEGATIVE
Ketones, ur: 20 mg/dL — AB
Leukocytes,Ua: NEGATIVE
Nitrite: NEGATIVE
Protein, ur: >=300 mg/dL — AB
Specific Gravity, Urine: 1.034 — ABNORMAL HIGH (ref 1.005–1.030)
pH: 5 (ref 5.0–8.0)

## 2020-06-19 LAB — CBC
HCT: 36.8 % (ref 36.0–46.0)
Hemoglobin: 12.4 g/dL (ref 12.0–15.0)
MCH: 30.4 pg (ref 26.0–34.0)
MCHC: 33.7 g/dL (ref 30.0–36.0)
MCV: 90.2 fL (ref 80.0–100.0)
Platelets: 365 10*3/uL (ref 150–400)
RBC: 4.08 MIL/uL (ref 3.87–5.11)
RDW: 11.2 % — ABNORMAL LOW (ref 11.5–15.5)
WBC: 7.1 10*3/uL (ref 4.0–10.5)
nRBC: 0 % (ref 0.0–0.2)

## 2020-06-19 LAB — PREGNANCY, URINE: Preg Test, Ur: NEGATIVE

## 2020-06-19 LAB — LIPASE, BLOOD: Lipase: 27 U/L (ref 11–51)

## 2020-06-19 MED ORDER — POTASSIUM CHLORIDE 10 MEQ/100ML IV SOLN
10.0000 meq | Freq: Once | INTRAVENOUS | Status: DC
  Filled 2020-06-19: qty 100

## 2020-06-19 MED ORDER — SODIUM CHLORIDE 0.9 % IV SOLN
1000.0000 mL | INTRAVENOUS | Status: DC
  Administered 2020-06-19: 16:00:00 1000 mL via INTRAVENOUS

## 2020-06-19 MED ORDER — LORAZEPAM 2 MG/ML IJ SOLN
1.0000 mg | Freq: Once | INTRAMUSCULAR | Status: AC
  Administered 2020-06-19: 16:00:00 1 mg via INTRAVENOUS
  Filled 2020-06-19: qty 1

## 2020-06-19 MED ORDER — ONDANSETRON HCL 4 MG/2ML IJ SOLN
4.0000 mg | Freq: Once | INTRAMUSCULAR | Status: AC
  Administered 2020-06-19: 16:00:00 4 mg via INTRAVENOUS
  Filled 2020-06-19: qty 2

## 2020-06-19 MED ORDER — POTASSIUM CHLORIDE CRYS ER 20 MEQ PO TBCR
40.0000 meq | EXTENDED_RELEASE_TABLET | Freq: Once | ORAL | Status: AC
  Administered 2020-06-19: 16:00:00 40 meq via ORAL
  Filled 2020-06-19: qty 2

## 2020-06-19 MED ORDER — SODIUM CHLORIDE 0.9 % IV BOLUS (SEPSIS)
1000.0000 mL | Freq: Once | INTRAVENOUS | Status: AC
  Administered 2020-06-19: 16:00:00 1000 mL via INTRAVENOUS

## 2020-06-19 MED ORDER — HALOPERIDOL LACTATE 5 MG/ML IJ SOLN
2.0000 mg | Freq: Once | INTRAMUSCULAR | Status: AC
  Administered 2020-06-19: 16:00:00 2 mg via INTRAVENOUS
  Filled 2020-06-19: qty 1

## 2020-06-19 NOTE — ED Notes (Addendum)
Pt is screaming, cussing, crying, and hitting the bed. Explained that consult to IV team has been placed. Unable to redirect or console pt.  Pt verbally aggressive toward staff.

## 2020-06-19 NOTE — ED Notes (Signed)
Unable to obtain IV access, IV team consult ordered, pt in room screaming loudly, inconsolable

## 2020-06-19 NOTE — ED Triage Notes (Signed)
Pt arrives to ED with c/o of dull LLQ abd pain, nausea, and vomiting x2 days. Was seen at [facility] for same. Pt with hx of cannabis hyperemesis syndrome and hypokalemia.

## 2020-06-19 NOTE — ED Provider Notes (Signed)
[facility] EMERGENCY DEPARTMENT Provider Note   CSN: [id number] Arrival date & time: 06/19/20  1300     History Chief Complaint  Patient presents with  . Nausea  . Emesis    Carrie Guerra is a 34 y.o. female.  HPI   Patient presented to the ED for evaluation of persistent abdominal pain as well as nausea and vomiting and diarrhea.  Patient has a history of recurrent episodes of abdominal pain and vomiting.  She has been previously diagnosed with cannabis hyperemesis syndrome.  Patient states she has not had marijuana for few weeks.  She states over the last several days she has not been able to eat or drink.  She is having multiple episodes of vomiting.  She is also had diarrhea.  She has abdominal cramping that is persisting but not as severe as when she was seen recently in the ED. Patient was seen in the ED for the same complaints back on December 20 as well as December 22.  Patient had laboratory tests and on December 20 she had a CT scan of her abdomen pelvis.  No acute findings were noted on the CT scan at that time. Past Medical History:  Diagnosis Date  . Anemia 2021  . Asthma   . 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 Elevated bilirubin 2021  . Elevated lipase 2021  . Fibroid   . Fibroids   . Low blood potassium 2021  . Migraines    menstrual migraine wiht aura    Patient Active Problem List   Diagnosis Date Noted  . Vomiting 11/17/2016  . Hypokalemia 11/17/2016  . Cannabis hyperemesis syndrome concurrent with and due to cannabis abuse ([place]) 11/09/2016    Past Surgical History:  Procedure Laterality Date  . ABDOMINAL HYSTERECTOMY    . broken hand Left    pins placed in left hand  . NO PAST SURGERIES       OB History    Gravida  0   Para  0   Term  0   Preterm  0   AB  0   Living  0     SAB  0   IAB  0   Ectopic  0   Multiple  0   Live Births  0            Family History  Problem Relation Age of Onset  . Diabetes Mother   . Breast cancer Mother        Pt.thinks she may have had br.ca  . Hypertension Mother   . Diabetes Other   . Hypertension Maternal Grandmother     Social History   Tobacco Use  . Smoking status: Former Smoker    Types: Cigarettes  . Smokeless tobacco: Never Used  . Tobacco comment: 11/17/2016 "someday smoker when I did smoke"  Vaping Use  . Vaping Use: Former  Substance Use Topics  . Alcohol use: Yes    Comment: occassionly  . Drug use: Yes    Types: Marijuana    Comment: Hx of marijuana use    Home Medications Prior to Admission medications   Medication Sig Start Date End Date Taking? Authorizing Provider  acetaminophen (TYLENOL) 325 MG tablet Take 650 mg by mouth every 6 (six) hours as needed for mild pain, fever or headache.    [provider]  capsicum (ZOSTRIX) 0.075 % topical cream Apply 1 application topically 4 (four) times  daily as needed. This medication can help with pain and vomiting from marijuana use.  Try applying it to 4 times a day to your abdomen to help symptoms. 06/17/20   Charlesetta Shanks, MD  omeprazole (PRILOSEC) 20 MG capsule Take 1 capsule (20 mg total) by mouth daily. 06/17/20   Charlesetta Shanks, MD  ondansetron (ZOFRAN ODT) 4 MG disintegrating tablet Take 1-2 tablets (4-8 mg total) by mouth every 8 (eight) hours as needed for nausea or vomiting. Patient not taking: No sig reported 06/15/20   Tedd Sias, PA  promethazine (PHENERGAN) 25 MG suppository Place 1 suppository (25 mg total) rectally every 6 (six) hours as needed for nausea or vomiting. 06/17/20   Charlesetta Shanks, MD  pantoprazole (PROTONIX) 20 MG tablet Take 1 tablet (20 mg total) by mouth daily. Patient not taking: Reported on 12/28/2019 06/22/19 12/28/19  Muthersbaugh, [name], PA-C  potassium chloride (KLOR-CON) 10 MEQ tablet Take 1 tablet (10 mEq total) by mouth 2 (two) times daily. Patient not taking:  Reported on 12/28/2019 08/31/19 12/28/19  Ezequiel Essex, MD  prochlorperazine (COMPAZINE) 25 MG suppository Place 1 suppository (25 mg total) rectally every 12 (twelve) hours as needed for nausea or vomiting. Patient not taking: Reported on 04/04/2017 11/08/16 06/22/19  Lorin Glass, PA-C  sucralfate (CARAFATE) 1 g tablet Take 1 tablet (1 g total) by mouth 4 (four) times daily -  with meals and at bedtime. Patient not taking: Reported on 12/28/2019 06/22/19 12/28/19  Muthersbaugh, [name], PA-C    Allergies    Cabbage and Shrimp [shellfish allergy]  Review of Systems   Review of Systems  All other systems reviewed and are negative.   Physical Exam Updated Vital Signs BP (!) 172/112   Pulse 86   Temp 98.2 F (36.8 C) (Oral)   Resp 18   Ht 1.524 m (5')   Wt 61.7 kg   LMP 08/29/2019   SpO2 98%   BMI 26.56 kg/m   Physical Exam Vitals and nursing note reviewed.  Constitutional:      General: She is not in acute distress.    Appearance: She is well-developed and well-nourished.  HENT:     Head: Normocephalic and atraumatic.     Right Ear: External ear normal.     Left Ear: External ear normal.  Eyes:     General: No scleral icterus.       Right eye: No discharge.        Left eye: No discharge.     Conjunctiva/sclera: Conjunctivae normal.  Neck:     Trachea: No tracheal deviation.  Cardiovascular:     Rate and Rhythm: Normal rate and regular rhythm.     Pulses: Intact distal pulses.  Pulmonary:     Effort: Pulmonary effort is normal. No respiratory distress.     Breath sounds: Normal breath sounds. No stridor. No wheezing or rales.  Abdominal:     General: Bowel sounds are normal. There is no distension.     Palpations: Abdomen is soft.     Tenderness: There is no abdominal tenderness. There is no guarding or rebound.  Musculoskeletal:        General: No tenderness or edema.     Cervical back: Neck supple.  Skin:    General: Skin is warm and dry.     Findings: No  rash.  Neurological:     Mental Status: She is alert.     Cranial Nerves: No cranial nerve deficit (no facial droop, extraocular movements  intact, no slurred speech).     Sensory: No sensory deficit.     Motor: No abnormal muscle tone or seizure activity.     Coordination: Coordination normal.     Deep Tendon Reflexes: Strength normal.  Psychiatric:        Mood and Affect: Mood and affect normal.     ED Results / Procedures / Treatments   Labs (all labs ordered are listed, but only abnormal results are displayed) Labs Reviewed  CBC - Abnormal; Notable for the following components:      Result Value   RDW 11.2 (*)    All other components within normal limits  LIPASE, BLOOD  COMPREHENSIVE METABOLIC PANEL  URINALYSIS, ROUTINE W REFLEX MICROSCOPIC  PREGNANCY, URINE    EKG None  Radiology No results found.  Procedures Procedures (including critical care time)  Medications Ordered in ED Medications  haloperidol lactate (HALDOL) injection 2 mg (has no administration in time range)  LORazepam (ATIVAN) injection 1 mg (has no administration in time range)  ondansetron (ZOFRAN) injection 4 mg (has no administration in time range)  sodium chloride 0.9 % bolus 1,000 mL (has no administration in time range)    Followed by  0.9 %  sodium chloride infusion (has no administration in time range)    ED Course  I have reviewed the triage vital signs and the nursing notes.  Pertinent labs & imaging results that were available during my care of the patient were reviewed by me and considered in my medical decision making (see chart for details).  Clinical Course as of 06/20/20 0720  Fri Jun 19, 2020  1451 Labs reviewed.  CBC normal.  Metabolic panel notable for hypokalemia [JK]  1452 Urinalysis does show elevated ketones. [JK]    Clinical Course User Index [JK] Dorie Rank, MD   MDM Rules/Calculators/A&P                          Patient presented to ED for evaluation of  recurrent abdominal pain and vomiting.  Patient has history of recurrent episodes of abdominal pain.  Several visits to the ED in the past week.  Recent CT scan did not show any evidence of acute abnormality.  On exam abdomen was soft.  IV fluids were ordered.  Patient was also treated with antiemetics, Ativan and Haldol which did help during the past.  Patient was noted to be hypokalemic likely related to her episodes of vomiting.  IV and oral potassium was also ordered.  Doubt acute infection or obstruction considering her laboratory tests and recent CT scan.  Plan was to continue fluids and p.o. challenge.  Patient was getting her potassium infusion at the time of shift change.  Care turned over to Dr. Sedonia Small.  Anticipate dc if pt is tolerating po fluids. Final Clinical Impression(s) / ED Diagnoses Final diagnoses:  Nausea and vomiting, intractability of vomiting not specified, unspecified vomiting type  Hypokalemia     Dorie Rank, MD 06/20/20 [phone]

## 2020-06-19 NOTE — ED Notes (Signed)
Pt wanting to leave at this time, stating that her roommate is locked out and she must leave to let her in. Explained to pt that she would be leaving AMA and that her treatment was not complete. Pt continues to voice that she has to leave, MD notified. Pt instructed not drive as she was given ativan and haldol. Pt voiced understanding however insisting that she leave. Pt unhooked from monitoring devices, IV removed and pt is preparing to leave AMA.

## 2020-06-19 NOTE — ED Provider Notes (Signed)
  Provider Note MRN:  [id number]  Arrival date & time: 06/19/20    ED Course and Medical Decision Making  Assumed care from Dr. Tomi Bamberger at shift change.  Persistent nausea vomiting, history of similar ED visits.  Recent CT imaging 4 days ago was reassuring and she presented with pain at that time.  Largely without abdominal pain or tenderness today.  Awaiting potassium infusion, reassessment after Haldol.  Candidate for discharge.  Shortly after receiving Haldol, Ativan, Zofran, patient explained that she had an emergency and had to leave.  Advised to stay, informed of the risks of leaving.  She was made well aware that she is a risk to herself and others if she were to operate a car at this time.  Still she left the hospital [place].  Procedures  Final Clinical Impressions(s) / ED Diagnoses     ICD-10-CM   1. Nausea and vomiting, intractability of vomiting not specified, unspecified vomiting type  R11.2   2. Hypokalemia  E87.6     ED Discharge Orders    None      Discharge Instructions   None     Barth Kirks. Sedonia Small, [place] [email or web address]    Maudie Flakes, MD 06/19/20 2308

## 2020-12-22 ENCOUNTER — Emergency Department (HOSPITAL_COMMUNITY)
Admission: EM | Admit: 2020-12-22 | Discharge: 2020-12-22 | Disposition: A | Discharge status: Home / Self Care | Payer: 59 | Attending: Emergency Medicine | Admitting: Emergency Medicine

## 2020-12-22 ENCOUNTER — Encounter (HOSPITAL_COMMUNITY): Payer: Self-pay | Admitting: Emergency Medicine

## 2020-12-22 DIAGNOSIS — R1084 Generalized abdominal pain: Secondary | ICD-10-CM | POA: Diagnosis not present

## 2020-12-22 DIAGNOSIS — R197 Diarrhea, unspecified: Secondary | ICD-10-CM | POA: Diagnosis not present

## 2020-12-22 DIAGNOSIS — J45909 Unspecified asthma, uncomplicated: Secondary | ICD-10-CM | POA: Insufficient documentation

## 2020-12-22 DIAGNOSIS — Z87891 Personal history of nicotine dependence: Secondary | ICD-10-CM | POA: Insufficient documentation

## 2020-12-22 DIAGNOSIS — R112 Nausea with vomiting, unspecified: Secondary | ICD-10-CM

## 2020-12-22 LAB — CBC
HCT: 40.7 % (ref 36.0–46.0)
Hemoglobin: 12.9 g/dL (ref 12.0–15.0)
MCH: 31.1 pg (ref 26.0–34.0)
MCHC: 31.7 g/dL (ref 30.0–36.0)
MCV: 98.1 fL (ref 80.0–100.0)
Platelets: 346 10*3/uL (ref 150–400)
RBC: 4.15 MIL/uL (ref 3.87–5.11)
RDW: 11.8 % (ref 11.5–15.5)
WBC: 7.1 10*3/uL (ref 4.0–10.5)
nRBC: 0 % (ref 0.0–0.2)

## 2020-12-22 LAB — COMPREHENSIVE METABOLIC PANEL
ALT: 22 U/L (ref 0–44)
AST: 35 U/L (ref 15–41)
Albumin: 4.3 g/dL (ref 3.5–5.0)
Alkaline Phosphatase: 41 U/L (ref 38–126)
Anion gap: 13 (ref 5–15)
BUN: 10 mg/dL (ref 6–20)
CO2: 21 mmol/L — ABNORMAL LOW (ref 22–32)
Calcium: 9.5 mg/dL (ref 8.9–10.3)
Chloride: 108 mmol/L (ref 98–111)
Creatinine, Ser: 0.96 mg/dL (ref 0.44–1.00)
GFR, Estimated: >60 mL/min (ref >60)
Glucose, Bld: 123 mg/dL — ABNORMAL HIGH (ref 70–99)
Potassium: 3.5 mmol/L (ref 3.5–5.1)
Sodium: 142 mmol/L (ref 135–145)
Total Bilirubin: 0.6 mg/dL (ref 0.3–1.2)
Total Protein: 8.2 g/dL — ABNORMAL HIGH (ref 6.5–8.1)

## 2020-12-22 LAB — LIPASE, BLOOD: Lipase: 26 U/L (ref 11–51)

## 2020-12-22 MED ORDER — ONDANSETRON HCL 4 MG/2ML IJ SOLN
4.0000 mg | Freq: Once | INTRAMUSCULAR | Status: AC
  Administered 2020-12-22: 13:00:00 4 mg via INTRAVENOUS
  Filled 2020-12-22: qty 2

## 2020-12-22 MED ORDER — ONDANSETRON 4 MG PO TBDP: 4.0000 mg | ORAL_TABLET | Freq: Three times a day (TID) | ORAL | 0 refills | Status: DC | PRN

## 2020-12-22 MED ORDER — FAMOTIDINE IN NACL 20-0.9 MG/50ML-% IV SOLN
20.0000 mg | Freq: Once | INTRAVENOUS | Status: AC
  Administered 2020-12-22: 13:00:00 20 mg via INTRAVENOUS
  Filled 2020-12-22: qty 50

## 2020-12-22 MED ORDER — SODIUM CHLORIDE 0.9 % IV BOLUS
2000.0000 mL | Freq: Once | INTRAVENOUS | Status: AC
  Administered 2020-12-22: 13:00:00 2000 mL via INTRAVENOUS

## 2020-12-22 MED ORDER — LORAZEPAM 2 MG/ML IJ SOLN
1.0000 mg | Freq: Once | INTRAMUSCULAR | Status: AC
  Administered 2020-12-22: 13:00:00 1 mg via INTRAVENOUS
  Filled 2020-12-22: qty 1

## 2020-12-22 MED ORDER — HALOPERIDOL LACTATE 5 MG/ML IJ SOLN
2.0000 mg | Freq: Once | INTRAMUSCULAR | Status: AC
  Administered 2020-12-22: 13:00:00 2 mg via INTRAVENOUS
  Filled 2020-12-22: qty 1

## 2020-12-22 MED ORDER — SUCRALFATE 1 GM/10ML PO SUSP: 1.0000 g | Freq: Three times a day (TID) | ORAL | 0 refills | Status: DC

## 2020-12-22 NOTE — ED Triage Notes (Signed)
C/o severe L sided abd pain, nausea, vomiting, and diarrhea since 5am.

## 2020-12-22 NOTE — Discharge Instructions (Addendum)
   We are sending you home with the following medications to help with your symptoms:   - Carafate- please take prior to each meal and prior to bedtime to help with stomach acidity/pain.  - Zofran- please take every 8 hours as needed for nausea/vomiting.   We have prescribed you new medication(s) today. Discuss the medications prescribed today with your pharmacist as they can have adverse effects and interactions with your other medicines including over the counter and prescribed medications. Seek medical evaluation if you start to experience new or abnormal symptoms after taking one of these medicines, seek care immediately if you start to experience difficulty breathing, feeling of your throat closing, facial swelling, or rash as these could be indications of a more serious allergic reaction  Please follow attached diet guidelines.  Please continue to avoid marijuana use.   Follow up with your primary care provider within 3 days for re-evaluation.  Return to the ER for new or worsening symptoms including but not limited to worsened pain, new pain, inability to keep fluids down, blood in vomit/stool, passing out, or any other concerns.   Your blood pressure was elevated in the ER please have this rechecked by primary care within 1 week.

## 2020-12-22 NOTE — ED Provider Notes (Signed)
[facility] EMERGENCY DEPARTMENT Provider Note   CSN: [id number] Arrival date & time: 12/22/20  1136     History Chief Complaint  Patient presents with   Emesis    Carrie Guerra is a 35 y.o. female with a history of migraines, fibroids, asthma, cannabis hyperemesis syndrome and prior abdominal hysterectomy who presents to the emergency department with complaints of N/V that began early this morning. Patient reports too numerous to count episodes of non bloody emesis, a few episodes of loose stools, and LUQ abdominal pain. Pain is constant, no alleviating/aggravating factors. States sxs feel similar to prior ED visits. She last smoked marijuana a couple weeks ago. Denies fever,chills, hematemesis, melena, hematochezia, dysuria, vaginal bleeding, vaginal discharge, or chest pain. Denies recent abx or foreign travel.   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 ([place]) 11/09/2016    Past Surgical History:  Procedure Laterality Date   ABDOMINAL HYSTERECTOMY     broken hand Left    pins placed in left hand   NO PAST SURGERIES       OB History     Gravida  0   Para  0   Term  0   Preterm  0   AB  0   Living  0      SAB  0   IAB  0   Ectopic  0   Multiple  0   Live Births  0           Family History  Problem Relation Age of Onset   Diabetes Mother    Breast cancer Mother        Pt.thinks she may have had br.ca   Hypertension Mother    Diabetes Other    Hypertension Maternal Grandmother      Social History   Tobacco Use   Smoking status: Former    Pack years: 0.00    Types: Cigarettes   Smokeless tobacco: Never   Tobacco comments:    11/17/2016 "someday smoker when I did smoke"  Vaping Use   Vaping Use: Former  Substance Use Topics   Alcohol use: Yes    Comment: occassionly   Drug use: Yes    Types: Marijuana    Comment: Hx of marijuana use    Home Medications Prior to Admission medications   Medication Sig Start Date End Date Taking? Authorizing Provider  acetaminophen (TYLENOL) 325 MG tablet Take 650 mg by mouth every 6 (six) hours as needed for mild pain, fever or headache.    [provider]  capsicum (ZOSTRIX) 0.075 % topical cream Apply 1 application topically 4 (four) times daily as needed. This medication can help with pain and vomiting from marijuana use.  Try applying it to 4 times a day to your abdomen to help symptoms. 06/17/20   Charlesetta Shanks, MD  omeprazole (PRILOSEC) 20 MG capsule Take 1 capsule (20 mg total) by mouth daily. 06/17/20   Charlesetta Shanks,  MD  ondansetron (ZOFRAN ODT) 4 MG disintegrating tablet Take 1-2 tablets (4-8 mg total) by mouth every 8 (eight) hours as needed for nausea or vomiting. Patient not taking: No sig reported 06/15/20   Tedd Sias, PA  promethazine (PHENERGAN) 25 MG suppository Place 1 suppository (25 mg total) rectally every 6 (six) hours as needed for nausea or vomiting. 06/17/20   Charlesetta Shanks, MD  pantoprazole (PROTONIX) 20 MG tablet Take 1 tablet (20 mg total) by mouth daily. Patient not taking: Reported on 12/28/2019 06/22/19 12/28/19  Muthersbaugh, [name], PA-C  potassium chloride (KLOR-CON) 10 MEQ tablet Take 1 tablet (10 mEq total) by mouth 2 (two) times daily. Patient not taking: Reported on 12/28/2019 08/31/19 12/28/19  Ezequiel Essex, MD  prochlorperazine (COMPAZINE) 25 MG suppository Place 1 suppository (25 mg total) rectally every 12 (twelve) hours as needed for nausea or vomiting. Patient not  taking: Reported on 04/04/2017 11/08/16 06/22/19  Lorin Glass, PA-C  sucralfate (CARAFATE) 1 g tablet Take 1 tablet (1 g total) by mouth 4 (four) times daily -  with meals and at bedtime. Patient not taking: Reported on 12/28/2019 06/22/19 12/28/19  Muthersbaugh, [name], PA-C    Allergies    Cabbage and Shrimp [shellfish allergy]  Review of Systems   Review of Systems  Constitutional:  Negative for chills and fever.  Respiratory:  Negative for shortness of breath.   Cardiovascular:  Negative for chest pain.  Gastrointestinal:  Positive for abdominal pain, diarrhea, nausea and vomiting. Negative for anal bleeding, blood in stool and constipation.  Genitourinary:  Negative for dysuria, vaginal bleeding and vaginal discharge.  Neurological:  Negative for syncope.  All other systems reviewed and are negative.  Physical Exam Updated Vital Signs BP (!) 163/108 (BP Location: Left Arm)   Pulse 78   Temp 97.7 F (36.5 C)   Resp 17   LMP 08/29/2019   SpO2 100%   Physical Exam Vitals and nursing note reviewed.  Constitutional:      Appearance: She is well-developed. She is not toxic-appearing.     Comments: Actively vomiting on exam.   HENT:     Head: Normocephalic and atraumatic.  Eyes:     General:        Right eye: No discharge.        Left eye: No discharge.     Conjunctiva/sclera: Conjunctivae normal.  Cardiovascular:     Rate and Rhythm: Normal rate and regular rhythm.  Pulmonary:     Effort: Pulmonary effort is normal. No respiratory distress.     Breath sounds: Normal breath sounds. No wheezing, rhonchi or rales.  Abdominal:     General: There is no distension.     Palpations: Abdomen is soft.     Tenderness: There is abdominal tenderness (mild, generalized). There is no right CVA tenderness, left CVA tenderness, guarding or rebound.     Comments: Negative murphys and mcburneys.    Musculoskeletal:     Cervical back: Neck supple.  Skin:    General: Skin is warm  and dry.     Findings: No rash.  Neurological:     Mental Status: She is alert.     Comments: Clear speech.   Psychiatric:        Behavior: Behavior normal.    ED Results / Procedures / Treatments   Labs (all labs ordered are listed, but only abnormal results are displayed) Labs Reviewed  COMPREHENSIVE METABOLIC PANEL - Abnormal; Notable for the following components:  Result Value   CO2 21 (*)    Glucose, Bld 123 (*)    Total Protein 8.2 (*)    All other components within normal limits  LIPASE, BLOOD  CBC  URINALYSIS, ROUTINE W REFLEX MICROSCOPIC    EKG None  Radiology No results found.  Procedures Procedures   Medications Ordered in ED Medications - No data to display  ED Course  I have reviewed the triage vital signs and the nursing notes.  Pertinent labs & imaging results that were available during my care of the patient were reviewed by me and considered in my medical decision making (see chart for details).    MDM Rules/Calculators/A&P                          Patient presents to the ED with complaints of abdominal pain. Patient nontoxic appearing, vitals w/ elevated BP- low suspicion for HTN emergency. On exam patient tender to the generalized abdomen, no peritoneal signs. Will evaluate with labs. EKG with QTC < 450- haldol, zofran, & ativan ordered with fluids & pepcid.   Additional history obtained:  Additional history obtained from chart review & nursing note review.  Prior ED visits for similar w/ reassuring CT imaging.   Lab Tests:  I reviewed and interpreted labs, which included:  CBC: unremarkable.  CMP: No significant electrolyte derangement, mildly low bicarb, normal anion gap.  Lipase: WNL Preg test: Deferred S/p hysterectomy.    ED Course:   13:25: RE-EVAL: Patient feeling much better, she is asking to be discharged. Will PO challenge at this time.   On repeat abdominal exam patient remains without peritoneal signs, low suspicion for  cholecystitis, pancreatitis, diverticulitis, appendicitis, bowel obstruction/perforation, or other acute surgical process. Patient tolerating PO in the emergency department. Will discharge home with supportive measures. Some documented tachypneic respiratory rates- patient has not had any tachypnea on my assessments S/p antiemetics, no signs of respiratory distress, appears appropriate for discharge. I discussed results, treatment plan, need for PCP follow-up, and return precautions with the patient. Provided opportunity for questions, patient confirmed understanding and is in agreement with plan.   Portions of this note were generated with [employer]. Dictation errors may occur despite best attempts at proofreading.   Final Clinical Impression(s) / ED Diagnoses Final diagnoses:  Non-intractable vomiting with nausea, unspecified vomiting type    Rx / DC Orders ED Discharge Orders          Ordered    sucralfate (CARAFATE) 1 GM/10ML suspension  3 times daily with meals & bedtime        12/22/20 1356    ondansetron (ZOFRAN ODT) 4 MG disintegrating tablet  Every 8 hours PRN        12/22/20 1356             Rome Schlauch, [place], PA-C 12/22/20 1359    Dorie Rank, MD 12/23/20 1425

## 2020-12-25 ENCOUNTER — Other Ambulatory Visit: Payer: Self-pay

## 2020-12-25 ENCOUNTER — Encounter (HOSPITAL_BASED_OUTPATIENT_CLINIC_OR_DEPARTMENT_OTHER): Payer: Self-pay | Admitting: *Deleted

## 2020-12-25 ENCOUNTER — Emergency Department (HOSPITAL_BASED_OUTPATIENT_CLINIC_OR_DEPARTMENT_OTHER)
Admission: EM | Admit: 2020-12-25 | Discharge: 2020-12-25 | Disposition: A | Discharge status: Home / Self Care | Payer: 59 | Attending: Emergency Medicine | Admitting: Emergency Medicine

## 2020-12-25 ENCOUNTER — Ambulatory Visit (HOSPITAL_COMMUNITY)
Admission: EM | Admit: 2020-12-25 | Discharge: 2020-12-25 | Disposition: A | Discharge status: Home / Self Care | Payer: 59

## 2020-12-25 DIAGNOSIS — R1084 Generalized abdominal pain: Secondary | ICD-10-CM | POA: Insufficient documentation

## 2020-12-25 DIAGNOSIS — R197 Diarrhea, unspecified: Secondary | ICD-10-CM | POA: Insufficient documentation

## 2020-12-25 DIAGNOSIS — Z87891 Personal history of nicotine dependence: Secondary | ICD-10-CM | POA: Diagnosis not present

## 2020-12-25 DIAGNOSIS — J45909 Unspecified asthma, uncomplicated: Secondary | ICD-10-CM | POA: Diagnosis not present

## 2020-12-25 DIAGNOSIS — R1115 Cyclical vomiting syndrome unrelated to migraine: Secondary | ICD-10-CM | POA: Insufficient documentation

## 2020-12-25 DIAGNOSIS — R1110 Vomiting, unspecified: Secondary | ICD-10-CM | POA: Diagnosis present

## 2020-12-25 LAB — CBC
HCT: 38.6 % (ref 36.0–46.0)
Hemoglobin: 12.9 g/dL (ref 12.0–15.0)
MCH: 30.9 pg (ref 26.0–34.0)
MCHC: 33.4 g/dL (ref 30.0–36.0)
MCV: 92.6 fL (ref 80.0–100.0)
Platelets: 374 10*3/uL (ref 150–400)
RBC: 4.17 MIL/uL (ref 3.87–5.11)
RDW: 11.7 % (ref 11.5–15.5)
WBC: 8.8 10*3/uL (ref 4.0–10.5)
nRBC: 0 % (ref 0.0–0.2)

## 2020-12-25 LAB — COMPREHENSIVE METABOLIC PANEL
ALT: 20 U/L (ref 0–44)
AST: 22 U/L (ref 15–41)
Albumin: 4.9 g/dL (ref 3.5–5.0)
Alkaline Phosphatase: 44 U/L (ref 38–126)
Anion gap: 13 (ref 5–15)
BUN: 10 mg/dL (ref 6–20)
CO2: 24 mmol/L (ref 22–32)
Calcium: 10.3 mg/dL (ref 8.9–10.3)
Chloride: 104 mmol/L (ref 98–111)
Creatinine, Ser: 0.98 mg/dL (ref 0.44–1.00)
GFR, Estimated: >60 mL/min (ref >60)
Glucose, Bld: 132 mg/dL — ABNORMAL HIGH (ref 70–99)
Potassium: 2.9 mmol/L — ABNORMAL LOW (ref 3.5–5.1)
Sodium: 141 mmol/L (ref 135–145)
Total Bilirubin: 1.2 mg/dL (ref 0.3–1.2)
Total Protein: 9.1 g/dL — ABNORMAL HIGH (ref 6.5–8.1)

## 2020-12-25 LAB — LIPASE, BLOOD: Lipase: 19 U/L (ref 11–51)

## 2020-12-25 MED ORDER — HYDROMORPHONE HCL 1 MG/ML IJ SOLN
1.0000 mg | Freq: Once | INTRAMUSCULAR | Status: AC
  Administered 2020-12-25: 1 mg via INTRAVENOUS
  Filled 2020-12-25: qty 1

## 2020-12-25 MED ORDER — POTASSIUM CHLORIDE CRYS ER 20 MEQ PO TBCR
40.0000 meq | EXTENDED_RELEASE_TABLET | Freq: Once | ORAL | Status: AC
  Administered 2020-12-25: 40 meq via ORAL
  Filled 2020-12-25: qty 2

## 2020-12-25 MED ORDER — PROMETHAZINE HCL 25 MG/ML IJ SOLN
INTRAMUSCULAR | Status: AC
  Administered 2020-12-25: 25 mg via INTRAVENOUS
  Filled 2020-12-25: qty 1

## 2020-12-25 MED ORDER — ONDANSETRON HCL 4 MG/2ML IJ SOLN
4.0000 mg | Freq: Once | INTRAMUSCULAR | Status: AC | PRN
  Administered 2020-12-25: 4 mg via INTRAVENOUS
  Filled 2020-12-25: qty 2

## 2020-12-25 MED ORDER — SODIUM CHLORIDE 0.9 % IV SOLN
25.0000 mg | Freq: Four times a day (QID) | INTRAVENOUS | Status: DC | PRN
  Filled 2020-12-25: qty 1

## 2020-12-25 MED ORDER — DROPERIDOL 2.5 MG/ML IJ SOLN
2.5000 mg | Freq: Once | INTRAMUSCULAR | Status: AC
  Administered 2020-12-25: 2.5 mg via INTRAMUSCULAR
  Filled 2020-12-25: qty 2

## 2020-12-25 MED ORDER — POTASSIUM CHLORIDE CRYS ER 20 MEQ PO TBCR: 20.0000 meq | EXTENDED_RELEASE_TABLET | Freq: Two times a day (BID) | ORAL | 0 refills | Status: DC

## 2020-12-25 MED ORDER — PROMETHAZINE HCL 25 MG RE SUPP: 25.0000 mg | Freq: Four times a day (QID) | RECTAL | 0 refills | Status: DC | PRN

## 2020-12-25 NOTE — Discharge Instructions (Addendum)
Call your primary care doctor or specialist as discussed in the next 2-3 days.   Return immediately back to the ER if:  Your symptoms worsen within the next 12-24 hours. You develop new symptoms such as new fevers, persistent vomiting, new pain, shortness of breath, or new weakness or numbness, or if you have any other concerns.  

## 2020-12-25 NOTE — ED Notes (Signed)
Pt continues to scream and yell since arriving.

## 2020-12-25 NOTE — ED Provider Notes (Signed)
[facility] EMERGENCY DEPT Provider Note   CSN: [id number] Arrival date & time: 12/25/20  1359     History Chief Complaint  Patient presents with   Emesis   Abdominal Pain    Carrie Guerra is a 35 y.o. female.  Patient complaining of exacerbation of her chronic abdominal pain.  She states that she is had this pain for "years."  She had another episode of vomiting and abdominal pain again last night and persisted throughout the night and today.  She states that she has had too numerous to count amounts of vomiting.  Think she is had some diarrhea as well.  Otherwise denies any fevers or cough.  Describes abdominal pain as diffuse across her abdomen is similar to prior episodes.  She was seen in the ER yesterday in outside facility but continues to have pain today and presents to the ER here.  Clear history is difficult to obtain as the patient is rolling around in bed and answers intermittently.      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 ([place]) 11/09/2016    Past Surgical History:  Procedure Laterality Date   ABDOMINAL HYSTERECTOMY     broken hand Left    pins placed in left hand   NO PAST SURGERIES       OB History     Gravida  0   Para  0   Term  0   Preterm  0   AB  0   Living  0      SAB  0   IAB  0   Ectopic  0   Multiple  0   Live Births  0           Family History  Problem Relation Age of Onset   Diabetes Mother    Breast cancer Mother        Pt.thinks she may have had br.ca    Hypertension Mother    Diabetes Other    Hypertension Maternal Grandmother     Social History   Tobacco Use   Smoking status: Former    Pack years: 0.00    Types: Cigarettes   Smokeless tobacco: Never   Tobacco comments:    11/17/2016 "someday smoker when I did smoke"  Vaping Use   Vaping Use: Former  Substance Use Topics   Alcohol use: Yes    Comment: occassionly   Drug use: Yes    Types: Marijuana    Comment: last use was yesterday    Home Medications Prior to Admission medications   Medication Sig Start Date End Date Taking? Authorizing Provider  potassium chloride SA (KLOR-CON) 20 MEQ tablet Take 1 tablet (20 mEq total) by mouth 2 (two) times daily for 3 days. 12/25/20 12/28/20 Yes Mercades Bajaj, Greggory Brandy, MD  promethazine (PHENERGAN) 25 MG suppository Place 1 suppository (25 mg total) rectally every 6 (six) hours as needed for nausea or vomiting. 12/25/20  Yes Khari Lett, Greggory Brandy, MD  acetaminophen (TYLENOL)  325 MG tablet Take 650 mg by mouth every 6 (six) hours as needed for mild pain, fever or headache.    [provider]  capsicum (ZOSTRIX) 0.075 % topical cream Apply 1 application topically 4 (four) times daily as needed. This medication can help with pain and vomiting from marijuana use.  Try applying it to 4 times a day to your abdomen to help symptoms. 06/17/20   Charlesetta Shanks, MD  omeprazole (PRILOSEC) 20 MG capsule Take 1 capsule (20 mg total) by mouth daily. 06/17/20   Charlesetta Shanks, MD  ondansetron (ZOFRAN ODT) 4 MG disintegrating tablet Take 1 tablet (4 mg total) by mouth every 8 (eight) hours as needed for nausea or vomiting. 12/22/20   Petrucelli, Aldona Bar R, PA-C  sucralfate (CARAFATE) 1 GM/10ML suspension Take 10 mLs (1 g total) by mouth 4 (four) times daily -  with meals and at bedtime. 12/22/20   Petrucelli, Samantha R, PA-C  pantoprazole (PROTONIX) 20 MG tablet Take 1 tablet (20 mg total) by mouth daily. Patient not taking: Reported on 12/28/2019 06/22/19 12/28/19   Muthersbaugh, [name], PA-C  potassium chloride (KLOR-CON) 10 MEQ tablet Take 1 tablet (10 mEq total) by mouth 2 (two) times daily. Patient not taking: Reported on 12/28/2019 08/31/19 12/28/19  Ezequiel Essex, MD  prochlorperazine (COMPAZINE) 25 MG suppository Place 1 suppository (25 mg total) rectally every 12 (twelve) hours as needed for nausea or vomiting. Patient not taking: Reported on 04/04/2017 11/08/16 06/22/19  Lorin Glass, PA-C    Allergies    Cabbage and Shrimp [shellfish allergy]  Review of Systems   Review of Systems  Constitutional:  Negative for fever.  HENT:  Negative for ear pain.   Eyes:  Negative for pain.  Respiratory:  Negative for cough.   Cardiovascular:  Negative for chest pain.  Gastrointestinal:  Positive for abdominal pain.  Genitourinary:  Negative for flank pain.  Musculoskeletal:  Negative for back pain.  Skin:  Negative for rash.  Neurological:  Negative for headaches.   Physical Exam Updated Vital Signs BP (!) 161/147 (BP Location: Right Arm)   Pulse 83   Resp (!) 26   Ht 5' (1.524 m)   Wt 61.2 kg   LMP 08/29/2019   SpO2 100%   BMI 26.37 kg/m   Physical Exam Constitutional:      General: She is not in acute distress.    Appearance: Normal appearance.  HENT:     Head: Normocephalic.     Nose: Nose normal.  Eyes:     Extraocular Movements: Extraocular movements intact.  Cardiovascular:     Rate and Rhythm: Normal rate.  Pulmonary:     Effort: Pulmonary effort is normal.  Abdominal:     Comments: Patient complaining of diffuse abdominal pain.  However no guarding or rebound, no localized tenderness on exam.  Musculoskeletal:        General: Normal range of motion.     Cervical back: Normal range of motion.  Neurological:     General: No focal deficit present.     Mental Status: She is alert. Mental status is at baseline.    ED Results / Procedures / Treatments   Labs (all labs ordered are listed, but only abnormal results are  displayed) Labs Reviewed  COMPREHENSIVE METABOLIC PANEL - Abnormal; Notable for the following components:      Result Value   Potassium 2.9 (*)    Glucose, Bld 132 (*)    Total Protein 9.1 (*)  All other components within normal limits  LIPASE, BLOOD  CBC  URINALYSIS, ROUTINE W REFLEX MICROSCOPIC  PREGNANCY, URINE    EKG None  Radiology No results found.  Procedures Procedures   Medications Ordered in ED Medications  ondansetron (ZOFRAN) injection 4 mg (4 mg Intravenous Given 12/25/20 1509)  droperidol (INAPSINE) 2.5 MG/ML injection 2.5 mg (2.5 mg Intramuscular Given 12/25/20 1509)  HYDROmorphone (DILAUDID) injection 1 mg (1 mg Intravenous Given 12/25/20 1551)  promethazine (PHENERGAN) 25 MG/ML injection (25 mg Intravenous Given 12/25/20 1551)  potassium chloride SA (KLOR-CON) CR tablet 40 mEq (40 mEq Oral Given 12/25/20 1603)    ED Course  I have reviewed the triage vital signs and the nursing notes.  Pertinent labs & imaging results that were available during my care of the patient were reviewed by me and considered in my medical decision making (see chart for details).    MDM Rules/Calculators/A&P                          Labs are sent.  White count appears normal.  Patient given IV medications.  Patient shows improvement on serial abdominal exams done.  Last exam done at 415 p.m. with no abdominal tenderness or guarding noted.  Patient tolerating p.o. potassium replacement here in the ER.  Given prescription of potassium to go home with as well as Phenergan suppositories.  Advise follow-up with GI within the week.  Advised immediate return for worsening symptoms or any additional concerns.    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

## 2020-12-25 NOTE — ED Triage Notes (Signed)
LUQ & LLQ since Sunday.  Went to [facility] on Tuesday for the same. Pain was still vomiting and still has an abdominal pain after discharge.  Came here for the same problem.

## 2020-12-28 ENCOUNTER — Encounter (HOSPITAL_COMMUNITY): Payer: Self-pay

## 2020-12-28 ENCOUNTER — Other Ambulatory Visit: Payer: Self-pay

## 2020-12-28 ENCOUNTER — Emergency Department (HOSPITAL_COMMUNITY)
Admission: EM | Admit: 2020-12-28 | Discharge: 2020-12-28 | Disposition: A | Discharge status: Home / Self Care | Payer: 59 | Attending: Emergency Medicine | Admitting: Emergency Medicine

## 2020-12-28 DIAGNOSIS — Z87891 Personal history of nicotine dependence: Secondary | ICD-10-CM | POA: Diagnosis not present

## 2020-12-28 DIAGNOSIS — R1115 Cyclical vomiting syndrome unrelated to migraine: Secondary | ICD-10-CM | POA: Insufficient documentation

## 2020-12-28 DIAGNOSIS — R112 Nausea with vomiting, unspecified: Secondary | ICD-10-CM | POA: Diagnosis present

## 2020-12-28 DIAGNOSIS — J45909 Unspecified asthma, uncomplicated: Secondary | ICD-10-CM | POA: Diagnosis not present

## 2020-12-28 LAB — COMPREHENSIVE METABOLIC PANEL
ALT: 24 U/L (ref 0–44)
AST: 32 U/L (ref 15–41)
Albumin: 4.2 g/dL (ref 3.5–5.0)
Alkaline Phosphatase: 41 U/L (ref 38–126)
Anion gap: 12 (ref 5–15)
BUN: 10 mg/dL (ref 6–20)
CO2: 21 mmol/L — ABNORMAL LOW (ref 22–32)
Calcium: 9.7 mg/dL (ref 8.9–10.3)
Chloride: 109 mmol/L (ref 98–111)
Creatinine, Ser: 1.05 mg/dL — ABNORMAL HIGH (ref 0.44–1.00)
GFR, Estimated: >60 mL/min (ref >60)
Glucose, Bld: 147 mg/dL — ABNORMAL HIGH (ref 70–99)
Potassium: 3.5 mmol/L (ref 3.5–5.1)
Sodium: 142 mmol/L (ref 135–145)
Total Bilirubin: 0.8 mg/dL (ref 0.3–1.2)
Total Protein: 7.9 g/dL (ref 6.5–8.1)

## 2020-12-28 LAB — CBC
HCT: 37 % (ref 36.0–46.0)
Hemoglobin: 12.2 g/dL (ref 12.0–15.0)
MCH: 31.4 pg (ref 26.0–34.0)
MCHC: 33 g/dL (ref 30.0–36.0)
MCV: 95.1 fL (ref 80.0–100.0)
Platelets: 360 10*3/uL (ref 150–400)
RBC: 3.89 MIL/uL (ref 3.87–5.11)
RDW: 11.7 % (ref 11.5–15.5)
WBC: 6.7 10*3/uL (ref 4.0–10.5)
nRBC: 0 % (ref 0.0–0.2)

## 2020-12-28 LAB — I-STAT BETA HCG BLOOD, ED (MC, WL, AP ONLY): I-stat hCG, quantitative: <5 m[IU]/mL (ref <5)

## 2020-12-28 LAB — LIPASE, BLOOD: Lipase: 28 U/L (ref 11–51)

## 2020-12-28 MED ORDER — ONDANSETRON 4 MG PO TBDP
4.0000 mg | ORAL_TABLET | Freq: Once | ORAL | Status: AC | PRN
  Administered 2020-12-28: 4 mg via ORAL
  Filled 2020-12-28: qty 1

## 2020-12-28 MED ORDER — HALOPERIDOL LACTATE 5 MG/ML IJ SOLN
5.0000 mg | Freq: Once | INTRAMUSCULAR | Status: AC
  Administered 2020-12-28: 5 mg via INTRAVENOUS
  Filled 2020-12-28: qty 1

## 2020-12-28 MED ORDER — KETOROLAC TROMETHAMINE 15 MG/ML IJ SOLN
15.0000 mg | Freq: Once | INTRAMUSCULAR | Status: AC
  Administered 2020-12-28: 15 mg via INTRAVENOUS
  Filled 2020-12-28: qty 1

## 2020-12-28 MED ORDER — ALUM & MAG HYDROXIDE-SIMETH 200-200-20 MG/5ML PO SUSP
30.0000 mL | Freq: Once | ORAL | Status: AC
  Administered 2020-12-28: 30 mL via ORAL
  Filled 2020-12-28: qty 30

## 2020-12-28 MED ORDER — LIDOCAINE VISCOUS HCL 2 % MT SOLN
15.0000 mL | Freq: Once | OROMUCOSAL | Status: AC
  Administered 2020-12-28: 15 mL via ORAL
  Filled 2020-12-28: qty 15

## 2020-12-28 MED ORDER — LACTATED RINGERS IV BOLUS
1000.0000 mL | Freq: Once | INTRAVENOUS | Status: AC
  Administered 2020-12-28: 1000 mL via INTRAVENOUS

## 2020-12-28 NOTE — ED Notes (Signed)
Gave pt ice chips 

## 2020-12-28 NOTE — ED Notes (Signed)
Patient moaning and yelling in room. Visitor at bedside.

## 2020-12-28 NOTE — ED Triage Notes (Signed)
Patient reports that she awoke with abdominal cramping with vomiting and diarrhea this am. No fever, no chills. NAD

## 2020-12-28 NOTE — ED Provider Notes (Signed)
[facility] EMERGENCY DEPARTMENT Provider Note   CSN: [id number] Arrival date & time: 12/28/20  0827     History No chief complaint on file.   Carrie Guerra is a 35 y.o. female.  HPI     35 year old female comes in with chief complaint of nausea, vomiting and abdominal pain.  She has had cyclic vomiting syndrome in the past.  Patient reports that she felt okay yesterday evening, but started having bouts of emesis again this morning.  She has been seen in the ER frequently over the past few days.  Family at the bedside.  They do indicate that patient has been informed that her hyperemesis could be because of cannabis use.   Patient denies any burning with urination, blood in the urine, vaginal discharge.  There is no history of kidney stones, gallstones.  Pain is generalized and similar to her previous pain attacks.  Several episodes of emesis prior to ED arrival and an episode of loose BM as well.  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 ([place]) 11/09/2016   Elevated bilirubin 2021   Elevated lipase 2021   Fibroid    Fibroids    Low blood potassium 2021   Migraines    menstrual migraine wiht aura    Patient Active Problem List   Diagnosis Date Noted   Vomiting 11/17/2016   Hypokalemia 11/17/2016   Cannabis hyperemesis syndrome concurrent with and due to cannabis abuse ([place]) 11/09/2016    Past Surgical History:  Procedure Laterality Date   ABDOMINAL HYSTERECTOMY     broken hand Left    pins placed in left hand   NO PAST SURGERIES       OB History     Gravida  0   Para  0   Term  0   Preterm  0   AB  0   Living  0      SAB  0   IAB  0   Ectopic  0   Multiple  0   Live Births  0           Family History  Problem Relation Age of Onset   Diabetes Mother    Breast cancer Mother        Pt.thinks she may have had br.ca    Hypertension Mother    Diabetes Other    Hypertension Maternal Grandmother     Social History   Tobacco Use   Smoking status: Former    Pack years: 0.00    Types: Cigarettes   Smokeless tobacco: Never   Tobacco comments:    11/17/2016 "someday smoker when I did smoke"  Vaping Use   Vaping Use: Former  Substance Use Topics   Alcohol use: Yes    Comment: occassionly   Drug use: Yes    Types: Marijuana    Comment: last use was yesterday    Home Medications Prior to Admission medications   Medication Sig Start Date End Date Taking? Authorizing Provider  acetaminophen (TYLENOL) 325 MG tablet Take 650 mg by mouth every 6 (six) hours as needed for mild pain, fever or headache.    [provider]  capsicum (ZOSTRIX) 0.075 % topical cream Apply 1 application topically 4 (four) times daily as needed. This medication can help with pain and vomiting from marijuana use.  Try applying it to 4 times a  day to your abdomen to help symptoms. 06/17/20   Charlesetta Shanks, MD  omeprazole (PRILOSEC) 20 MG capsule Take 1 capsule (20 mg total) by mouth daily. 06/17/20   Charlesetta Shanks, MD  ondansetron (ZOFRAN ODT) 4 MG disintegrating tablet Take 1 tablet (4 mg total) by mouth every 8 (eight) hours as needed for nausea or vomiting. 12/22/20   Petrucelli, Aldona Bar R, PA-C  potassium chloride SA (KLOR-CON) 20 MEQ tablet Take 1 tablet (20 mEq total) by mouth 2 (two) times daily for 3 days. 12/25/20 12/28/20  Luna Fuse, MD  promethazine (PHENERGAN) 25 MG suppository Place 1 suppository (25 mg total) rectally every 6 (six) hours as needed for nausea or vomiting. 12/25/20   Luna Fuse, MD  sucralfate (CARAFATE) 1 GM/10ML suspension Take 10 mLs (1 g total) by mouth 4 (four) times daily -  with meals and at bedtime. 12/22/20   Petrucelli, Samantha R, PA-C  pantoprazole (PROTONIX) 20 MG tablet Take 1 tablet (20 mg total) by mouth daily. Patient not taking: Reported on 12/28/2019 06/22/19 12/28/19   Muthersbaugh, [name], PA-C  potassium chloride (KLOR-CON) 10 MEQ tablet Take 1 tablet (10 mEq total) by mouth 2 (two) times daily. Patient not taking: Reported on 12/28/2019 08/31/19 12/28/19  Ezequiel Essex, MD  prochlorperazine (COMPAZINE) 25 MG suppository Place 1 suppository (25 mg total) rectally every 12 (twelve) hours as needed for nausea or vomiting. Patient not taking: Reported on 04/04/2017 11/08/16 06/22/19  Lorin Glass, PA-C    Allergies    Cabbage and Shrimp [shellfish allergy]  Review of Systems   Review of Systems  Constitutional:  Positive for activity change and diaphoresis.  Respiratory:  Negative for shortness of breath.   Cardiovascular:  Negative for chest pain.  Gastrointestinal:  Positive for abdominal distention, nausea and vomiting.  Neurological:  Positive for dizziness.  Hematological:  Does not bruise/bleed easily.  All other systems reviewed and are negative.  Physical Exam Updated Vital Signs BP (!) 159/104   Pulse 88   Temp 97.8 F (36.6 C)   Resp (!) 21   LMP 08/29/2019   SpO2 100%   Physical Exam Vitals and nursing note reviewed.  Constitutional:      General: She is in acute distress.     Appearance: She is well-developed. She is diaphoretic.  HENT:     Head: Atraumatic.     Mouth/Throat:     Mouth: Mucous membranes are dry.  Cardiovascular:     Rate and Rhythm: Normal rate.  Pulmonary:     Effort: Pulmonary effort is normal.  Abdominal:     Palpations: Abdomen is soft.     Tenderness: There is abdominal tenderness. There is no guarding or rebound.     Comments: Diffuse abdominal tenderness  Musculoskeletal:     Cervical back: Normal range of motion and neck supple.  Skin:    General: Skin is warm.  Neurological:     Mental Status: She is alert and oriented to person, place, and time.    ED Results / Procedures / Treatments   Labs (all labs ordered are listed, but only abnormal results are displayed) Labs Reviewed   COMPREHENSIVE METABOLIC PANEL - Abnormal; Notable for the following components:      Result Value   CO2 21 (*)    Glucose, Bld 147 (*)    Creatinine, Ser 1.05 (*)    All other components within normal limits  LIPASE, BLOOD  CBC  URINALYSIS, ROUTINE W REFLEX MICROSCOPIC  I-STAT BETA HCG BLOOD, ED ([facility], [facility], [facility] ONLY)    EKG EKG Interpretation  Date/Time:  Monday December 28 2020 09:28:12 EDT Ventricular Rate:  87 PR Interval:  197 QRS Duration: 75 QT Interval:  391 QTC Calculation: 471 R Axis:   78 Text Interpretation: Sinus rhythm Right atrial enlargement No acute changes No significant change since last tracing Confirmed by Varney Biles [phone]) on 12/28/2020 10:27:23 AM  Radiology No results found.  Procedures Procedures   Medications Ordered in ED Medications  ondansetron (ZOFRAN-ODT) disintegrating tablet 4 mg (4 mg Oral Given 12/28/20 0837)  haloperidol lactate (HALDOL) injection 5 mg (5 mg Intravenous Given 12/28/20 0934)  lactated ringers bolus 1,000 mL (0 mLs Intravenous Stopped 12/28/20 1142)  alum & mag hydroxide-simeth (MAALOX/MYLANTA) 200-200-20 MG/5ML suspension 30 mL (30 mLs Oral Given 12/28/20 1035)    And  lidocaine (XYLOCAINE) 2 % viscous mouth solution 15 mL (15 mLs Oral Given 12/28/20 1035)  ketorolac (TORADOL) 15 MG/ML injection 15 mg (15 mg Intravenous Given 12/28/20 1034)    ED Course  I have reviewed the triage vital signs and the nursing notes.  Pertinent labs & imaging results that were available during my care of the patient were reviewed by me and considered in my medical decision making (see chart for details).    MDM Rules/Calculators/A&P                           35 year old comes in with chief complaint of abdominal pain.  Pain is generalized, without any peritoneal findings as the abdomen is soft and there is no rebound.  Patient is uncomfortable appearing, diaphoretic.  She does look very dry.  We considered SBO, intra-abdominal surgical process  such as appendicitis, perforation, cholecystitis in the differential diagnosis along with gastroenteritis, cyclic vomiting syndrome.  Patient informs me that she has had similar bouts in the past, and her chart review reveals few ER visits in the recent past with similar complaints.  With this information, I think she is likely having cyclic vomiting syndrome possibly due to cannabinoid use.  Our goal ultimately will be to get the symptoms in better control.  Labs were sent, they are already appear reassuring  Reassessed into 2 separate times. Initially, she was assessed after she received Haldol.  Her symptoms had calm down, but not completely at that time.  We started oral challenge at that time and give her some more medications, and on reassessment around noon, patient is resting comfortably.  No emesis since initial treatment.  She has passed oral challenge.  Stable for discharge with strict ER return precautions.  Final Clinical Impression(s) / ED Diagnoses Final diagnoses:  Cyclic vomiting syndrome    Rx / DC Orders ED Discharge Orders     None        Varney Biles, MD 12/28/20 1213

## 2020-12-28 NOTE — ED Notes (Signed)
Pt is unable to pee at this time

## 2020-12-28 NOTE — Discharge Instructions (Addendum)
Recommend strict clear liquid diet for the next 2 days. We recommend refraining from any toxins -including even passive marijuana inhalation, alcohol consumption.  Please take the medications that have already been prescribed such as promethazine and Zofran for nausea and Carafate for abdominal discomfort.  Follow-up with your primary care doctor in 1 week.

## 2020-12-28 NOTE — ED Notes (Signed)
Pt d/c home per MD order. Discharge summary reviewed with pt, pt verbalizes understanding. Ambulatory off unit. Discharged home with visitor.

## 2021-01-01 ENCOUNTER — Encounter (HOSPITAL_COMMUNITY): Payer: Self-pay

## 2021-01-01 ENCOUNTER — Emergency Department (HOSPITAL_COMMUNITY): Payer: 59

## 2021-01-01 ENCOUNTER — Emergency Department (HOSPITAL_COMMUNITY)
Admission: EM | Admit: 2021-01-01 | Discharge: 2021-01-01 | Disposition: A | Discharge status: Home / Self Care | Payer: 59 | Attending: Emergency Medicine | Admitting: Emergency Medicine

## 2021-01-01 ENCOUNTER — Other Ambulatory Visit: Payer: Self-pay

## 2021-01-01 DIAGNOSIS — R1032 Left lower quadrant pain: Secondary | ICD-10-CM | POA: Insufficient documentation

## 2021-01-01 DIAGNOSIS — R112 Nausea with vomiting, unspecified: Secondary | ICD-10-CM | POA: Diagnosis not present

## 2021-01-01 DIAGNOSIS — Z20822 Contact with and (suspected) exposure to covid-19: Secondary | ICD-10-CM | POA: Diagnosis not present

## 2021-01-01 DIAGNOSIS — J45909 Unspecified asthma, uncomplicated: Secondary | ICD-10-CM | POA: Diagnosis not present

## 2021-01-01 DIAGNOSIS — Z87891 Personal history of nicotine dependence: Secondary | ICD-10-CM | POA: Insufficient documentation

## 2021-01-01 DIAGNOSIS — A09 Infectious gastroenteritis and colitis, unspecified: Secondary | ICD-10-CM

## 2021-01-01 DIAGNOSIS — R197 Diarrhea, unspecified: Secondary | ICD-10-CM | POA: Insufficient documentation

## 2021-01-01 LAB — COMPREHENSIVE METABOLIC PANEL
ALT: 34 U/L (ref 0–44)
AST: 35 U/L (ref 15–41)
Albumin: 5 g/dL (ref 3.5–5.0)
Alkaline Phosphatase: 43 U/L (ref 38–126)
Anion gap: 14 (ref 5–15)
BUN: 27 mg/dL — ABNORMAL HIGH (ref 6–20)
CO2: 21 mmol/L — ABNORMAL LOW (ref 22–32)
Calcium: 9.7 mg/dL (ref 8.9–10.3)
Chloride: 97 mmol/L — ABNORMAL LOW (ref 98–111)
Creatinine, Ser: 1.09 mg/dL — ABNORMAL HIGH (ref 0.44–1.00)
GFR, Estimated: >60 mL/min (ref >60)
Glucose, Bld: 116 mg/dL — ABNORMAL HIGH (ref 70–99)
Potassium: 2.9 mmol/L — ABNORMAL LOW (ref 3.5–5.1)
Sodium: 132 mmol/L — ABNORMAL LOW (ref 135–145)
Total Bilirubin: 1.5 mg/dL — ABNORMAL HIGH (ref 0.3–1.2)
Total Protein: 9.1 g/dL — ABNORMAL HIGH (ref 6.5–8.1)

## 2021-01-01 LAB — URINALYSIS, ROUTINE W REFLEX MICROSCOPIC
Glucose, UA: NEGATIVE mg/dL
Hgb urine dipstick: NEGATIVE
Ketones, ur: 20 mg/dL — AB
Leukocytes,Ua: NEGATIVE
Nitrite: NEGATIVE
Protein, ur: 100 mg/dL — AB
Specific Gravity, Urine: 1.032 — ABNORMAL HIGH (ref 1.005–1.030)
pH: 5 (ref 5.0–8.0)

## 2021-01-01 LAB — CBC
HCT: 41.3 % (ref 36.0–46.0)
Hemoglobin: 14.4 g/dL (ref 12.0–15.0)
MCH: 31.5 pg (ref 26.0–34.0)
MCHC: 34.9 g/dL (ref 30.0–36.0)
MCV: 90.4 fL (ref 80.0–100.0)
Platelets: 388 10*3/uL (ref 150–400)
RBC: 4.57 MIL/uL (ref 3.87–5.11)
RDW: 11.7 % (ref 11.5–15.5)
WBC: 8.7 10*3/uL (ref 4.0–10.5)
nRBC: 0 % (ref 0.0–0.2)

## 2021-01-01 LAB — RESP PANEL BY RT-PCR (FLU A&B, COVID) ARPGX2
Influenza A by PCR: NEGATIVE
Influenza B by PCR: NEGATIVE
SARS Coronavirus 2 by RT PCR: NEGATIVE

## 2021-01-01 LAB — LIPASE, BLOOD: Lipase: 23 U/L (ref 11–51)

## 2021-01-01 MED ORDER — SODIUM CHLORIDE 0.9 % IV BOLUS
1000.0000 mL | Freq: Once | INTRAVENOUS | Status: AC
  Administered 2021-01-01: 1000 mL via INTRAVENOUS

## 2021-01-01 MED ORDER — ONDANSETRON HCL 4 MG/2ML IJ SOLN
4.0000 mg | Freq: Once | INTRAMUSCULAR | Status: AC
  Administered 2021-01-01: 4 mg via INTRAVENOUS
  Filled 2021-01-01: qty 2

## 2021-01-01 MED ORDER — HALOPERIDOL LACTATE 5 MG/ML IJ SOLN
4.0000 mg | Freq: Once | INTRAMUSCULAR | Status: AC
  Administered 2021-01-01: 4 mg via INTRAVENOUS
  Filled 2021-01-01: qty 1

## 2021-01-01 MED ORDER — ONDANSETRON 4 MG PO TBDP: 4.0000 mg | ORAL_TABLET | Freq: Three times a day (TID) | ORAL | 0 refills | Status: DC | PRN

## 2021-01-01 MED ORDER — IOHEXOL 350 MG/ML SOLN
100.0000 mL | Freq: Once | INTRAVENOUS | Status: AC | PRN
  Administered 2021-01-01: 80 mL via INTRAVENOUS

## 2021-01-01 MED ORDER — POTASSIUM CHLORIDE CRYS ER 20 MEQ PO TBCR: 20.0000 meq | EXTENDED_RELEASE_TABLET | Freq: Two times a day (BID) | ORAL | 0 refills | Status: DC

## 2021-01-01 MED ORDER — POTASSIUM CHLORIDE CRYS ER 20 MEQ PO TBCR
40.0000 meq | EXTENDED_RELEASE_TABLET | Freq: Once | ORAL | Status: AC
  Administered 2021-01-01: 40 meq via ORAL
  Filled 2021-01-01: qty 2

## 2021-01-01 NOTE — ED Triage Notes (Signed)
Patient c/o left abdominal pain, N/v/d x 4 days.

## 2021-01-01 NOTE — ED Notes (Signed)
Patient had BM on bed, pt stated she thinks its from the contrast from the CT scan. This RN and NT helped pt change into clean clothes and changed bedsheets.

## 2021-01-01 NOTE — ED Notes (Signed)
Patient has a urine culture in the main lab 

## 2021-01-04 NOTE — ED Provider Notes (Signed)
[facility] DEPT Provider Note   CSN: [id number] Arrival date & time: 01/01/21  2878     History Chief Complaint  Patient presents with   Abdominal Pain   Emesis   Diarrhea    Carrie Guerra is a 35 y.o. female.  HPI    35yo femeale with history of cyclic vomiting, cannabinoid hyperemesis, presents with concern for nausea, vomiting, diarrhea, and abdominal pain for 4 days.  Reports she stopped using marijuana given concerns for cannabinoid hyperemesis. She does feel the symptoms today are improved by hot shower.  Has been feeling sick for 4 days, multiple episodes a day of both n/v and diarrhea.  No black or bloody stools.  No fevers. She has pain located in the LLQ of abdomen which is severe. No urinary symptoms or vaginal discharge. No known sick contacts   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 ([place]) 11/09/2016   Elevated bilirubin 2021   Elevated lipase 2021   Fibroid    Fibroids    Low blood potassium 2021   Migraines    menstrual migraine wiht aura    Patient Active Problem List   Diagnosis Date Noted   Vomiting 11/17/2016   Hypokalemia 11/17/2016   Cannabis hyperemesis syndrome concurrent with and due to cannabis abuse ([place]) 11/09/2016    Past Surgical History:  Procedure Laterality Date   ABDOMINAL HYSTERECTOMY     broken hand Left    pins placed in left hand   NO PAST SURGERIES       OB History     Gravida  0   Para  0   Term  0   Preterm  0   AB  0   Living  0      SAB  0   IAB  0   Ectopic  0   Multiple  0   Live Births  0           Family History  Problem Relation Age of Onset   Diabetes Mother    Breast cancer Mother        Pt.thinks she may have had br.ca   Hypertension Mother    Diabetes Other    Hypertension Maternal Grandmother     Social History   Tobacco Use   Smoking status:  Former    Pack years: 0.00    Types: Cigarettes   Smokeless tobacco: Never   Tobacco comments:    11/17/2016 "someday smoker when I did smoke"  Vaping Use   Vaping Use: Former  Substance Use Topics   Alcohol use: Yes    Comment: occassionly   Drug use: Not Currently    Types: Marijuana    Home Medications Prior to Admission medications   Medication Sig Start Date End Date Taking? Authorizing Provider  acetaminophen (TYLENOL) 325 MG tablet Take 650 mg by mouth every 6 (six) hours as needed for mild pain, fever or headache.   Yes [provider]  ondansetron (ZOFRAN ODT) 4 MG disintegrating tablet Take 1 tablet (4 mg total) by mouth every 8 (eight) hours as needed for nausea or vomiting. 01/01/21  Yes Gareth Morgan, MD  sucralfate (CARAFATE) 1 GM/10ML suspension Take 10 mLs (1 g total) by mouth 4 (four) times daily -  with meals and at bedtime. 12/22/20  Yes Petrucelli, Samantha R, PA-C  potassium chloride SA (KLOR-CON)  20 MEQ tablet Take 1 tablet (20 mEq total) by mouth 2 (two) times daily for 3 days. 01/01/21 01/04/21  Gareth Morgan, MD  pantoprazole (PROTONIX) 20 MG tablet Take 1 tablet (20 mg total) by mouth daily. Patient not taking: Reported on 12/28/2019 06/22/19 12/28/19  Muthersbaugh, [name], PA-C  potassium chloride (KLOR-CON) 10 MEQ tablet Take 1 tablet (10 mEq total) by mouth 2 (two) times daily. Patient not taking: Reported on 12/28/2019 08/31/19 12/28/19  Ezequiel Essex, MD  prochlorperazine (COMPAZINE) 25 MG suppository Place 1 suppository (25 mg total) rectally every 12 (twelve) hours as needed for nausea or vomiting. Patient not taking: Reported on 04/04/2017 11/08/16 06/22/19  Lorin Glass, PA-C    Allergies    Cabbage and Shrimp [shellfish allergy]  Review of Systems   Review of Systems  Constitutional:  Positive for fatigue. Negative for fever.  HENT:  Negative for sore throat.   Eyes:  Negative for visual disturbance.  Respiratory:  Negative for cough  and shortness of breath.   Cardiovascular:  Negative for chest pain.  Gastrointestinal:  Positive for abdominal pain, diarrhea, nausea and vomiting.  Genitourinary:  Negative for difficulty urinating.  Musculoskeletal:  Negative for back pain.  Skin:  Negative for rash.  Neurological:  Negative for syncope and headaches.   Physical Exam Updated Vital Signs BP (!) 158/113   Pulse (!) 109   Temp 97.9 F (36.6 C) (Oral)   Resp (!) 22   Ht 5' (1.524 m)   Wt 61.2 kg   LMP 08/29/2019   SpO2 100%   BMI 26.37 kg/m   Physical Exam Vitals and nursing note reviewed.  Constitutional:      General: She is not in acute distress.    Appearance: She is well-developed. She is not diaphoretic.  HENT:     Head: Normocephalic and atraumatic.  Eyes:     Conjunctiva/sclera: Conjunctivae normal.  Cardiovascular:     Rate and Rhythm: Normal rate and regular rhythm.     Heart sounds: Normal heart sounds. No murmur heard.   No friction rub. No gallop.  Pulmonary:     Effort: Pulmonary effort is normal. No respiratory distress.     Breath sounds: Normal breath sounds. No wheezing or rales.  Abdominal:     General: There is no distension.     Palpations: Abdomen is soft.     Tenderness: There is no abdominal tenderness. There is no guarding.  Musculoskeletal:        General: No tenderness.     Cervical back: Normal range of motion.  Skin:    General: Skin is warm and dry.     Findings: No erythema or rash.  Neurological:     Mental Status: She is alert and oriented to person, place, and time.    ED Results / Procedures / Treatments   Labs (all labs ordered are listed, but only abnormal results are displayed) Labs Reviewed  COMPREHENSIVE METABOLIC PANEL - Abnormal; Notable for the following components:      Result Value   Sodium 132 (*)    Potassium 2.9 (*)    Chloride 97 (*)    CO2 21 (*)    Glucose, Bld 116 (*)    BUN 27 (*)    Creatinine, Ser 1.09 (*)    Total Protein 9.1 (*)     Total Bilirubin 1.5 (*)    All other components within normal limits  URINALYSIS, ROUTINE W REFLEX MICROSCOPIC - Abnormal; Notable for the  following components:   APPearance HAZY (*)    Specific Gravity, Urine 1.032 (*)    Bilirubin Urine SMALL (*)    Ketones, ur 20 (*)    Protein, ur 100 (*)    Bacteria, UA FEW (*)    All other components within normal limits  RESP PANEL BY RT-PCR (FLU A&B, COVID) ARPGX2  LIPASE, BLOOD  CBC    EKG None  Radiology No results found.  Procedures Procedures   Medications Ordered in ED Medications  ondansetron (ZOFRAN) injection 4 mg (4 mg Intravenous Given 01/01/21 1349)  haloperidol lactate (HALDOL) injection 4 mg (4 mg Intravenous Given 01/01/21 1350)  sodium chloride 0.9 % bolus 1,000 mL (0 mLs Intravenous Stopped 01/01/21 1541)  iohexol (OMNIPAQUE) 350 MG/ML injection 100 mL (80 mLs Intravenous Contrast Given 01/01/21 1403)  potassium chloride SA (KLOR-CON) CR tablet 40 mEq (40 mEq Oral Given 01/01/21 1541)    ED Course  I have reviewed the triage vital signs and the nursing notes.  Pertinent labs & imaging results that were available during my care of the patient were reviewed by me and considered in my medical decision making (see chart for details).    MDM Rules/Calculators/A&P                           35yo femeale with history of cyclic vomiting, cannabinoid hyperemesis, presents with concern for nausea, vomiting, diarrhea, and abdominal pain for 4 days. DDx includes appendicitis, pancreatitis, cholecystitis, pyelonephritis, nephrolithiasis, diverticulitis, ovarian torsion, and tuboovarian abscess, gastroenteritis, cannabinoid hyperemesis.  Given focal tenderness on exam, CT abdomen pelvis ordered for evaluation for diverticulitis. CT without acute findings.  Denies cannabinoid use. Consider gastroenteritis or other cyclic vomiting.  Given IV fluids, improved with haldol. Labs show hypokalemia. Given K in ED. Given rx for zofran and K.  Patient discharged in stable condition with understanding of reasons to return.    Final Clinical Impression(s) / ED Diagnoses Final diagnoses:  Non-intractable vomiting with nausea, unspecified vomiting type  Diarrhea of infectious origin    Rx / DC Orders ED Discharge Orders          Ordered    ondansetron (ZOFRAN ODT) 4 MG disintegrating tablet  Every 8 hours PRN        01/01/21 1527    potassium chloride SA (KLOR-CON) 20 MEQ tablet  2 times daily        01/01/21 1527             Gareth Morgan, MD 01/04/21 [id number]

## 2021-03-09 ENCOUNTER — Other Ambulatory Visit: Payer: Self-pay

## 2021-03-09 ENCOUNTER — Emergency Department (HOSPITAL_COMMUNITY): Payer: 59

## 2021-03-09 ENCOUNTER — Encounter (HOSPITAL_COMMUNITY): Payer: Self-pay

## 2021-03-09 ENCOUNTER — Emergency Department (HOSPITAL_COMMUNITY)
Admission: EM | Admit: 2021-03-09 | Discharge: 2021-03-09 | Disposition: A | Discharge status: Home / Self Care | Payer: 59 | Attending: Emergency Medicine | Admitting: Emergency Medicine

## 2021-03-09 DIAGNOSIS — Z87891 Personal history of nicotine dependence: Secondary | ICD-10-CM | POA: Insufficient documentation

## 2021-03-09 DIAGNOSIS — J45909 Unspecified asthma, uncomplicated: Secondary | ICD-10-CM | POA: Diagnosis not present

## 2021-03-09 DIAGNOSIS — W07XXXA Fall from chair, initial encounter: Secondary | ICD-10-CM | POA: Insufficient documentation

## 2021-03-09 DIAGNOSIS — Y9389 Activity, other specified: Secondary | ICD-10-CM | POA: Diagnosis not present

## 2021-03-09 DIAGNOSIS — S52125A Nondisplaced fracture of head of left radius, initial encounter for closed fracture: Secondary | ICD-10-CM | POA: Insufficient documentation

## 2021-03-09 DIAGNOSIS — S4992XA Unspecified injury of left shoulder and upper arm, initial encounter: Secondary | ICD-10-CM | POA: Diagnosis present

## 2021-03-09 IMAGING — CR DG SHOULDER 2+V*L*
2 series · 2 of 2 positions shown · non-contrast
Comparison: None.

CLINICAL DATA: Fall, landing on left arm. Left arm and shoulder
pain.

EXAM:
LEFT HAND - COMPLETE 3+ VIEW; LEFT ELBOW - COMPLETE 3+ VIEW; LEFT
SHOULDER - 2+ VIEW; LEFT HUMERUS - 2+ VIEW; LEFT FOREARM - 2 VIEW;
LEFT WRIST - COMPLETE 3+ VIEW

[w shoulder internal left (1 of 2)]
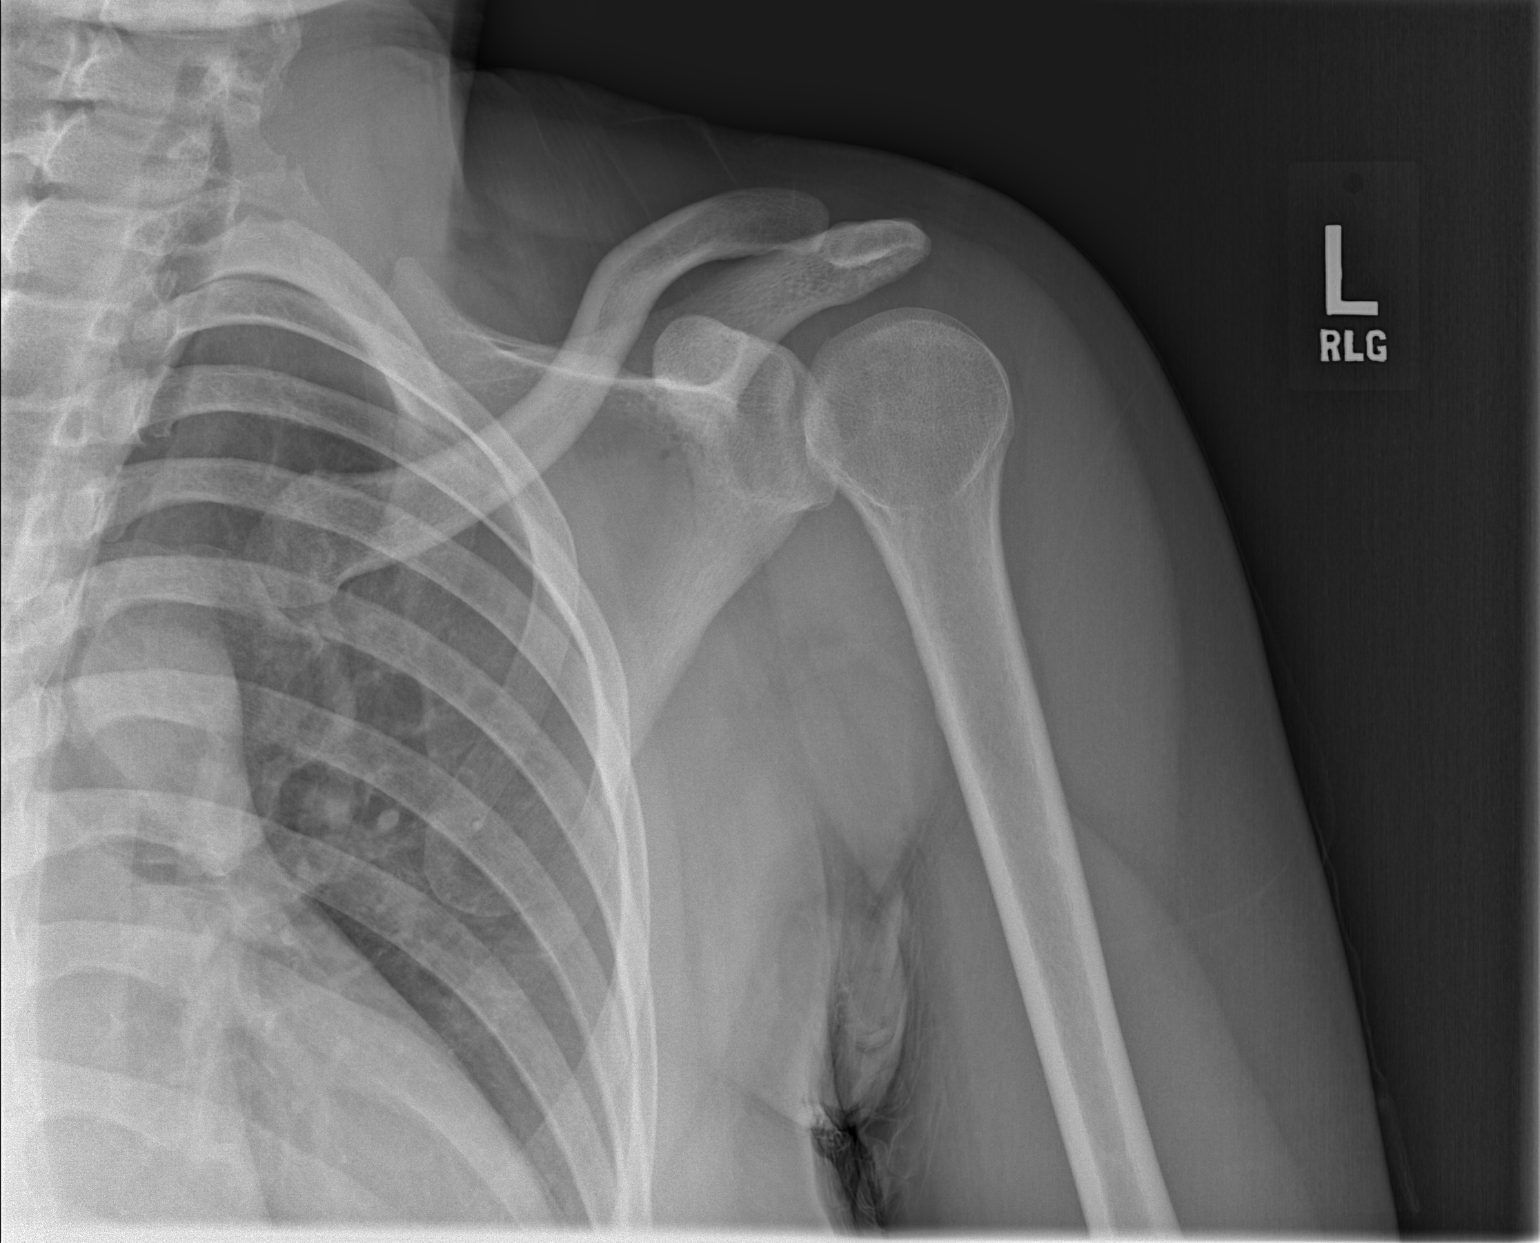

[w shoulder internal left (2 of 2)]
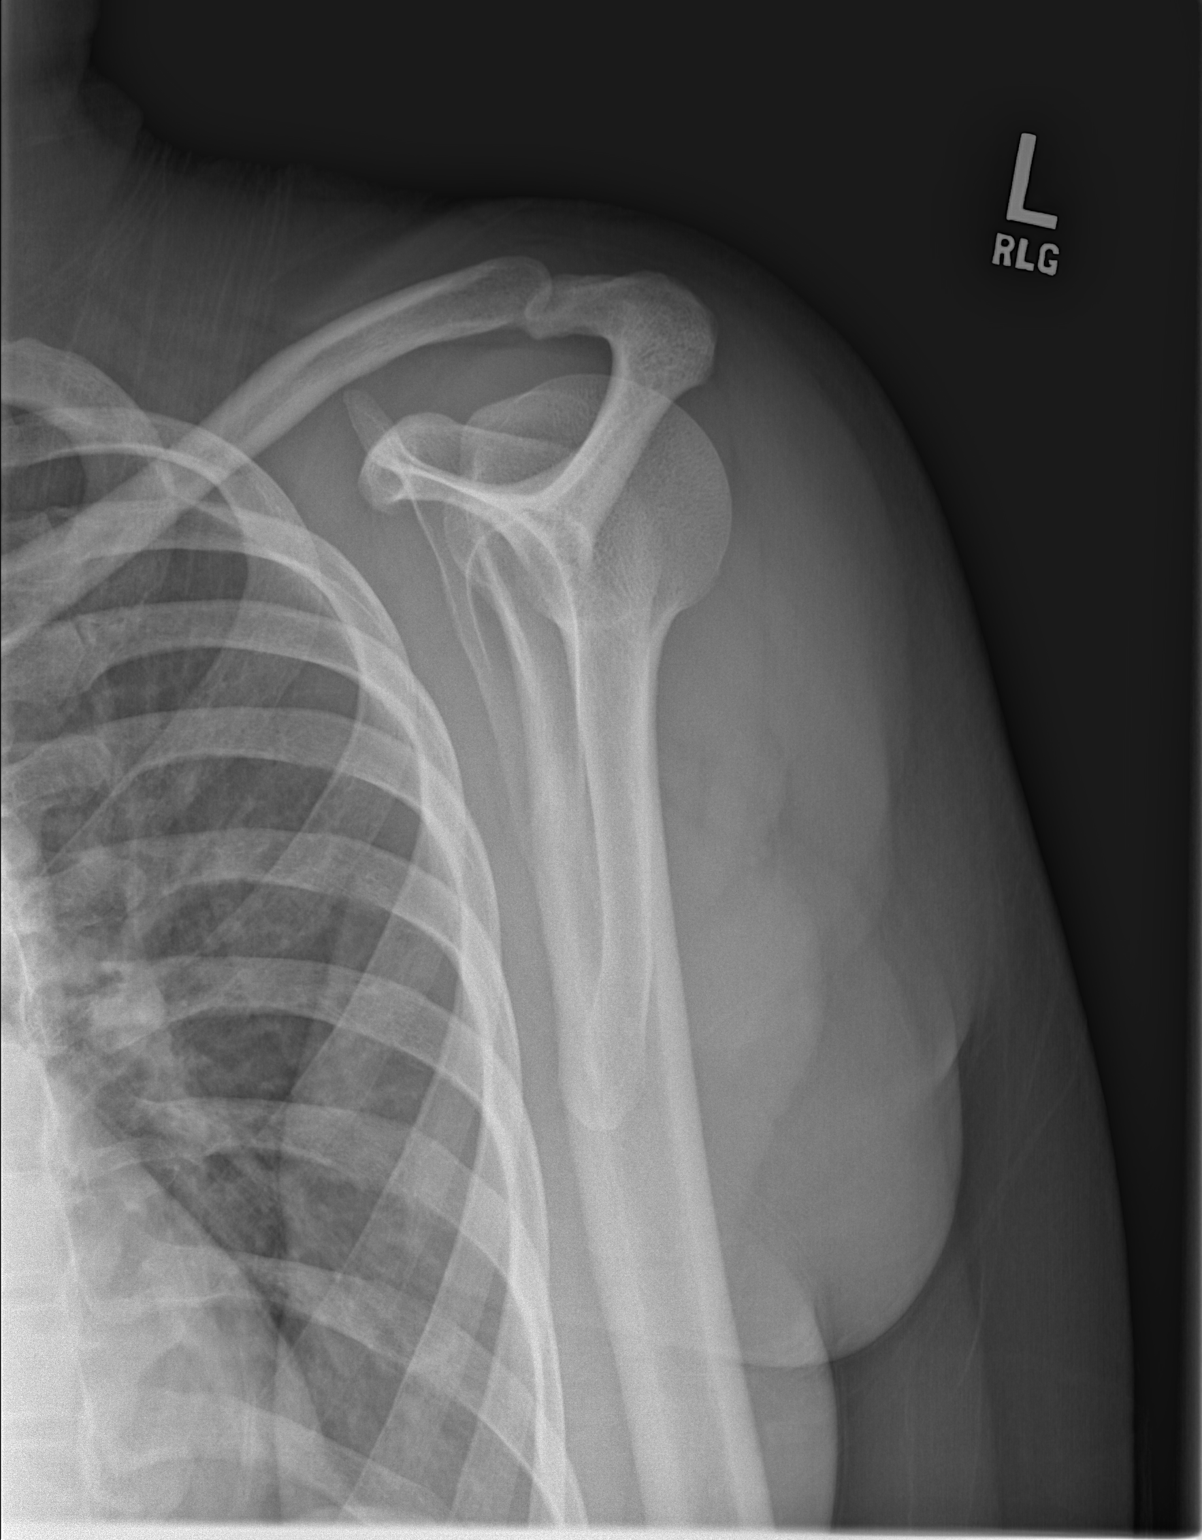

[2 of 2 positions shown; findings below may reference images not displayed]

FINDINGS: Left hand: No fracture, dislocation, or soft tissue abnormality.

Left wrist: Slight dorsal tilt of the distal ulna compared to the
radius. Otherwise no fracture, dislocation, or soft tissue
abnormality.

Left forearm: No fracture, dislocation, or soft tissue abnormality.

Left elbow: Slightly atypical appearance of the radial neck may be
projectional, however fracture is difficult to exclude. Elbow joint
effusion is noted.

Left humerus: No fracture, dislocation, or soft tissue abnormality.

Left shoulder: No fracture, dislocation, or soft tissue abnormality.
IMPRESSION: 1. Findings suspicious for nondisplaced fracture of the radial neck.
Please correlate for focal tenderness.
2. Dorsal tilt of the distal ulna relative to the radius seen on the
lateral views of the wrist may be normal variation or projectional
artifact. Findings could be related to ulnar dislocation. Please
correlate for wrist pain.

## 2021-03-09 MED ORDER — HYDROCODONE-ACETAMINOPHEN 5-325 MG PO TABS
1.0000 | ORAL_TABLET | Freq: Once | ORAL | Status: AC
  Administered 2021-03-09: 1 via ORAL
  Filled 2021-03-09: qty 1

## 2021-03-09 MED ORDER — HYDROCODONE-ACETAMINOPHEN 5-325 MG PO TABS: 2.0000 | ORAL_TABLET | ORAL | 0 refills | Status: DC | PRN

## 2021-03-09 MED ORDER — HYDROMORPHONE HCL 1 MG/ML IJ SOLN
1.0000 mg | Freq: Once | INTRAMUSCULAR | Status: AC
  Administered 2021-03-09: 1 mg via INTRAVENOUS
  Filled 2021-03-09: qty 1

## 2021-03-09 NOTE — ED Provider Notes (Signed)
[facility] DEPT Provider Note   CSN: [id number] Arrival date & time: 03/09/21  [id number]     History Chief Complaint  Patient presents with   Arm Injury    Carrie Guerra is a 35 y.o. female.   Arm Injury Associated symptoms: no back pain and no fever    35 year old female presenting to the emergency department with left shoulder and elbow pain after a fall.  The patient states that she was trying to swat a fly on the wall and was standing on a rolling chair when she fell off the chair onto a hard surface.  She states that she experienced immediate pain and inability to range her left upper extremity.  She endorses sharp and shooting pain about her left shoulder and left elbow.  She states that she is unable to range her left elbow or shoulder at all.  She denies any head trauma or loss of consciousness.  She is not on anticoagulation.  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 ([place]) 11/09/2016   Elevated bilirubin 2021   Elevated lipase 2021   Fibroid    Fibroids    Low blood potassium 2021   Migraines    menstrual migraine wiht aura    Patient Active Problem List   Diagnosis Date Noted   Vomiting 11/17/2016   Hypokalemia 11/17/2016   Cannabis hyperemesis syndrome concurrent with and due to cannabis abuse ([place]) 11/09/2016    Past Surgical History:  Procedure Laterality Date   ABDOMINAL HYSTERECTOMY     broken hand Left    pins placed in left hand   NO PAST SURGERIES       OB History     Gravida  0   Para  0   Term  0   Preterm  0   AB  0   Living  0      SAB  0   IAB  0   Ectopic  0   Multiple  0   Live Births  0           Family History  Problem Relation Age of Onset   Diabetes Mother    Breast cancer Mother        Pt.thinks she may have had br.ca   Hypertension Mother    Diabetes Other    Hypertension  Maternal Grandmother     Social History   Tobacco Use   Smoking status: Former    Types: Cigarettes   Smokeless tobacco: Never   Tobacco comments:    11/17/2016 "someday smoker when I did smoke"  Vaping Use   Vaping Use: Former  Substance Use Topics   Alcohol use: Yes    Comment: occassionly   Drug use: Not Currently    Types: Marijuana    Home Medications Prior to Admission medications   Medication Sig Start Date End Date Taking? Authorizing Provider  HYDROcodone-acetaminophen (NORCO/VICODIN) 5-325 MG tablet Take 2 tablets by mouth every 4 (four) hours as needed. 03/09/21  Yes Regan Lemming, MD  acetaminophen (TYLENOL) 325 MG tablet Take 650 mg by mouth every 6 (six) hours as needed for mild pain, fever or headache.    [provider]  ondansetron (ZOFRAN ODT) 4 MG disintegrating tablet Take 1 tablet (4 mg total) by mouth every 8 (eight) hours as needed for nausea or vomiting. 01/01/21   Gareth Morgan,  MD  potassium chloride SA (KLOR-CON) 20 MEQ tablet Take 1 tablet (20 mEq total) by mouth 2 (two) times daily for 3 days. 01/01/21 01/04/21  Gareth Morgan, MD  sucralfate (CARAFATE) 1 GM/10ML suspension Take 10 mLs (1 g total) by mouth 4 (four) times daily -  with meals and at bedtime. 12/22/20   Petrucelli, Samantha R, PA-C  pantoprazole (PROTONIX) 20 MG tablet Take 1 tablet (20 mg total) by mouth daily. Patient not taking: Reported on 12/28/2019 06/22/19 12/28/19  Muthersbaugh, [name], PA-C  potassium chloride (KLOR-CON) 10 MEQ tablet Take 1 tablet (10 mEq total) by mouth 2 (two) times daily. Patient not taking: Reported on 12/28/2019 08/31/19 12/28/19  Ezequiel Essex, MD  prochlorperazine (COMPAZINE) 25 MG suppository Place 1 suppository (25 mg total) rectally every 12 (twelve) hours as needed for nausea or vomiting. Patient not taking: Reported on 04/04/2017 11/08/16 06/22/19  Lorin Glass, PA-C    Allergies    Cabbage and Shrimp [shellfish allergy]  Review of Systems    Review of Systems  Constitutional:  Negative for chills and fever.  HENT:  Negative for ear pain and sore throat.   Eyes:  Negative for pain and visual disturbance.  Respiratory:  Negative for cough and shortness of breath.   Cardiovascular:  Negative for chest pain and palpitations.  Gastrointestinal:  Negative for abdominal pain and vomiting.  Genitourinary:  Negative for dysuria and hematuria.  Musculoskeletal:  Positive for arthralgias. Negative for back pain.  Skin:  Negative for color change and rash.  Neurological:  Negative for seizures and syncope.  All other systems reviewed and are negative.  Physical Exam Updated Vital Signs BP (!) 139/98   Pulse 79   Temp 98.4 F (36.9 C) (Oral)   Resp 17   LMP 08/29/2019   SpO2 98%   Physical Exam Vitals and nursing note reviewed.  Constitutional:      General: She is not in acute distress.    Appearance: She is well-developed.     Comments: GCS 15, ABC intact, tearful and in moderate pain  HENT:     Head: Normocephalic and atraumatic.  Eyes:     Extraocular Movements: Extraocular movements intact.     Conjunctiva/sclera: Conjunctivae normal.     Pupils: Pupils are equal, round, and reactive to light.  Neck:     Comments: No midline tenderness to palpation of the cervical spine.  Range of motion intact Cardiovascular:     Rate and Rhythm: Normal rate and regular rhythm.     Heart sounds: No murmur heard. Pulmonary:     Effort: Pulmonary effort is normal. No respiratory distress.     Breath sounds: Normal breath sounds.  Chest:     Comments: Clavicles stable nontender to AP compression.  Chest wall stable and nontender to AP and lateral compression. Abdominal:     Palpations: Abdomen is soft.     Tenderness: There is no abdominal tenderness.  Musculoskeletal:     Cervical back: Neck supple.     Comments: No midline tenderness to palpation of the thoracic or lumbar spine.  Tenderness to palpation of the left shoulder  with decreased range of motion limited by pain.  Tenderness of the olecranon with decreased range of motion of the elbow limited by pain. Tenderness of the radial head on the left. 2+ radial pulses, left upper extremity neurologically intact with intact motor function along the radial, ulnar, median nerve distributions. Of note, the patient's left arm is flexed and stuck  in pronation. Any attempts at supination severely limited by pain.  Skin:    General: Skin is warm and dry.  Neurological:     Mental Status: She is alert.     Comments: Cranial nerves II through XII grossly intact.  Moving the right upper extremity, bilateral lower extremities spontaneously.  Unable to range the left upper extremity limited by pain.  Left upper extremity appears neurologically intact with intact motor function along the radial, ulnar, median nerve distributions. Sensation grossly intact all 4 extremities    ED Results / Procedures / Treatments   Labs (all labs ordered are listed, but only abnormal results are displayed) Labs Reviewed - No data to display  EKG None  Radiology DG Elbow Complete Left  Result Date: 03/09/2021 CLINICAL DATA:  Fall, landing on left arm. Left arm and shoulder pain. EXAM: LEFT HAND - COMPLETE 3+ VIEW; LEFT ELBOW - COMPLETE 3+ VIEW; LEFT SHOULDER - 2+ VIEW; LEFT HUMERUS - 2+ VIEW; LEFT FOREARM - 2 VIEW; LEFT WRIST - COMPLETE 3+ VIEW COMPARISON:  None. FINDINGS: Left hand: No fracture, dislocation, or soft tissue abnormality. Left wrist: Slight dorsal tilt of the distal ulna compared to the radius. Otherwise no fracture, dislocation, or soft tissue abnormality. Left forearm: No fracture, dislocation, or soft tissue abnormality. Left elbow: Slightly atypical appearance of the radial neck may be projectional, however fracture is difficult to exclude. Elbow joint effusion is noted. Left humerus: No fracture, dislocation, or soft tissue abnormality. Left shoulder: No fracture, dislocation, or  soft tissue abnormality. IMPRESSION: 1. Findings suspicious for nondisplaced fracture of the radial neck. Please correlate for focal tenderness. 2. Dorsal tilt of the distal ulna relative to the radius seen on the lateral views of the wrist may be normal variation or projectional artifact. Findings could be related to ulnar dislocation. Please correlate for wrist pain. Electronically Signed   By: Miachel Roux M.D.   On: 03/09/2021 08:59   DG Forearm Left  Result Date: 03/09/2021 CLINICAL DATA:  Fall, landing on left arm. Left arm and shoulder pain. EXAM: LEFT HAND - COMPLETE 3+ VIEW; LEFT ELBOW - COMPLETE 3+ VIEW; LEFT SHOULDER - 2+ VIEW; LEFT HUMERUS - 2+ VIEW; LEFT FOREARM - 2 VIEW; LEFT WRIST - COMPLETE 3+ VIEW COMPARISON:  None. FINDINGS: Left hand: No fracture, dislocation, or soft tissue abnormality. Left wrist: Slight dorsal tilt of the distal ulna compared to the radius. Otherwise no fracture, dislocation, or soft tissue abnormality. Left forearm: No fracture, dislocation, or soft tissue abnormality. Left elbow: Slightly atypical appearance of the radial neck may be projectional, however fracture is difficult to exclude. Elbow joint effusion is noted. Left humerus: No fracture, dislocation, or soft tissue abnormality. Left shoulder: No fracture, dislocation, or soft tissue abnormality. IMPRESSION: 1. Findings suspicious for nondisplaced fracture of the radial neck. Please correlate for focal tenderness. 2. Dorsal tilt of the distal ulna relative to the radius seen on the lateral views of the wrist may be normal variation or projectional artifact. Findings could be related to ulnar dislocation. Please correlate for wrist pain. Electronically Signed   By: Miachel Roux M.D.   On: 03/09/2021 08:59   DG Wrist Complete Left  Result Date: 03/09/2021 CLINICAL DATA:  Fall, landing on left arm. Left arm and shoulder pain. EXAM: LEFT HAND - COMPLETE 3+ VIEW; LEFT ELBOW - COMPLETE 3+ VIEW; LEFT SHOULDER - 2+  VIEW; LEFT HUMERUS - 2+ VIEW; LEFT FOREARM - 2 VIEW; LEFT WRIST - COMPLETE 3+ VIEW COMPARISON:  None. FINDINGS: Left hand: No fracture, dislocation, or soft tissue abnormality. Left wrist: Slight dorsal tilt of the distal ulna compared to the radius. Otherwise no fracture, dislocation, or soft tissue abnormality. Left forearm: No fracture, dislocation, or soft tissue abnormality. Left elbow: Slightly atypical appearance of the radial neck may be projectional, however fracture is difficult to exclude. Elbow joint effusion is noted. Left humerus: No fracture, dislocation, or soft tissue abnormality. Left shoulder: No fracture, dislocation, or soft tissue abnormality. IMPRESSION: 1. Findings suspicious for nondisplaced fracture of the radial neck. Please correlate for focal tenderness. 2. Dorsal tilt of the distal ulna relative to the radius seen on the lateral views of the wrist may be normal variation or projectional artifact. Findings could be related to ulnar dislocation. Please correlate for wrist pain. Electronically Signed   By: Miachel Roux M.D.   On: 03/09/2021 08:59   CT FOREARM LEFT WO CONTRAST  Result Date: 03/09/2021 CLINICAL DATA:  Forearm trauma, nondiagnostic xray EXAM: CT OF THE LEFT FOREARM WITHOUT CONTRAST TECHNIQUE: Multidetector CT imaging was performed according to the standard protocol. Multiplanar CT image reconstructions were also generated. COMPARISON:  Same day radiograph FINDINGS: Bones/Joint/Cartilage There is a nondisplaced radial head fracture. There is a small elbow joint effusion. There is normal alignment of the radiocapitellar and ulnotrochlear joints. Normal alignment of the DRUJ with the distal ulna positioned within the sigmoid notch of the radius. Ligaments Suboptimally assessed by CT. Muscles and Tendons No intramuscular fluid collection.  No atrophy. Soft tissues Elbow soft tissue swelling. IMPRESSION: Nondisplaced radial head fracture with small elbow joint effusion.  Electronically Signed   By: Maurine Simmering M.D.   On: 03/09/2021 11:41   DG Shoulder Left  Result Date: 03/09/2021 CLINICAL DATA:  Fall, landing on left arm. Left arm and shoulder pain. EXAM: LEFT HAND - COMPLETE 3+ VIEW; LEFT ELBOW - COMPLETE 3+ VIEW; LEFT SHOULDER - 2+ VIEW; LEFT HUMERUS - 2+ VIEW; LEFT FOREARM - 2 VIEW; LEFT WRIST - COMPLETE 3+ VIEW COMPARISON:  None. FINDINGS: Left hand: No fracture, dislocation, or soft tissue abnormality. Left wrist: Slight dorsal tilt of the distal ulna compared to the radius. Otherwise no fracture, dislocation, or soft tissue abnormality. Left forearm: No fracture, dislocation, or soft tissue abnormality. Left elbow: Slightly atypical appearance of the radial neck may be projectional, however fracture is difficult to exclude. Elbow joint effusion is noted. Left humerus: No fracture, dislocation, or soft tissue abnormality. Left shoulder: No fracture, dislocation, or soft tissue abnormality. IMPRESSION: 1. Findings suspicious for nondisplaced fracture of the radial neck. Please correlate for focal tenderness. 2. Dorsal tilt of the distal ulna relative to the radius seen on the lateral views of the wrist may be normal variation or projectional artifact. Findings could be related to ulnar dislocation. Please correlate for wrist pain. Electronically Signed   By: Miachel Roux M.D.   On: 03/09/2021 08:59   DG Humerus Left  Result Date: 03/09/2021 CLINICAL DATA:  Fall, landing on left arm. Left arm and shoulder pain. EXAM: LEFT HAND - COMPLETE 3+ VIEW; LEFT ELBOW - COMPLETE 3+ VIEW; LEFT SHOULDER - 2+ VIEW; LEFT HUMERUS - 2+ VIEW; LEFT FOREARM - 2 VIEW; LEFT WRIST - COMPLETE 3+ VIEW COMPARISON:  None. FINDINGS: Left hand: No fracture, dislocation, or soft tissue abnormality. Left wrist: Slight dorsal tilt of the distal ulna compared to the radius. Otherwise no fracture, dislocation, or soft tissue abnormality. Left forearm: No fracture, dislocation, or soft tissue abnormality.  Left elbow: Slightly atypical  appearance of the radial neck may be projectional, however fracture is difficult to exclude. Elbow joint effusion is noted. Left humerus: No fracture, dislocation, or soft tissue abnormality. Left shoulder: No fracture, dislocation, or soft tissue abnormality. IMPRESSION: 1. Findings suspicious for nondisplaced fracture of the radial neck. Please correlate for focal tenderness. 2. Dorsal tilt of the distal ulna relative to the radius seen on the lateral views of the wrist may be normal variation or projectional artifact. Findings could be related to ulnar dislocation. Please correlate for wrist pain. Electronically Signed   By: Miachel Roux M.D.   On: 03/09/2021 08:59   DG Hand Complete Left  Result Date: 03/09/2021 CLINICAL DATA:  Fall, landing on left arm. Left arm and shoulder pain. EXAM: LEFT HAND - COMPLETE 3+ VIEW; LEFT ELBOW - COMPLETE 3+ VIEW; LEFT SHOULDER - 2+ VIEW; LEFT HUMERUS - 2+ VIEW; LEFT FOREARM - 2 VIEW; LEFT WRIST - COMPLETE 3+ VIEW COMPARISON:  None. FINDINGS: Left hand: No fracture, dislocation, or soft tissue abnormality. Left wrist: Slight dorsal tilt of the distal ulna compared to the radius. Otherwise no fracture, dislocation, or soft tissue abnormality. Left forearm: No fracture, dislocation, or soft tissue abnormality. Left elbow: Slightly atypical appearance of the radial neck may be projectional, however fracture is difficult to exclude. Elbow joint effusion is noted. Left humerus: No fracture, dislocation, or soft tissue abnormality. Left shoulder: No fracture, dislocation, or soft tissue abnormality. IMPRESSION: 1. Findings suspicious for nondisplaced fracture of the radial neck. Please correlate for focal tenderness. 2. Dorsal tilt of the distal ulna relative to the radius seen on the lateral views of the wrist may be normal variation or projectional artifact. Findings could be related to ulnar dislocation. Please correlate for wrist pain.  Electronically Signed   By: Miachel Roux M.D.   On: 03/09/2021 08:59    Procedures Procedures   Medications Ordered in ED Medications  HYDROcodone-acetaminophen (NORCO/VICODIN) 5-325 MG per tablet 1 tablet (1 tablet Oral Given 03/09/21 0811)  HYDROcodone-acetaminophen (NORCO/VICODIN) 5-325 MG per tablet 1 tablet (1 tablet Oral Given 03/09/21 1146)  HYDROmorphone (DILAUDID) injection 1 mg (1 mg Intravenous Given 03/09/21 1230)    ED Course  I have reviewed the triage vital signs and the nursing notes.  Pertinent labs & imaging results that were available during my care of the patient were reviewed by me and considered in my medical decision making (see chart for details).    MDM Rules/Calculators/A&P                           Patient presents to the emergency department with severe left forearm, elbow and wrist pain after [place].  She fell from height while standing on a rolling chair, landing onto a hardwood floor with her arm outstretched.  Differential diagnoses include shoulder dislocation, shoulder or humerus fracture, olecranon fracture, distal radius or ulnar fracture, radial head fracture.  X-ray imaging obtained of the left arm concerning for distal radial head or neck fracture with associated possible distal radial ulnar joint disruption.  This would potentially be concerning for an [place] fracture.  CT of the left forearm was performed and did not reveal clear disruption of the distal radial ulnar joint.  The patient was vascular intact with 2+ radial pulses, intact motor function along the median, ulnar and radial nerve distributions.  I spoke with the on-call orthopedist, Dr. Mardelle Matte.  Plan to splint the forearm in a long-arm splint and have  the patient follow-up in clinic.  Norco prescribed for pain control.  Final Clinical Impression(s) / ED Diagnoses Final diagnoses:  Closed nondisplaced fracture of head of left radius, initial encounter    Rx / DC Orders ED  Discharge Orders          Ordered    HYDROcodone-acetaminophen (NORCO/VICODIN) 5-325 MG tablet  Every 4 hours PRN        03/09/21 1214             Regan Lemming, MD 03/09/21 1253

## 2021-03-09 NOTE — ED Notes (Signed)
Patient verbalized understanding of discharge instructions. Friend here to take patient home.

## 2021-03-09 NOTE — Progress Notes (Signed)
Reviewed films and spoke with EDP.  Minimally displaced radial neck fracture.  Plan for splint and RTC Friday with me.    Johnny Bridge, MD

## 2021-03-09 NOTE — ED Triage Notes (Signed)
Pt arrived via POV, c/o mechanical fall last night, landed on left arm, now with left arm/shoulder pain. Unable to move due to pain. Cap refill <2 seconds.

## 2021-03-09 NOTE — ED Notes (Signed)
Ortho tech at bedside applying splint long arm to left arm.

## 2021-03-09 NOTE — Discharge Instructions (Addendum)
Please follow-up in clinic with orthopedics in clinic later this week. A referral has been placed.

## 2021-10-23 ENCOUNTER — Encounter (HOSPITAL_COMMUNITY): Payer: Self-pay | Admitting: *Deleted

## 2021-10-23 ENCOUNTER — Emergency Department (HOSPITAL_COMMUNITY): Payer: Self-pay

## 2021-10-23 ENCOUNTER — Other Ambulatory Visit: Payer: Self-pay

## 2021-10-23 ENCOUNTER — Emergency Department (HOSPITAL_COMMUNITY)
Admission: EM | Admit: 2021-10-23 | Discharge: 2021-10-23 | Disposition: A | Discharge status: Home / Self Care | Payer: Self-pay | Attending: Emergency Medicine | Admitting: Emergency Medicine

## 2021-10-23 DIAGNOSIS — Z20822 Contact with and (suspected) exposure to covid-19: Secondary | ICD-10-CM | POA: Insufficient documentation

## 2021-10-23 DIAGNOSIS — J189 Pneumonia, unspecified organism: Secondary | ICD-10-CM | POA: Insufficient documentation

## 2021-10-23 DIAGNOSIS — R748 Abnormal levels of other serum enzymes: Secondary | ICD-10-CM | POA: Insufficient documentation

## 2021-10-23 DIAGNOSIS — E876 Hypokalemia: Secondary | ICD-10-CM | POA: Insufficient documentation

## 2021-10-23 DIAGNOSIS — R112 Nausea with vomiting, unspecified: Secondary | ICD-10-CM

## 2021-10-23 DIAGNOSIS — R197 Diarrhea, unspecified: Secondary | ICD-10-CM | POA: Insufficient documentation

## 2021-10-23 DIAGNOSIS — R000 Tachycardia, unspecified: Secondary | ICD-10-CM | POA: Insufficient documentation

## 2021-10-23 LAB — CBC WITH DIFFERENTIAL/PLATELET
Abs Immature Granulocytes: 0.03 10*3/uL (ref 0.00–0.07)
Basophils Absolute: 0 10*3/uL (ref 0.0–0.1)
Basophils Relative: 1 %
Eosinophils Absolute: 0.1 10*3/uL (ref 0.0–0.5)
Eosinophils Relative: 2 %
HCT: 35.8 % — ABNORMAL LOW (ref 36.0–46.0)
Hemoglobin: 12.2 g/dL (ref 12.0–15.0)
Immature Granulocytes: 1 %
Lymphocytes Relative: 29 %
Lymphs Abs: 1.8 10*3/uL (ref 0.7–4.0)
MCH: 31.2 pg (ref 26.0–34.0)
MCHC: 34.1 g/dL (ref 30.0–36.0)
MCV: 91.6 fL (ref 80.0–100.0)
Monocytes Absolute: 0.6 10*3/uL (ref 0.1–1.0)
Monocytes Relative: 10 %
Neutro Abs: 3.8 10*3/uL (ref 1.7–7.7)
Neutrophils Relative %: 57 %
Platelets: 344 10*3/uL (ref 150–400)
RBC: 3.91 MIL/uL (ref 3.87–5.11)
RDW: 11.9 % (ref 11.5–15.5)
WBC: 6.4 10*3/uL (ref 4.0–10.5)
nRBC: 0 % (ref 0.0–0.2)

## 2021-10-23 LAB — COMPREHENSIVE METABOLIC PANEL
ALT: 24 U/L (ref 0–44)
AST: 35 U/L (ref 15–41)
Albumin: 4.2 g/dL (ref 3.5–5.0)
Alkaline Phosphatase: 44 U/L (ref 38–126)
Anion gap: 10 (ref 5–15)
BUN: 9 mg/dL (ref 6–20)
CO2: 20 mmol/L — ABNORMAL LOW (ref 22–32)
Calcium: 9.1 mg/dL (ref 8.9–10.3)
Chloride: 107 mmol/L (ref 98–111)
Creatinine, Ser: 0.95 mg/dL (ref 0.44–1.00)
GFR, Estimated: >60 mL/min (ref >60)
Glucose, Bld: 118 mg/dL — ABNORMAL HIGH (ref 70–99)
Potassium: 3 mmol/L — ABNORMAL LOW (ref 3.5–5.1)
Sodium: 137 mmol/L (ref 135–145)
Total Bilirubin: 1.2 mg/dL (ref 0.3–1.2)
Total Protein: 8.2 g/dL — ABNORMAL HIGH (ref 6.5–8.1)

## 2021-10-23 LAB — RESP PANEL BY RT-PCR (FLU A&B, COVID) ARPGX2
Influenza A by PCR: NEGATIVE
Influenza B by PCR: NEGATIVE
SARS Coronavirus 2 by RT PCR: NEGATIVE

## 2021-10-23 LAB — MAGNESIUM: Magnesium: 1.6 mg/dL — ABNORMAL LOW (ref 1.7–2.4)

## 2021-10-23 LAB — I-STAT BETA HCG BLOOD, ED (MC, WL, AP ONLY): I-stat hCG, quantitative: <5 m[IU]/mL (ref <5)

## 2021-10-23 LAB — LIPASE, BLOOD: Lipase: 106 U/L — ABNORMAL HIGH (ref 11–51)

## 2021-10-23 MED ORDER — DROPERIDOL 2.5 MG/ML IJ SOLN
2.5000 mg | Freq: Once | INTRAMUSCULAR | Status: AC
  Administered 2021-10-23: 2.5 mg via INTRAVENOUS
  Filled 2021-10-23: qty 2

## 2021-10-23 MED ORDER — AZITHROMYCIN 250 MG PO TABS: 250.0000 mg | ORAL_TABLET | Freq: Every day | ORAL | 0 refills | Status: DC

## 2021-10-23 MED ORDER — LACTATED RINGERS IV SOLN: INTRAVENOUS | Status: DC

## 2021-10-23 MED ORDER — MAGNESIUM OXIDE -MG SUPPLEMENT 400 (240 MG) MG PO TABS
400.0000 mg | ORAL_TABLET | Freq: Once | ORAL | Status: AC
  Administered 2021-10-23: 400 mg via ORAL
  Filled 2021-10-23: qty 1

## 2021-10-23 MED ORDER — POTASSIUM CHLORIDE CRYS ER 20 MEQ PO TBCR
40.0000 meq | EXTENDED_RELEASE_TABLET | Freq: Once | ORAL | Status: AC
  Administered 2021-10-23: 40 meq via ORAL
  Filled 2021-10-23: qty 2

## 2021-10-23 MED ORDER — ONDANSETRON 4 MG PO TBDP: 4.0000 mg | ORAL_TABLET | Freq: Three times a day (TID) | ORAL | 0 refills | Status: DC | PRN

## 2021-10-23 MED ORDER — MAGNESIUM SULFATE 2 GM/50ML IV SOLN
2.0000 g | Freq: Once | INTRAVENOUS | Status: AC
  Administered 2021-10-23: 2 g via INTRAVENOUS
  Filled 2021-10-23: qty 50

## 2021-10-23 MED ORDER — LACTATED RINGERS IV BOLUS
1000.0000 mL | Freq: Once | INTRAVENOUS | Status: AC
  Administered 2021-10-23: 1000 mL via INTRAVENOUS

## 2021-10-23 MED ORDER — IOHEXOL 300 MG/ML  SOLN
100.0000 mL | Freq: Once | INTRAMUSCULAR | Status: AC | PRN
  Administered 2021-10-23: 100 mL via INTRAVENOUS

## 2021-10-23 MED ORDER — ONDANSETRON HCL 4 MG/2ML IJ SOLN
4.0000 mg | Freq: Once | INTRAMUSCULAR | Status: AC
  Administered 2021-10-23: 4 mg via INTRAVENOUS
  Filled 2021-10-23: qty 2

## 2021-10-23 MED ORDER — ZINC OXIDE 40 % EX OINT
TOPICAL_OINTMENT | Freq: Once | CUTANEOUS | Status: AC
  Filled 2021-10-23: qty 57

## 2021-10-23 NOTE — ED Provider Notes (Signed)
?[facility] DEPT ?Provider Note ? ? ?CSN: [id number] ?Arrival date & time: 10/23/21  9371 ? ?  ? ?History ? ?Chief Complaint  ?Patient presents with  ? Cough  ? Diarrhea  ? Fever  ? Chills  ? ? ?Carrie Guerra is a 36 y.o. female. ? ? ?Cough ?Associated symptoms: fever   ?Diarrhea ?Associated symptoms: fever and vomiting   ?Associated symptoms: no abdominal pain   ?Fever ?Associated symptoms: cough, diarrhea, nausea and vomiting   ? ? 36 year old female presenting to the ED with a cough, chills, and diarrhea. She works [employer] and went out in the rain on [employer]. Has had a cough ever since with productive yellow mucous. Slightly dry cough. Endorses pleuritic chest discomfort. Has had watery diarrhea multiple times all day yesterday. Endorses a red rash along her buttocks area from the diarrhea. Subjective fevers at home with chills. Denies abdominal pain, pelvic pain, dysuria or increased frequency. No flank pain. Has had nausea and vomiting. Had trouble keeping anything down yesterday.  ? ?Home Medications ?Prior to Admission medications   ?Medication Sig Start Date End Date Taking? Authorizing Provider  ?azithromycin (ZITHROMAX) 250 MG tablet Take 1 tablet (250 mg total) by mouth daily. Take first 2 tablets together, then 1 every day until finished. 10/23/21  Yes Regan Lemming, MD  ?ondansetron (ZOFRAN-ODT) 4 MG disintegrating tablet Take 1 tablet (4 mg total) by mouth every 8 (eight) hours as needed for nausea or vomiting. 10/23/21  Yes Regan Lemming, MD  ?acetaminophen (TYLENOL) 325 MG tablet Take 650 mg by mouth every 6 (six) hours as needed for mild pain, fever or headache.    [provider]  ?sucralfate (CARAFATE) 1 GM/10ML suspension Take 10 mLs (1 g total) by mouth 4 (four) times daily -  with meals and at bedtime. 12/22/20   Petrucelli, Samantha R, PA-C  ?pantoprazole (PROTONIX) 20 MG tablet Take 1 tablet (20 mg total) by mouth daily. ?Patient not taking: Reported on  12/28/2019 06/22/19 12/28/19  Muthersbaugh, [name], PA-C  ?potassium chloride (KLOR-CON) 10 MEQ tablet Take 1 tablet (10 mEq total) by mouth 2 (two) times daily. ?Patient not taking: Reported on 12/28/2019 08/31/19 12/28/19  Ezequiel Essex, MD  ?prochlorperazine (COMPAZINE) 25 MG suppository Place 1 suppository (25 mg total) rectally every 12 (twelve) hours as needed for nausea or vomiting. ?Patient not taking: Reported on 04/04/2017 11/08/16 06/22/19  Lorin Glass, PA-C  ?   ? ?Allergies    ?Cabbage and Shrimp [shellfish allergy]   ? ?Review of Systems   ?Review of Systems  ?Constitutional:  Positive for fever.  ?Respiratory:  Positive for cough.   ?Gastrointestinal:  Positive for diarrhea, nausea and vomiting. Negative for abdominal pain.  ?All other systems reviewed and are negative. ? ?Physical Exam ?Updated Vital Signs ?BP (!) 142/97 (BP Location: Left Arm)   Pulse 88   Temp 98.7 ?F (37.1 ?C) (Oral)   Resp 20   Ht 5' (1.524 m)   Wt 68 kg   LMP 08/29/2019   SpO2 99%   BMI 29.29 kg/m?  ?Physical Exam ?Vitals and nursing note reviewed.  ?Constitutional:   ?   General: She is not in acute distress. ?HENT:  ?   Head: Normocephalic and atraumatic.  ?Eyes:  ?   Conjunctiva/sclera: Conjunctivae normal.  ?   Pupils: Pupils are equal, round, and reactive to light.  ?Cardiovascular:  ?   Rate and Rhythm: Normal rate and regular rhythm.  ?Pulmonary:  ?  Effort: Pulmonary effort is normal. No respiratory distress.  ?Abdominal:  ?   General: There is no distension.  ?   Tenderness: There is no guarding.  ?Musculoskeletal:     ?   General: No deformity or signs of injury.  ?   Cervical back: Neck supple.  ?Skin: ?   Findings: No lesion or rash.  ?Neurological:  ?   General: No focal deficit present.  ?   Mental Status: She is alert. Mental status is at baseline.  ? ? ?ED Results / Procedures / Treatments   ?Labs ?(all labs ordered are listed, but only abnormal results are displayed) ?Labs Reviewed  ?CBC WITH  DIFFERENTIAL/PLATELET - Abnormal; Notable for the following components:  ?    Result Value  ? HCT 35.8 (*)   ? All other components within normal limits  ?COMPREHENSIVE METABOLIC PANEL - Abnormal; Notable for the following components:  ? Potassium 3.0 (*)   ? CO2 20 (*)   ? Glucose, Bld 118 (*)   ? Total Protein 8.2 (*)   ? All other components within normal limits  ?LIPASE, BLOOD - Abnormal; Notable for the following components:  ? Lipase 106 (*)   ? All other components within normal limits  ?MAGNESIUM - Abnormal; Notable for the following components:  ? Magnesium 1.6 (*)   ? All other components within normal limits  ?RESP PANEL BY RT-PCR (FLU A&B, COVID) [id number]  ?I-STAT BETA HCG BLOOD, ED ([facility], [facility], [facility] ONLY)  ? ? ?EKG ?EKG Interpretation ? ?Date/Time:  Saturday October 23 2021 08:00:24 EDT ?Ventricular Rate:  78 ?PR Interval:  177 ?QRS Duration: 80 ?QT Interval:  395 ?QTC Calculation: 450 ?R Axis:   70 ?Text Interpretation: Sinus rhythm No significant change since last tracing Confirmed by Regan Lemming ([id number]) on 10/23/2021 8:04:26 AM ? ?Radiology ?DG Chest 2 View ? ?Result Date: 10/23/2021 ?CLINICAL DATA:  Cough EXAM: CHEST - 2 VIEW COMPARISON:  May 2018 FINDINGS: Patchy opacity in the left upper lobe. No pleural effusion or pneumothorax. Normal heart size. No acute osseous abnormality. IMPRESSION: Small patchy opacity in the left upper lobe may reflect atelectasis or consolidation. Electronically Signed   By: Macy Mis M.D.   On: 10/23/2021 10:04  ? ?CT ABDOMEN PELVIS W CONTRAST ? ?Result Date: 10/23/2021 ?CLINICAL DATA:  Nausea and vomiting.  Pancreatitis. EXAM: CT ABDOMEN AND PELVIS WITH CONTRAST TECHNIQUE: Multidetector CT imaging of the abdomen and pelvis was performed using the standard protocol following bolus administration of intravenous contrast. RADIATION DOSE REDUCTION: This exam was performed according to the departmental dose-optimization program which includes automated exposure control, adjustment  of the mA and/or kV according to patient size and/or use of iterative reconstruction technique. CONTRAST:  12m OMNIPAQUE IOHEXOL 300 MG/ML  SOLN COMPARISON:  01/01/2021 FINDINGS: Lower chest: Unremarkable. Hepatobiliary: No suspicious focal abnormality within the liver parenchyma. There is no evidence for gallstones, gallbladder wall thickening, or pericholecystic fluid. No intrahepatic or extrahepatic biliary dilation. Pancreas: No focal mass lesion. No dilatation of the main duct. No intraparenchymal cyst. No peripancreatic edema. Spleen: No splenomegaly. No focal mass lesion. Adrenals/Urinary Tract: No adrenal nodule or mass. Kidneys unremarkable. No evidence for hydroureter. The urinary bladder appears normal for the degree of distention. Stomach/Bowel: Stomach is unremarkable. No gastric wall thickening. No evidence of outlet obstruction. Duodenum is normally positioned as is the ligament of Treitz. No small bowel wall thickening. No small bowel dilatation. The terminal ileum is normal. The appendix is normal. No gross colonic  mass. No colonic wall thickening. Vascular/Lymphatic: No abdominal aortic aneurysm. No abdominal lymphadenopathy No pelvic sidewall lymphadenopathy. Reproductive: There is no adnexal mass. Other: No intraperitoneal free fluid. Musculoskeletal: No worrisome lytic or sclerotic osseous abnormality. IMPRESSION: No acute findings in the abdomen or pelvis. Specifically, no CT evidence for acute pancreatitis. Electronically Signed   By: Misty Stanley M.D.   On: 10/23/2021 10:58   ? ?Procedures ?Procedures  ? ? ?Medications Ordered in ED ?Medications  ?magnesium sulfate IVPB 2 g 50 mL (2 g Intravenous New Bag/Given 10/23/21 1022)  ?lactated ringers infusion ( Intravenous New Bag/Given 10/23/21 1025)  ?lactated ringers bolus 1,000 mL (0 mLs Intravenous Stopped 10/23/21 1048)  ?ondansetron [facility]) injection 4 mg (4 mg Intravenous Given 10/23/21 0913)  ?liver oil-zinc oxide (DESITIN) 40 % ointment (  Topical Given 10/23/21 1046)  ?magnesium oxide (MAG-OX) tablet 400 mg (400 mg Oral Given 10/23/21 1020)  ?potassium chloride SA (KLOR-CON M) CR tablet 40 mEq (40 mEq Oral Given 10/23/21 1020)  ?droperidol (INAPSINE) 2.5 MG/

## 2021-10-23 NOTE — Discharge Instructions (Addendum)
You were evaluated in the Emergency Department and after careful evaluation, we did not find any emergent condition requiring admission or further testing in the hospital. ? ?Your exam/testing today was concerning for pneumonia which we will treat with antibiotics. Zofran ODT for nausea. Continue to push oral fluids over the next few days to remain hydrated. Return for worsening of symptoms.  ? ?Please return to the Emergency Department if you experience any worsening of your condition.  Thank you for allowing [place] to be a part of your care. ? ?

## 2021-10-23 NOTE — ED Triage Notes (Signed)
2 days of cough, chills, diarrhea, history of asthma, reports fever none in triage. ?

## 2022-10-08 ENCOUNTER — Encounter (HOSPITAL_BASED_OUTPATIENT_CLINIC_OR_DEPARTMENT_OTHER): Payer: Self-pay | Admitting: Emergency Medicine

## 2022-10-08 ENCOUNTER — Other Ambulatory Visit: Payer: Self-pay

## 2022-10-08 ENCOUNTER — Emergency Department (HOSPITAL_BASED_OUTPATIENT_CLINIC_OR_DEPARTMENT_OTHER)
Admission: EM | Admit: 2022-10-08 | Discharge: 2022-10-08 | Disposition: A | Discharge status: Home / Self Care | Payer: Self-pay | Attending: Emergency Medicine | Admitting: Emergency Medicine

## 2022-10-08 ENCOUNTER — Emergency Department (HOSPITAL_BASED_OUTPATIENT_CLINIC_OR_DEPARTMENT_OTHER): Payer: Self-pay

## 2022-10-08 DIAGNOSIS — S0990XA Unspecified injury of head, initial encounter: Secondary | ICD-10-CM | POA: Insufficient documentation

## 2022-10-08 DIAGNOSIS — Y990 Civilian activity done for income or pay: Secondary | ICD-10-CM | POA: Insufficient documentation

## 2022-10-08 DIAGNOSIS — W228XXA Striking against or struck by other objects, initial encounter: Secondary | ICD-10-CM | POA: Insufficient documentation

## 2022-10-08 DIAGNOSIS — J45909 Unspecified asthma, uncomplicated: Secondary | ICD-10-CM | POA: Insufficient documentation

## 2022-10-08 DIAGNOSIS — R519 Headache, unspecified: Secondary | ICD-10-CM

## 2022-10-08 MED ORDER — ACETAMINOPHEN 500 MG PO TABS
1000.0000 mg | ORAL_TABLET | Freq: Once | ORAL | Status: AC
  Administered 2022-10-08: 1000 mg via ORAL
  Filled 2022-10-08: qty 2

## 2022-10-08 MED ORDER — KETOROLAC TROMETHAMINE 60 MG/2ML IM SOLN
60.0000 mg | Freq: Once | INTRAMUSCULAR | Status: AC
  Administered 2022-10-08: 60 mg via INTRAMUSCULAR
  Filled 2022-10-08: qty 2

## 2022-10-08 NOTE — ED Provider Notes (Signed)
[facility] EMERGENCY DEPARTMENT AT [facility] Provider Note   CSN: [id number] Arrival date & time: 10/08/22  0913     History  Chief Complaint  Patient presents with   Head Injury    Carrie Guerra is a 37 y.o. female.  With PMH of asthma and migraines not on anticoagulation who presents with headache after walking into her freezer door yesterday.  Patient was at work yesterday when she got hit in the head with a large freezer door.  She was hit in her left forehead.  She felt lightheaded but did not pass out.  She is felt nauseous but no vomiting.  History of migraines and complaining of left-sided throbbing headache.  She has been taking Tylenol without relief.  She was seen at urgent care yesterday and told only to take Tylenol no ibuprofen.  She is not on any anticoagulation or antiplatelets.  She had no syncope.  She has had no focal weakness numbness or tingling, no visual changes or other complaints. No fevers or illness.   Head Injury      Home Medications Prior to Admission medications   Medication Sig Start Date End Date Taking? Authorizing Provider  acetaminophen (TYLENOL) 325 MG tablet Take 650 mg by mouth every 6 (six) hours as needed for mild pain, fever or headache.    [provider]  azithromycin (ZITHROMAX) 250 MG tablet Take 1 tablet (250 mg total) by mouth daily. Take first 2 tablets together, then 1 every day until finished. 10/23/21   Ernie Avena, MD  ondansetron (ZOFRAN-ODT) 4 MG disintegrating tablet Take 1 tablet (4 mg total) by mouth every 8 (eight) hours as needed for nausea or vomiting. 10/23/21   Ernie Avena, MD  sucralfate (CARAFATE) 1 GM/10ML suspension Take 10 mLs (1 g total) by mouth 4 (four) times daily -  with meals and at bedtime. 12/22/20   Petrucelli, Samantha R, PA-C  pantoprazole (PROTONIX) 20 MG tablet Take 1 tablet (20 mg total) by mouth daily. Patient not taking: Reported on 12/28/2019 06/22/19 12/28/19  Muthersbaugh, Dahlia Client,  PA-C  potassium chloride (KLOR-CON) 10 MEQ tablet Take 1 tablet (10 mEq total) by mouth 2 (two) times daily. Patient not taking: Reported on 12/28/2019 08/31/19 12/28/19  Glynn Octave, MD  prochlorperazine (COMPAZINE) 25 MG suppository Place 1 suppository (25 mg total) rectally every 12 (twelve) hours as needed for nausea or vomiting. Patient not taking: Reported on 04/04/2017 11/08/16 06/22/19  Cristina Gong, PA-C      Allergies    Cabbage and Shrimp [shellfish allergy]    Review of Systems   Review of Systems  Physical Exam Updated Vital Signs BP (!) 154/99   Pulse 70   Temp 98.2 F (36.8 C) (Oral)   Resp 20   LMP 08/29/2019   SpO2 100%  Physical Exam Constitutional: Alert and oriented.  Uncomfortable laying on side in bed with eyes closed but nontoxic Eyes: Conjunctivae are normal. ENT      Head: Normocephalic and atraumatic.      Neck: No ttp, no deformity Cardiovascular: Regular rate Respiratory: Normal respiratory effort.  Gastrointestinal: Nondistended Neurologic: Normal speech and language.  CN II through XII grossly intact.  5 out of 5 strength bilateral upper and lower extremities.  Sensation grossly intact.  Steady ambulatory gait.  No gross focal neurologic deficits are appreciated. Skin: Skin is warm, dry and intact. No rash noted. Psychiatric: Mood and affect are normal. Speech and behavior are normal.  ED Results / Procedures /  Treatments   Labs (all labs ordered are listed, but only abnormal results are displayed) Labs Reviewed - No data to display  EKG None  Radiology CT Head Wo Contrast  Result Date: 10/08/2022 CLINICAL DATA:  Head trauma.  Headache EXAM: CT HEAD WITHOUT CONTRAST TECHNIQUE: Contiguous axial images were obtained from the base of the skull through the vertex without intravenous contrast. RADIATION DOSE REDUCTION: This exam was performed according to the departmental dose-optimization program which includes automated exposure control,  adjustment of the mA and/or kV according to patient size and/or use of iterative reconstruction technique. COMPARISON:  None Available. FINDINGS: Brain: No evidence of acute infarction, hemorrhage, hydrocephalus, extra-axial collection or mass lesion/mass effect. Vascular: No hyperdense vessel or unexpected calcification. Skull: Normal. Negative for fracture or focal lesion. Sinuses/Orbits: No acute finding. Other: Negative for scalp hematoma. IMPRESSION: No acute intracranial abnormality. Electronically Signed   By: Duanne Guess D.O.   On: 10/08/2022 09:39    Procedures Procedures    Medications Ordered in ED Medications  ketorolac (TORADOL) injection 60 mg (has no administration in time range)  acetaminophen (TYLENOL) tablet 1,000 mg (has no administration in time range)    ED Course/ Medical Decision Making/ A&P                             Medical Decision Making Carrie Guerra is a 37 y.o. female.  With PMH of asthma and migraines not on anticoagulation who presents with headache after walking into her freezer door yesterday.  Patient not on anticoagulation had no loss of consciousness and low mechanism head injury.  She still had head CT performed here which I personally reviewed no ICH or acute traumatic injury.  She is neurologically intact.  She has no traumatic injury visualized on exam, no laceration or hematoma.  Suspect she has headache secondary to traumatic injury and mild concussion.  Will give Toradol shot here with Tylenol.  Advised continued supportive care.  Discussed concussion precautions.  Advise close follow-up with PCP for reevaluation.  Strict return precautions discussed.  Discharged in stable condition.  Amount and/or Complexity of Data Reviewed Radiology: ordered.  Risk OTC drugs. Prescription drug management.      Final Clinical Impression(s) / ED Diagnoses Final diagnoses:  Closed head injury, initial encounter  Bad headache    Rx / DC  Orders ED Discharge Orders     None         Mardene Sayer, MD 10/08/22 [phone]

## 2022-10-08 NOTE — ED Triage Notes (Signed)
Pt was hit in the left side of her head/forehead yesterday with a large walkin freezer door closing on her. No LOC, presents today with swollen ,bruised left forehead. No neuro changes states her head is throbbing

## 2022-10-08 NOTE — Discharge Instructions (Addendum)
You were seen with a headache after a head injury.  Thankfully, your head CT was reassuring and normal and showed no concerning findings.  It is possible you may have a mild concussion.  Mental and physical rest are very important during the first days and weeks after a concussion to give yourself the best chance at a full and quick recovery.    If you are experiencing a headache, it is a sign that you are exerting themselves too much and too early in their recovery.  For the next 10-14 days, you should gradually re-introduce yourself into your normal routine, one step at a time.  Mental rest includes avoiding reading books, watching TV, or even listening to music if it gives you a headache.  Start by staying in bed or on the couch tomorrow, and gradually try to become more active throughout the day.  The same instructions are true for physical rest.  You should avoid any activity that worsens your headache, even if it simply walking fast.   Each day, you can try to become more active, but if you develop a headache, return to your reduced activity level from the day prior.  It is extremely important to avoid any activities that put yourself at risk for another head injury, especially including sports, gym class, or bike riding.  Another concussion during this critical recovery period can cause significant brain injury.  Please tell your school, work, coaches, about your need for physical and mental rest as limited by headaches.  Return to the emergency department if you develops any new and concerning symptoms including:  One pupil (the black part in the middle of the eye) larger than the other  Drowsiness or cannot be awakened  A new headache that gets worse and does not go away  Weakness, numbness, or decreased coordination  Repeated vomiting or nausea  Slurred speech  Convulsions or seizures  Difficulty recognizing people or places  Increasing confusion, restlessness, or agitation   Unusual behavior  For more information for parent's on guidelines concerning return to school, work, or sports, follow these links below:  [email or web address]

## 2022-12-27 ENCOUNTER — Encounter (HOSPITAL_COMMUNITY): Payer: Self-pay | Admitting: Emergency Medicine

## 2022-12-27 ENCOUNTER — Emergency Department (HOSPITAL_COMMUNITY)
Admission: EM | Admit: 2022-12-27 | Discharge: 2022-12-27 | Disposition: A | Discharge status: Home / Self Care | Payer: 59 | Attending: Emergency Medicine | Admitting: Emergency Medicine

## 2022-12-27 ENCOUNTER — Emergency Department (HOSPITAL_COMMUNITY): Payer: 59

## 2022-12-27 ENCOUNTER — Other Ambulatory Visit: Payer: Self-pay

## 2022-12-27 DIAGNOSIS — M79643 Pain in unspecified hand: Secondary | ICD-10-CM

## 2022-12-27 DIAGNOSIS — S6991XA Unspecified injury of right wrist, hand and finger(s), initial encounter: Secondary | ICD-10-CM | POA: Diagnosis present

## 2022-12-27 DIAGNOSIS — Y92410 Unspecified street and highway as the place of occurrence of the external cause: Secondary | ICD-10-CM | POA: Diagnosis not present

## 2022-12-27 NOTE — Discharge Instructions (Signed)
As we discussed, your workup in the ER today was reassuring for acute findings.  X-ray imaging of your wrist did not reveal any fracture or dislocation.  However, given the area of tenderness that you have, there is some concern that you could have a fracture that was missed on the initial imaging.  Given this, I have given you a splint that immobilizes your wrist and thumb and a referral to hand surgery with a number to call to schedule appointment for follow-up.  Please call them at your earliest convenience.  Return if development of any new or worsening symptoms.

## 2022-12-27 NOTE — Progress Notes (Signed)
Orthopedic Tech Progress Note Patient Details:  Carrie Guerra November 28, 1985 [id number]  Ortho Devices Type of Ortho Device: Thumb velcro splint Ortho Device/Splint Location: right velcro thumb spica splint applied Ortho Device/Splint Interventions: Ordered, Application, Adjustment   Post Interventions Patient Tolerated: Well Instructions Provided: Adjustment of device, Care of device  Kizzie Fantasia 12/27/2022, 11:55 AM
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
## 2023-01-22 ENCOUNTER — Emergency Department (HOSPITAL_COMMUNITY)
Admission: EM | Admit: 2023-01-22 | Discharge: 2023-01-22 | Discharge status: Against Medical Advice | Payer: 59 | Attending: Emergency Medicine | Admitting: Emergency Medicine

## 2023-01-22 ENCOUNTER — Encounter (HOSPITAL_COMMUNITY): Payer: Self-pay | Admitting: Emergency Medicine

## 2023-01-22 DIAGNOSIS — Z5329 Procedure and treatment not carried out because of patient's decision for other reasons: Secondary | ICD-10-CM | POA: Insufficient documentation

## 2023-01-22 DIAGNOSIS — R1084 Generalized abdominal pain: Secondary | ICD-10-CM | POA: Insufficient documentation

## 2023-01-22 DIAGNOSIS — R1032 Left lower quadrant pain: Secondary | ICD-10-CM | POA: Diagnosis present

## 2023-01-22 LAB — CBC WITH DIFFERENTIAL/PLATELET
Abs Immature Granulocytes: 0.03 10*3/uL (ref 0.00–0.07)
Basophils Absolute: 0 10*3/uL (ref 0.0–0.1)
Basophils Relative: 1 %
Eosinophils Absolute: 0 10*3/uL (ref 0.0–0.5)
Eosinophils Relative: 0 %
HCT: 33.1 % — ABNORMAL LOW (ref 36.0–46.0)
Hemoglobin: 10.7 g/dL — ABNORMAL LOW (ref 12.0–15.0)
Immature Granulocytes: 0 %
Lymphocytes Relative: 8 %
Lymphs Abs: 0.7 10*3/uL (ref 0.7–4.0)
MCH: 30.9 pg (ref 26.0–34.0)
MCHC: 32.3 g/dL (ref 30.0–36.0)
MCV: 95.7 fL (ref 80.0–100.0)
Monocytes Absolute: 0.5 10*3/uL (ref 0.1–1.0)
Monocytes Relative: 6 %
Neutro Abs: 7.4 10*3/uL (ref 1.7–7.7)
Neutrophils Relative %: 85 %
Platelets: 268 10*3/uL (ref 150–400)
RBC: 3.46 MIL/uL — ABNORMAL LOW (ref 3.87–5.11)
RDW: 11.6 % (ref 11.5–15.5)
WBC: 8.7 10*3/uL (ref 4.0–10.5)
nRBC: 0 % (ref 0.0–0.2)

## 2023-01-22 MED ORDER — ONDANSETRON HCL 4 MG/2ML IJ SOLN
4.0000 mg | Freq: Once | INTRAMUSCULAR | Status: AC
  Administered 2023-01-22: 4 mg via INTRAVENOUS
  Filled 2023-01-22: qty 2

## 2023-01-22 MED ORDER — SODIUM CHLORIDE 0.9 % IV BOLUS
1000.0000 mL | Freq: Once | INTRAVENOUS | Status: AC
  Administered 2023-01-22: 1000 mL via INTRAVENOUS

## 2023-01-22 NOTE — ED Triage Notes (Signed)
Pt BIB EMS from home, c/o left sided abdominal pain . Per EMS, feels like a twisting knot, pt screaming on the scene r/t 10/10 pain. Nausea vomit x1, 4mg  zofram, 150 mcg fentanyl.   BP 180/100 HR 84 RR 34  CBG 169 20 LH

## 2023-01-22 NOTE — ED Provider Notes (Signed)
[place] EMERGENCY DEPARTMENT AT [facility] Provider Note   CSN: [id number] Arrival date & time: 01/22/23  1119     History  Chief Complaint  Patient presents with   Abdominal Pain    Carrie Guerra is a 37 y.o. female history of hyperemesis syndrome, fibroids presenting with left lower quadrant pain that began this morning.  Patient dates that she has been puking all morning and has not been able to eat or drink.  Patient dates this happened in the past where she will get left lower quadrant pain with her hyperemesis and that her pain is very similar.  Patient denies any vaginal bleeding, chest pain, shortness of breath, change in sensation/motor skills, fevers, changes in bowel habits.  Patient states she still uses CBD and vapes.   Home Medications Prior to Admission medications   Medication Sig Start Date End Date Taking? Authorizing Provider  acetaminophen (TYLENOL) 325 MG tablet Take 650 mg by mouth every 6 (six) hours as needed for mild pain, fever or headache.    [provider]  azithromycin (ZITHROMAX) 250 MG tablet Take 1 tablet (250 mg total) by mouth daily. Take first 2 tablets together, then 1 every day until finished. 10/23/21   Ernie Avena, MD  ondansetron (ZOFRAN-ODT) 4 MG disintegrating tablet Take 1 tablet (4 mg total) by mouth every 8 (eight) hours as needed for nausea or vomiting. 10/23/21   Ernie Avena, MD  sucralfate (CARAFATE) 1 GM/10ML suspension Take 10 mLs (1 g total) by mouth 4 (four) times daily -  with meals and at bedtime. 12/22/20   Petrucelli, Samantha R, PA-C  pantoprazole (PROTONIX) 20 MG tablet Take 1 tablet (20 mg total) by mouth daily. Patient not taking: Reported on 12/28/2019 06/22/19 12/28/19  Muthersbaugh, Dahlia Client, PA-C  potassium chloride (KLOR-CON) 10 MEQ tablet Take 1 tablet (10 mEq total) by mouth 2 (two) times daily. Patient not taking: Reported on 12/28/2019 08/31/19 12/28/19  Glynn Octave, MD  prochlorperazine  (COMPAZINE) 25 MG suppository Place 1 suppository (25 mg total) rectally every 12 (twelve) hours as needed for nausea or vomiting. Patient not taking: Reported on 04/04/2017 11/08/16 06/22/19  Cristina Gong, PA-C      Allergies    Cabbage and Shrimp [shellfish allergy]    Review of Systems   Review of Systems  Physical Exam Updated Vital Signs BP (!) 153/91   Pulse 68   Temp 97.6 F (36.4 C) (Oral)   Resp 16   LMP 08/29/2019   SpO2 100%  Physical Exam Vitals reviewed.  Constitutional:      General: She is not in acute distress. HENT:     Head: Normocephalic and atraumatic.  Eyes:     Extraocular Movements: Extraocular movements intact.     Conjunctiva/sclera: Conjunctivae normal.     Pupils: Pupils are equal, round, and reactive to light.  Cardiovascular:     Rate and Rhythm: Normal rate and regular rhythm.     Pulses: Normal pulses.     Heart sounds: Normal heart sounds.     Comments: 2+ bilateral radial/dorsalis pedis pulses with regular rate Pulmonary:     Effort: Pulmonary effort is normal. No respiratory distress.     Breath sounds: Normal breath sounds.  Abdominal:     Palpations: Abdomen is soft.     Tenderness: There is abdominal tenderness (Generalized). There is no right CVA tenderness, left CVA tenderness, guarding or rebound.  Musculoskeletal:        General: Normal range  of motion.     Cervical back: Normal range of motion and neck supple.     Comments: 5 out of 5 bilateral grip/leg extension strength  Skin:    General: Skin is warm and dry.     Capillary Refill: Capillary refill takes less than 2 seconds.  Neurological:     General: No focal deficit present.     Mental Status: She is alert and oriented to person, place, and time.     Comments: Sensation intact in all 4 limbs  Psychiatric:        Mood and Affect: Mood normal.     ED Results / Procedures / Treatments   Labs (all labs ordered are listed, but only abnormal results are  displayed) Labs Reviewed  CBC WITH DIFFERENTIAL/PLATELET - Abnormal; Notable for the following components:      Result Value   RBC 3.46 (*)    Hemoglobin 10.7 (*)    HCT 33.1 (*)    All other components within normal limits  URINALYSIS, ROUTINE W REFLEX MICROSCOPIC  COMPREHENSIVE METABOLIC PANEL  LIPASE, BLOOD    EKG EKG Interpretation Date/Time:  Sunday January 22 2023 12:13:44 EDT Ventricular Rate:  71 PR Interval:  198 QRS Duration:  95 QT Interval:  430 QTC Calculation: 468 R Axis:   53  Text Interpretation: Sinus rhythm Baseline wander TECHNICALLY DIFFICULT ? flipped t waves in inferior leads Otherwise no significant change Confirmed by Melene Plan [phone]) on 01/22/2023 12:15:38 PM  Radiology No results found.  Procedures Procedures    Medications Ordered in ED Medications  ondansetron (ZOFRAN) injection 4 mg (4 mg Intravenous Given 01/22/23 1201)  sodium chloride 0.9 % bolus 1,000 mL (1,000 mLs Intravenous New Bag/Given 01/22/23 1202)    ED Course/ Medical Decision Making/ A&P                             Medical Decision Making Amount and/or Complexity of Data Reviewed Labs: ordered.  Risk Prescription drug management.   Carrie Guerra 37 y.o. presented today for abdominal pain. Working DDx that I considered at this time includes, but not limited to, gastroenteritis, colitis, small bowel obstruction, appendicitis, cholecystitis, pancreatitis, nephrolithiasis, AAA, UTI, pyelonephritis, ruptured ectopic pregnancy, PID, ovarian torsion.  R/o DDx: Cannot be fully assessed as patient left AMA  Review of prior external notes: 10/08/2022 ED  Unique Tests and My Interpretation:  CBC with differential: Unremarkable  Discussion with Independent Historian: None  Discussion of Management of Tests: None  Risk: Medium: prescription drug management  Risk Stratification Score: None  Plan: On exam patient was in no acute distress stable vitals.  Patient did have  generalized abdominal tenderness without peritoneal signs.  Patient states that in the past with her hyperemesis she will get left lower quadrant pain with nausea vomiting and upon chart review this does appear to be the case.  Patient was given Zofran and fluids for symptoms and abdominal labs will be obtained.  Patient stable at this time.  While rounding patient was seen walking around appearing asymptomatic requesting to be discharged despite not having blood test back yet.  Patient wants to leave against medical advice. Patient understands that his/her actions will lead to inadequate medical workup, and that he/she is at risk of complications of missed diagnosis, which includes morbidity and mortality.  Alternative options discussed Opportunity to change mind given  Patient is demonstrating good capacity to make decision. Patient understands that he/she  needs to return to the ER immediately if his/her symptoms get worse.  Patient stated that she does not need Zofran at home and she will follow-up with her primary care provider.          Final Clinical Impression(s) / ED Diagnoses Final diagnoses:  Generalized abdominal pain    Rx / DC Orders ED Discharge Orders     None         Remi Deter 01/22/23 1332    Melene Plan, DO 01/22/23 1332

## 2023-01-22 NOTE — ED Notes (Signed)
Pt refused vitals and advised she was leaving and would see her PCP.

## 2023-01-22 NOTE — Discharge Instructions (Signed)
Please follow-up with primary care provider regarding recent symptoms and ER visit.  At your request you are leaving AMA before receiving blood tests and we spoke about the risk of doing so.  You state you understand the risks of being discharged before receiving labs however if symptoms change or worsen please return to ER.

## 2023-05-17 DIAGNOSIS — K047 Periapical abscess without sinus: Secondary | ICD-10-CM | POA: Diagnosis not present

## 2023-05-17 DIAGNOSIS — R591 Generalized enlarged lymph nodes: Secondary | ICD-10-CM | POA: Diagnosis not present

## 2023-05-17 DIAGNOSIS — M542 Cervicalgia: Secondary | ICD-10-CM | POA: Diagnosis not present

## 2023-05-17 DIAGNOSIS — E871 Hypo-osmolality and hyponatremia: Secondary | ICD-10-CM | POA: Diagnosis not present

## 2023-05-17 DIAGNOSIS — J45909 Unspecified asthma, uncomplicated: Secondary | ICD-10-CM | POA: Diagnosis not present

## 2023-05-17 DIAGNOSIS — K122 Cellulitis and abscess of mouth: Secondary | ICD-10-CM | POA: Diagnosis not present

## 2023-05-17 DIAGNOSIS — K0889 Other specified disorders of teeth and supporting structures: Secondary | ICD-10-CM | POA: Diagnosis not present

## 2023-05-17 DIAGNOSIS — R1310 Dysphagia, unspecified: Secondary | ICD-10-CM | POA: Diagnosis not present

## 2023-05-18 DIAGNOSIS — R6884 Jaw pain: Secondary | ICD-10-CM | POA: Diagnosis not present

## 2023-05-18 DIAGNOSIS — K0530 Chronic periodontitis, unspecified: Secondary | ICD-10-CM | POA: Diagnosis not present

## 2023-05-18 DIAGNOSIS — K047 Periapical abscess without sinus: Secondary | ICD-10-CM | POA: Diagnosis not present

## 2023-05-18 DIAGNOSIS — R220 Localized swelling, mass and lump, head: Secondary | ICD-10-CM | POA: Diagnosis not present

## 2023-05-19 DIAGNOSIS — K0530 Chronic periodontitis, unspecified: Secondary | ICD-10-CM | POA: Diagnosis not present

## 2023-05-19 DIAGNOSIS — K047 Periapical abscess without sinus: Secondary | ICD-10-CM | POA: Diagnosis not present

## 2023-05-19 DIAGNOSIS — R1310 Dysphagia, unspecified: Secondary | ICD-10-CM | POA: Diagnosis not present

## 2023-05-19 DIAGNOSIS — R6884 Jaw pain: Secondary | ICD-10-CM | POA: Diagnosis not present

## 2023-05-19 DIAGNOSIS — K122 Cellulitis and abscess of mouth: Secondary | ICD-10-CM | POA: Diagnosis not present

## 2023-05-19 DIAGNOSIS — R220 Localized swelling, mass and lump, head: Secondary | ICD-10-CM | POA: Diagnosis not present

## 2023-09-07 ENCOUNTER — Encounter (HOSPITAL_COMMUNITY): Payer: Self-pay | Admitting: Emergency Medicine

## 2023-09-07 ENCOUNTER — Emergency Department (HOSPITAL_COMMUNITY): Payer: Self-pay

## 2023-09-07 ENCOUNTER — Other Ambulatory Visit: Payer: Self-pay

## 2023-09-07 ENCOUNTER — Emergency Department (HOSPITAL_COMMUNITY)
Admission: EM | Admit: 2023-09-07 | Discharge: 2023-09-07 | Discharge status: Against Medical Advice | Payer: Self-pay | Attending: Emergency Medicine | Admitting: Emergency Medicine

## 2023-09-07 DIAGNOSIS — G43809 Other migraine, not intractable, without status migrainosus: Secondary | ICD-10-CM | POA: Insufficient documentation

## 2023-09-07 DIAGNOSIS — Z5329 Procedure and treatment not carried out because of patient's decision for other reasons: Secondary | ICD-10-CM | POA: Insufficient documentation

## 2023-09-07 MED ORDER — DIPHENHYDRAMINE HCL 50 MG/ML IJ SOLN
12.5000 mg | Freq: Once | INTRAMUSCULAR | Status: AC
  Administered 2023-09-07: 12.5 mg via INTRAVENOUS
  Filled 2023-09-07: qty 1

## 2023-09-07 MED ORDER — SODIUM CHLORIDE 0.9 % IV BOLUS
1000.0000 mL | Freq: Once | INTRAVENOUS | Status: AC
  Administered 2023-09-07: 1000 mL via INTRAVENOUS

## 2023-09-07 MED ORDER — PROCHLORPERAZINE EDISYLATE 10 MG/2ML IJ SOLN
10.0000 mg | Freq: Once | INTRAMUSCULAR | Status: AC
  Administered 2023-09-07: 10 mg via INTRAVENOUS
  Filled 2023-09-07: qty 2

## 2023-09-07 NOTE — ED Provider Notes (Signed)
 [place] EMERGENCY DEPARTMENT AT [facility] Provider Note   CSN: [id number] Arrival date & time: 09/07/23  1539    History  Chief Complaint  Patient presents with   Migraine    Carrie Guerra is a 38 y.o. female for evaluation of headache.  History of migraine.  Does have a history of migraine however states has has had daily HA. HA to right side of HA. No fever, neck stiffness, blurred vision, numbness, weakness. Take ASA and Excedrin for pain PRN.  HPI     Home Medications Prior to Admission medications   Medication Sig Start Date End Date Taking? Authorizing Provider  acetaminophen (TYLENOL) 325 MG tablet Take 650 mg by mouth every 6 (six) hours as needed for mild pain, fever or headache.    [provider]  azithromycin (ZITHROMAX) 250 MG tablet Take 1 tablet (250 mg total) by mouth daily. Take first 2 tablets together, then 1 every day until finished. 10/23/21   Ernie Avena, MD  ondansetron (ZOFRAN-ODT) 4 MG disintegrating tablet Take 1 tablet (4 mg total) by mouth every 8 (eight) hours as needed for nausea or vomiting. 10/23/21   Ernie Avena, MD  sucralfate (CARAFATE) 1 GM/10ML suspension Take 10 mLs (1 g total) by mouth 4 (four) times daily -  with meals and at bedtime. 12/22/20   Petrucelli, Samantha R, PA-C  pantoprazole (PROTONIX) 20 MG tablet Take 1 tablet (20 mg total) by mouth daily. Patient not taking: Reported on 12/28/2019 06/22/19 12/28/19  Muthersbaugh, Dahlia Client, PA-C  potassium chloride (KLOR-CON) 10 MEQ tablet Take 1 tablet (10 mEq total) by mouth 2 (two) times daily. Patient not taking: Reported on 12/28/2019 08/31/19 12/28/19  Glynn Octave, MD  prochlorperazine (COMPAZINE) 25 MG suppository Place 1 suppository (25 mg total) rectally every 12 (twelve) hours as needed for nausea or vomiting. Patient not taking: Reported on 04/04/2017 11/08/16 06/22/19  Cristina Gong, PA-C      Allergies    Cabbage and Shrimp [shellfish allergy]     Review of Systems   Review of Systems  Constitutional: Negative.   HENT: Negative.    Respiratory: Negative.    Cardiovascular: Negative.   Gastrointestinal: Negative.   Genitourinary: Negative.   Musculoskeletal: Negative.   Skin: Negative.   Neurological:  Positive for headaches.  All other systems reviewed and are negative.   Physical Exam Updated Vital Signs BP 123/87   Pulse 93   Temp 98.4 F (36.9 C) (Oral)   Resp 14   LMP 08/29/2019   SpO2 99%  Physical Exam Vitals and nursing note reviewed.  Constitutional:      General: She is not in acute distress.    Appearance: She is well-developed. She is not ill-appearing, toxic-appearing or diaphoretic.  HENT:     Head: Normocephalic and atraumatic.     Nose: Nose normal.     Mouth/Throat:     Mouth: Mucous membranes are moist.  Eyes:     Pupils: Pupils are equal, round, and reactive to light.  Cardiovascular:     Rate and Rhythm: Normal rate.     Pulses: Normal pulses.     Heart sounds: Normal heart sounds.  Pulmonary:     Effort: Pulmonary effort is normal. No respiratory distress.     Breath sounds: Normal breath sounds.  Abdominal:     General: Bowel sounds are normal. There is no distension.     Palpations: Abdomen is soft.  Musculoskeletal:  General: Normal range of motion.     Cervical back: Normal range of motion.  Skin:    General: Skin is warm and dry.  Neurological:     General: No focal deficit present.     Mental Status: She is alert.     Cranial Nerves: Cranial nerves 2-12 are intact.     Sensory: Sensation is intact.     Motor: Motor function is intact.     Coordination: Coordination is intact.     Gait: Gait is intact.  Psychiatric:        Mood and Affect: Mood normal.     ED Results / Procedures / Treatments   Labs (all labs ordered are listed, but only abnormal results are displayed) Labs Reviewed - No data to display  EKG None  Radiology CT HEAD WO CONTRAST  ( ) Result Date: 09/07/2023 CLINICAL DATA:  Headache, increasing frequency or severity new daily HA EXAM: CT HEAD WITHOUT CONTRAST TECHNIQUE: Contiguous axial images were obtained from the base of the skull through the vertex without intravenous contrast. RADIATION DOSE REDUCTION: This exam was performed according to the departmental dose-optimization program which includes automated exposure control, adjustment of the mA and/or kV according to patient size and/or use of iterative reconstruction technique. COMPARISON:  CT head 10/08/2022 FINDINGS: Brain: No evidence of large-territorial acute infarction. No parenchymal hemorrhage. No mass lesion. No extra-axial collection. No mass effect or midline shift. No hydrocephalus. Basilar cisterns are patent. Vascular: No hyperdense vessel. Skull: No acute fracture or focal lesion. Sinuses/Orbits: Paranasal sinuses and mastoid air cells are clear. The orbits are unremarkable. Other: None. IMPRESSION: No acute intracranial abnormality. Electronically Signed   By: Tish Frederickson M.D.   On: 09/07/2023 19:56    Procedures Procedures    Medications Ordered in ED Medications  prochlorperazine (COMPAZINE) injection 10 mg (10 mg Intravenous Given 09/07/23 1719)  diphenhydrAMINE (BENADRYL) injection 12.5 mg (12.5 mg Intravenous Given 09/07/23 1718)  sodium chloride 0.9 % bolus 1,000 mL (0 mLs Intravenous Stopped 09/07/23 1845)   ED Course/ Medical Decision Making/ A&P   38 year old here for headache.  She has a history of migraines however most recently has been having almost daily headaches.  She has not had any head imaging previously.  She has a nonfocal neuroexam without deficits.  No head trauma.  Will plan on head imaging, migraine cocktail and reassess  Patient reassessed.  Headache resolved.  Pending CT read  Imaging personally viewed interpreted  Patient requesting leave prior to radiology reading head CT.  Unfortunately extended time wait time and  radiology reads.  She is choosing to leave AGAINST MEDICAL ADVICE.  We discussed the nature and purpose, risks and benefits, as well as, the alternatives of treatment. Time was given to allow the opportunity to ask questions and consider their options, and after the discussion, the patient decided to refuse the offerred treatment. The patient was informed that refusal could lead to, but was not limited to, death, permanent disability, or severe pain. If present, I asked the relatives or significant others to dissuade them without success. Prior to refusing, I determined that the patient had the capacity to make their decision and understood the consequences of that decision. After refusal, I made every reasonable opportunity to treat them to the best of my ability.  The patient was notified that they may return to the emergency department at any time for further treatment.  Medical Decision Making Amount and/or Complexity of Data Reviewed Independent Historian: friend External Data Reviewed: labs, radiology and notes. Radiology: ordered and independent interpretation performed. Decision-making details documented in ED Course.  Risk OTC drugs. Prescription drug management. Parenteral controlled substances. Decision regarding hospitalization. Diagnosis or treatment significantly limited by social determinants of health.          Final Clinical Impression(s) / ED Diagnoses Final diagnoses:  Other migraine without status migrainosus, not intractable    Rx / DC Orders ED Discharge Orders     None         Teasha Murrillo A, PA-C 09/07/23 2011    Lorre Nick, MD 09/08/23 1530

## 2023-09-07 NOTE — ED Triage Notes (Signed)
 Patient presents due to migrans and 2 "lumps" on her neck she believes are tight muscles. She has had migrans for "a long time." Patient is currently under a lot of stress.

## 2024-03-01 ENCOUNTER — Ambulatory Visit: Payer: Self-pay

## 2024-03-01 NOTE — Telephone Encounter (Signed)
 FYI Only or Action Required?: FYI only for provider.  Patient was last seen in primary care on Not yet established.  Called Nurse Triage reporting Migraine.  Symptoms began Chronic.  Interventions attempted: OTC medications: Excedrin.  Symptoms are: stable.  Triage Disposition: See PCP Within 2 Weeks  Patient/caregiver understands and will follow disposition?: Yes Reason for Disposition  Headache is a chronic symptom (recurrent or ongoing AND present > 4 weeks)  Answer Assessment - Initial Assessment Questions Excedrin last night, mild pain today. Patient has a visit on 9/26 to establish care with PCP  1. LOCATION: Where does it hurt?      Back of neck, feels a knot, towards front of temples and eyes  2. ONSET: When did the headache start? (e.g., minutes, hours, days)      Migraine leaving work last night 3. PATTERN: Does the pain come and go, or has it been constant since it started?     Constant since last night  4. SEVERITY: How bad is the pain? and What does it keep you from doing?  (e.g., Scale 1-10; mild, moderate, or severe)     Moderate  5. RECURRENT SYMPTOM: Have you ever had headaches before? If Yes, ask: When was the last time? and What happened that time?      Gets them at least 3-4 times a month  6. MIGRAINE: Have you been diagnosed with migraine headaches? If Yes, ask: Is this headache similar?      No, but has been to ER multiple times for them  7. HEAD INJURY: Has there been any recent injury to your head?      No  7. OTHER SYMPTOMS: Do you have any other symptoms? (e.g., fever, stiff neck, eye pain, sore throat, cold symptoms)     Nauseous comes and goes, happens when standing and experiencing pain.  Protocols used: [facility]  Copied from CRM [id number]. Topic: Clinical - Red Word Triage >> Mar 01, 2024 12:23 PM [name] wrote: Red Word that prompted transfer to Nurse Triage: Severe pain.  Pain in back of both eyes.  Suffers from  migraines.

## 2024-03-22 ENCOUNTER — Ambulatory Visit: Payer: Self-pay | Admitting: Family Medicine

## 2024-03-28 ENCOUNTER — Encounter: Payer: Self-pay | Admitting: Nurse Practitioner

## 2024-03-28 ENCOUNTER — Ambulatory Visit: Payer: Self-pay | Admitting: Nurse Practitioner

## 2024-03-28 ENCOUNTER — Telehealth: Payer: Self-pay | Admitting: Nurse Practitioner

## 2024-03-28 VITALS — BP 116/72 | HR 88 | Temp 98.3°F | Resp 18 | Ht 60.0 in | Wt 150.1 lb

## 2024-03-28 DIAGNOSIS — Z114 Encounter for screening for human immunodeficiency virus [HIV]: Secondary | ICD-10-CM

## 2024-03-28 DIAGNOSIS — G43709 Chronic migraine without aura, not intractable, without status migrainosus: Secondary | ICD-10-CM | POA: Insufficient documentation

## 2024-03-28 DIAGNOSIS — Z130 Encounter for screening for diseases of the blood and blood-forming organs and certain disorders involving the immune mechanism: Secondary | ICD-10-CM

## 2024-03-28 DIAGNOSIS — Z23 Encounter for immunization: Secondary | ICD-10-CM

## 2024-03-28 DIAGNOSIS — Z131 Encounter for screening for diabetes mellitus: Secondary | ICD-10-CM

## 2024-03-28 DIAGNOSIS — Z1159 Encounter for screening for other viral diseases: Secondary | ICD-10-CM

## 2024-03-28 DIAGNOSIS — Z13220 Encounter for screening for lipoid disorders: Secondary | ICD-10-CM

## 2024-03-28 DIAGNOSIS — G43E09 Chronic migraine with aura, not intractable, without status migrainosus: Secondary | ICD-10-CM

## 2024-03-28 DIAGNOSIS — Z7689 Persons encountering health services in other specified circumstances: Secondary | ICD-10-CM

## 2024-03-28 DIAGNOSIS — J4520 Mild intermittent asthma, uncomplicated: Secondary | ICD-10-CM

## 2024-03-28 MED ORDER — PROPRANOLOL HCL 10 MG PO TABS
10.0000 mg | ORAL_TABLET | Freq: Three times a day (TID) | ORAL | 1 refills | Status: AC
Start: 2024-03-28 — End: ?

## 2024-03-28 MED ORDER — SUMATRIPTAN SUCCINATE 50 MG PO TABS
50.0000 mg | ORAL_TABLET | ORAL | 1 refills | Status: AC | PRN
Start: 2024-03-28 — End: ?

## 2024-03-28 NOTE — Progress Notes (Signed)
 BP 116/72   Pulse 88   Temp 98.3 F (36.8 C)   Resp 18   Ht 5' (1.524 m)   Wt 150 lb 1.6 oz (68.1 kg)   LMP 08/29/2019   SpO2 98%   BMI 29.31 kg/m    Subjective:    Patient ID: Carrie Guerra, female    DOB: Dec 24, 1985, 38 y.o.   MRN: [id number]  HPI: Carrie Guerra is a 38 y.o. female  Chief Complaint  Patient presents with   Establish Care   Migraine    Getting worse, pain/swollen in back of head, sound and light sensitivity and nauseated   paperwork    Needs to see about getting FLMA for migraines   Discussed the use of AI scribe software for clinical note transcription with the patient, who gave verbal consent to proceed.  History of Present Illness Carrie Guerra is a 38 year old female who presents with worsening migraines and requests FMLA paperwork.  Migraine headaches - Migraines occur at least twice weekly with increasing frequency and intensity - Current migraine is mild but typically progresses to severe pain - Migraines have been present for many years, predating hysterectomy - Migraines were previously exacerbated by menstrual cramps - Aura is present with migraines, requiring use of glasses - No other significant symptoms reported  Migraine management and medication use - Uses over-the-counter medications including ibuprofen  and Excedrin for migraine relief - Excedrin intake has increased from two to four tablets per episode for adequate relief - When Excedrin is ineffective, seeks emergency care and receives 'migraine cocktail' - CT head performed on September 07, 2023 in the emergency department showed no acute intracranial abnormalities  Associated symptoms - Nausea accompanies migraines - Uses nausea chewable tablets for symptomatic relief - Has a supply of nausea tablets at home    EXAM: CT HEAD WITHOUT CONTRAST   TECHNIQUE: Contiguous axial images were obtained from the base of the skull through the vertex without intravenous contrast.    RADIATION DOSE REDUCTION: This exam was performed according to the departmental dose-optimization program which includes automated exposure control, adjustment of the mA and/or kV according to patient size and/or use of iterative reconstruction technique.   COMPARISON:  CT head 10/08/2022   FINDINGS: Brain:   No evidence of large-territorial acute infarction. No parenchymal hemorrhage. No mass lesion. No extra-axial collection.   No mass effect or midline shift. No hydrocephalus. Basilar cisterns are patent.   Vascular: No hyperdense vessel.   Skull: No acute fracture or focal lesion.   Sinuses/Orbits: Paranasal sinuses and mastoid air cells are clear. The orbits are unremarkable.   Other: None.   IMPRESSION: No acute intracranial abnormality.      03/28/2024   10:19 AM  Depression screen PHQ 2/9  Decreased Interest 0  Down, Depressed, Hopeless 0  PHQ - 2 Score 0  Altered sleeping 0  Tired, decreased energy 0  Change in appetite 0  Feeling bad or failure about yourself  0  Trouble concentrating 0  Moving slowly or fidgety/restless 0  Suicidal thoughts 0  PHQ-9 Score 0  Difficult doing work/chores Not difficult at all    Relevant past medical, surgical, family and social history reviewed and updated as indicated. Interim medical history since our last visit reviewed. Allergies and medications reviewed and updated.  Review of Systems  Ten systems reviewed and is negative except as mentioned in HPI      Objective:      BP 116/72   Pulse  88   Temp 98.3 F (36.8 C)   Resp 18   Ht 5' (1.524 m)   Wt 150 lb 1.6 oz (68.1 kg)   LMP 08/29/2019   SpO2 98%   BMI 29.31 kg/m    Wt Readings from Last 3 Encounters:  03/28/24 150 lb 1.6 oz (68.1 kg)  12/27/22 149 lb 14.6 oz (68 kg)  10/23/21 150 lb (68 kg)    Physical Exam GENERAL: Alert, cooperative, well developed, no acute distress HEENT: Normocephalic, normal oropharynx, moist mucous membranes CHEST:  Clear to auscultation bilaterally, No wheezes, rhonchi, or crackles CARDIOVASCULAR: Normal heart rate and rhythm, S1 and S2 normal without murmurs ABDOMEN: Soft, non-tender, non-distended, without organomegaly, Normal bowel sounds EXTREMITIES: No cyanosis or edema NEUROLOGICAL: Cranial nerves grossly intact, Moves all extremities without gross motor or sensory deficit  Results for orders placed or performed during the hospital encounter of 01/22/23  CBC with Differential   Collection Time: 01/22/23 12:00 PM  Result Value Ref Range   WBC 8.7 4.0 - 10.5 K/uL   RBC 3.46 (L) 3.87 - 5.11 MIL/uL   Hemoglobin 10.7 (L) 12.0 - 15.0 g/dL   HCT 66.8 (L) 63.9 - 53.9 %   MCV 95.7 80.0 - 100.0 fL   MCH 30.9 26.0 - 34.0 pg   MCHC 32.3 30.0 - 36.0 g/dL   RDW 88.3 88.4 - 84.4 %   Platelets 268 150 - 400 K/uL   nRBC 0.0 0.0 - 0.2 %   Neutrophils Relative % 85 %   Neutro Abs 7.4 1.7 - 7.7 K/uL   Lymphocytes Relative 8 %   Lymphs Abs 0.7 0.7 - 4.0 K/uL   Monocytes Relative 6 %   Monocytes Absolute 0.5 0.1 - 1.0 K/uL   Eosinophils Relative 0 %   Eosinophils Absolute 0.0 0.0 - 0.5 K/uL   Basophils Relative 1 %   Basophils Absolute 0.0 0.0 - 0.1 K/uL   Immature Granulocytes 0 %   Abs Immature Granulocytes 0.03 0.00 - 0.07 K/uL          Assessment & Plan:   Problem List Items Addressed This Visit       Cardiovascular and Mediastinum   Chronic migraine without aura without status migrainosus, not intractable - Primary   Relevant Medications   SUMAtriptan (IMITREX) 50 MG tablet   propranolol (INDERAL) 10 MG tablet     Respiratory   Mild intermittent asthma without complication   Other Visit Diagnoses       Immunization due         Encounter to establish care         Screening for deficiency anemia       Relevant Orders   CBC with Differential/Platelet     Screening for cholesterol level       Relevant Orders   Lipid panel     Screening for diabetes mellitus       Relevant Orders    Comprehensive metabolic panel with GFR   Hemoglobin A1c     Encounter for hepatitis C screening test for low risk patient       Relevant Orders   Hepatitis C antibody     Screening for HIV without presence of risk factors       Relevant Orders   HIV Antibody (routine testing w rflx)        Assessment and Plan Assessment & Plan Migraine, unspecified, not intractable, without status migrainosus Chronic migraines occurring at least twice a week with  increasing severity. Excedrin provides relief but requires increasing doses. Previous CT head showed no acute intracranial abnormality. Experiences nausea and aura with migraines. No current insurance, limiting treatment options. - Prescribe preventive medication to be taken three times a day - Prescribe Imitrex to be taken at onset of migraine, with a second dose after two hours if needed. - Advise use of GoodRx for medication pricing at [facility]. - Order lab work, with option to defer until insurance is obtained. -will send in FMLA paperwork  Unspecified asthma, uncomplicated Asthma is well-controlled with current use of albuterol  inhaler as needed.        Follow up plan: Return in about 3 months (around 06/28/2024) for follow up.

## 2024-03-28 NOTE — Telephone Encounter (Signed)
 Copied from CRM [id number]. Topic: General - Other >> Mar 28, 2024 11:33 AM Olam RAMAN wrote: Reason for CRM: \ Pt needs [employer] paperwork and off for work. Needs paperwork corrected. [facility]idations. Needs paperwork for time off for work for [employer]. CB [phone]. Needs a FMLA paperwork

## 2024-03-29 NOTE — Telephone Encounter (Signed)
 Left vm to send paperwork for FMLA vis mychart, fax [phone] or birng in and we can fill out

## 2024-06-28 ENCOUNTER — Ambulatory Visit: Payer: Self-pay | Admitting: Nurse Practitioner
